# Patient Record
Sex: Male | Born: 1955 | Race: White | Hispanic: No | Marital: Married | Smoking: Former smoker
Health system: Southern US, Community
[De-identification: ages and names within clinical notes are randomized; demographics above are authoritative.]

## PROBLEM LIST (undated history)

## (undated) DIAGNOSIS — G4730 Sleep apnea, unspecified: Secondary | ICD-10-CM

## (undated) DIAGNOSIS — I10 Essential (primary) hypertension: Secondary | ICD-10-CM

## (undated) DIAGNOSIS — E785 Hyperlipidemia, unspecified: Secondary | ICD-10-CM

## (undated) HISTORY — DX: Essential (primary) hypertension: I10

## (undated) HISTORY — PX: COLONOSCOPY: SHX174

## (undated) HISTORY — DX: Hyperlipidemia, unspecified: E78.5

---

## 2006-04-24 ENCOUNTER — Ambulatory Visit: Payer: Self-pay | Admitting: Otolaryngology

## 2011-06-13 ENCOUNTER — Encounter: Payer: Self-pay | Admitting: Internal Medicine

## 2011-06-13 ENCOUNTER — Ambulatory Visit (INDEPENDENT_AMBULATORY_CARE_PROVIDER_SITE_OTHER): Payer: BC Managed Care – PPO | Admitting: Internal Medicine

## 2011-06-13 VITALS — BP 130/72 | HR 72 | Temp 98.5°F | Resp 16 | Ht 73.0 in | Wt 239.5 lb

## 2011-06-13 DIAGNOSIS — E785 Hyperlipidemia, unspecified: Secondary | ICD-10-CM

## 2011-06-13 DIAGNOSIS — E669 Obesity, unspecified: Secondary | ICD-10-CM

## 2011-06-13 DIAGNOSIS — R5383 Other fatigue: Secondary | ICD-10-CM

## 2011-06-13 DIAGNOSIS — M25561 Pain in right knee: Secondary | ICD-10-CM

## 2011-06-13 DIAGNOSIS — Z1239 Encounter for other screening for malignant neoplasm of breast: Secondary | ICD-10-CM

## 2011-06-13 DIAGNOSIS — R5381 Other malaise: Secondary | ICD-10-CM

## 2011-06-13 DIAGNOSIS — M25569 Pain in unspecified knee: Secondary | ICD-10-CM

## 2011-06-13 DIAGNOSIS — I10 Essential (primary) hypertension: Secondary | ICD-10-CM

## 2011-06-13 DIAGNOSIS — Z79899 Other long term (current) drug therapy: Secondary | ICD-10-CM

## 2011-06-13 MED ORDER — FLUTICASONE PROPIONATE 50 MCG/ACT NA SUSP: 2.0000 | Freq: Every day | NASAL | Status: DC

## 2011-06-13 MED ORDER — SIMVASTATIN 20 MG PO TABS: 20.0000 mg | ORAL_TABLET | Freq: Every day | ORAL | Status: DC

## 2011-06-13 MED ORDER — BENAZEPRIL HCL 20 MG PO TABS: 20.0000 mg | ORAL_TABLET | Freq: Every day | ORAL | Status: DC

## 2011-06-13 MED ORDER — MELOXICAM 15 MG PO TABS: 15.0000 mg | ORAL_TABLET | Freq: Every day | ORAL | Status: DC

## 2011-06-13 MED ORDER — MELOXICAM 15 MG PO TABS: 15.0000 mg | ORAL_TABLET | Freq: Every day | ORAL | Status: AC

## 2011-06-13 MED ORDER — ZOLPIDEM TARTRATE 10 MG PO TABS: 10.0000 mg | ORAL_TABLET | Freq: Every evening | ORAL | Status: DC | PRN

## 2011-06-13 NOTE — Assessment & Plan Note (Addendum)
Right knee persistent for a few weeks.,  Some popliteal swelling noted on exam, anteriorly there is no  no large effusion.  wil obtain u/s  To r/o Bakers Cyst and DVT and try NSaIDs   aif no improvement ortho referral to [facility] .

## 2011-06-13 NOTE — Assessment & Plan Note (Signed)
I have addressed  Lipids, BMI and recommended a low glycemic index diet utilizing smaller more frequent meals to increase metabolism.  I have also recommended that patient start exercising with a goal of 30 minutes of aerobic exercise a minimum of 5 days per week. Screening for lipid disorders, thyroid and diabetes to be done today.

## 2011-06-13 NOTE — Progress Notes (Signed)
Patient ID: Walter Osborne, male   DOB: 1956-02-11, 56 y.o.   MRN: [id number]  Patient Active Problem List  Diagnoses  . Knee joint pain, right  . Other and unspecified hyperlipidemia  . Hypertension  . Obesity (BMI 30-39.9)    Subjective:  CC:   Chief Complaint  Patient presents with  . New Patient    HPI:   Walter Osborne a 56 y.o. male who presents for transfer of care from  [facility] for mgmt of hypertension, hyperlipidemia, and sleep apnea.  His OSA was  diagnosed with sleep study  4 or 5 years ago.  Did not tolerate CPAP, but tolerates  BIPAP on room air.  He has had no follow up study but contineus to wear it nightly and rests better with no daytime narcolepsy .  Regarding his blood pressure, he has a home monitor but does not check his bp at home .  He has some persistent right knee pain fore the past 4 weeks,  Made worse by a recent 2 hr ride to [place].  Kneee feels swollen and is tender .      History reviewed. No pertinent past medical history.  History reviewed. No pertinent past surgical history.       The following portions of the patient's history were reviewed and updated as appropriate: Allergies, current medications, and problem list.    Review of Systems:   12 Pt  review of systems was negative except those addressed in the HPI,     History   Social History  . Marital Status: Married    Spouse Name: N/A    Number of Children: N/A  . Years of Education: N/A   Occupational History  .      travels,     Social History Main Topics  . Smoking status: Former Smoker    Types: Cigarettes    Quit date: 02/13/1993  . Smokeless tobacco: Never Used  . Alcohol Use: 4.2 oz/week    7 Glasses of wine per week  . Drug Use: No  . Sexually Active: Not on file   Other Topics Concern  . Not on file   Social History Narrative  . No narrative on file    Objective:  BP 130/72  Pulse 72  Temp(Src) 98.5 F (36.9 C) (Oral)  Resp 16  Ht 6\' 1"   (1.854 m)  Wt 239 lb 8 oz (108.636 kg)  BMI 31.60 kg/m2  SpO2 96%  General appearance: alert, cooperative and appears stated age Ears: normal TM's and external ear canals both ears Throat: lips, mucosa, and tongue normal; teeth and gums normal Neck: no adenopathy, no carotid bruit, supple, symmetrical, trachea midline and thyroid not enlarged, symmetric, no tenderness/mass/nodules Back: symmetric, no curvature. ROM normal. No CVA tenderness. Lungs: clear to auscultation bilaterally Heart: regular rate and rhythm, S1, S2 normal, no murmur, click, rub or gallop Abdomen: soft, non-tender; bowel sounds normal; no masses,  no organomegaly Pulses: 2+ and symmetric Skin: Skin color, texture, turgor normal. No rashes or lesions Lymph nodes: Cervical, supraclavicular, and axillary nodes normal.  Assessment and Plan:  Knee joint pain, right Right knee persistent for a few weeks.,  Some popliteal swelling noted on exam, anteriorly there is no  no large effusion.  wil obtain u/s  To r/o Bakers Cyst and DVT and try NSaIDs   aif no improvement ortho referral to [facility] .  Hypertension Will have him check his bp a few days in a row to confirm  need for additonal meds.   Other and unspecified hyperlipidemia I have addressed  Lipids, BMI and recommended a low glycemic index diet utilizing smaller more frequent meals to increase metabolism.  I have also recommended that patient start exercising with a goal of 30 minutes of aerobic exercise a minimum of 5 days per week. Screening for lipid disorders, thyroid and diabetes to be done today.      Updated Medication List Outpatient Encounter Prescriptions as of 06/13/2011  Medication Sig Dispense Refill  . benazepril (LOTENSIN) 20 MG tablet Take 1 tablet (20 mg total) by mouth daily.  90 tablet  3  . fluticasone (FLONASE) 50 MCG/ACT nasal spray Place 2 sprays into the nose daily.  16 g  5  . meloxicam (MOBIC) 15 MG tablet Take 1 tablet (15 mg total) by  mouth daily.  90 tablet  3  . simvastatin (ZOCOR) 20 MG tablet Take 1 tablet (20 mg total) by mouth at bedtime.  90 tablet  3  . zolpidem (AMBIEN) 10 MG tablet Take 1 tablet (10 mg total) by mouth at bedtime as needed.  30 tablet  3  . DISCONTD: fluticasone (FLONASE) 50 MCG/ACT nasal spray Place 2 sprays into the nose daily.      Marland Kitchen DISCONTD: meloxicam (MOBIC) 15 MG tablet Take 1 tablet (15 mg total) by mouth daily.  30 tablet  1  . DISCONTD: simvastatin (ZOCOR) 10 MG tablet Take 10 mg by mouth at bedtime.      Marland Kitchen DISCONTD: zolpidem (AMBIEN) 10 MG tablet Take 10 mg by mouth at bedtime as needed.         Orders Placed This Encounter  Procedures  . US Venous Img Lower Unilateral Right  . TSH  . Lipid panel  . COMPLETE METABOLIC PANEL WITH GFR  . Ambulatory referral to Gastroenterology    No Follow-up on file.

## 2011-06-13 NOTE — Patient Instructions (Addendum)
Consider the Low Glycemic Index Diet and 6 smaller meals daily .  This boosts your metabolism and regulates your sugars:   7 AM Low carbohydrate Protein  Shakes (EAS Carb Control  Or Atkins ,  Available everywhere,   In  cases at BJs )  2.5 carbs  (Add or substitute a toasted sandwhich thin w/ peanut butter)  10 AM: Protein bar by Atkins (snack size,  Chocolate lover's variety at  BJ's)    Lunch: sandwich on pita bread or flatbread (Joseph's makes a pita bread and a flat bread , available at [employer] and BJ's; Toufayah makes a low carb flatbread available at [employer] and [employer]) Mission makes a low carb whole wheat tortilla available at [employer] most grocery stores   3 PM:  Mid day :  Another protein bar,  Or a  cheese stick, 1/4 cup of almonds, walnuts, pistachios, pecans, peanuts,  Macadamia nuts  6 PM  Dinner:  "mean and green:"  Meat/chicken/fish, salad, and green veggie : use ranch, vinagrette,  Blue cheese, etc  9 PM snack : Breyer's low carb fudgsicle or  ice cream bar (Carb Smart), or  Weight Watcher's ice cream bar , or another protein shake  --------------------------------------------------------------------------------------------------------------------------------------------------------------------------------- Schedule fasting labs at front desk   Return in 6 months for your physical   We will schedule an ultrasound of eight knee to rule out Bakers Cyst and DVT Trial of meloxicam 15 mg daily   and tylenol for pain control (tylenol max 2000 mg daily )   If no improvement in 2-3 weeks,.  Call for ortho referral  GI eval for colonoscopy to be set up by our office

## 2011-06-13 NOTE — Assessment & Plan Note (Signed)
Will have him check his bp a few days in a row to confirm need for additonal meds.

## 2011-06-19 ENCOUNTER — Ambulatory Visit: Payer: Self-pay | Admitting: Internal Medicine

## 2011-06-20 ENCOUNTER — Telehealth: Payer: Self-pay | Admitting: Internal Medicine

## 2011-06-20 NOTE — Telephone Encounter (Signed)
ultrasound was normal of the RLE>

## 2011-06-21 NOTE — Telephone Encounter (Signed)
Patient notified

## 2011-06-23 ENCOUNTER — Other Ambulatory Visit (INDEPENDENT_AMBULATORY_CARE_PROVIDER_SITE_OTHER): Payer: BC Managed Care – PPO | Admitting: *Deleted

## 2011-06-23 DIAGNOSIS — E785 Hyperlipidemia, unspecified: Secondary | ICD-10-CM

## 2011-06-23 DIAGNOSIS — I10 Essential (primary) hypertension: Secondary | ICD-10-CM

## 2011-06-23 LAB — LIPID PANEL
Cholesterol: 175 mg/dL (ref 0–200)
HDL: 48.9 mg/dL (ref >39.00)
Total CHOL/HDL Ratio: 4
Triglycerides: 130 mg/dL (ref 0.0–149.0)

## 2011-06-23 LAB — COMPREHENSIVE METABOLIC PANEL
ALT: 32 U/L (ref 0–53)
BUN: 18 mg/dL (ref 6–23)
CO2: 23 mEq/L (ref 19–32)
Calcium: 8.9 mg/dL (ref 8.4–10.5)
Chloride: 108 mEq/L (ref 96–112)
Creatinine, Ser: 1.1 mg/dL (ref 0.4–1.5)
GFR: 73.19 mL/min (ref >60.00)
Glucose, Bld: 93 mg/dL (ref 70–99)
Total Bilirubin: 0.9 mg/dL (ref 0.3–1.2)

## 2011-06-23 LAB — TSH: TSH: 1.32 u[IU]/mL (ref 0.35–5.50)

## 2011-06-26 ENCOUNTER — Encounter: Payer: Self-pay | Admitting: Internal Medicine

## 2011-06-27 ENCOUNTER — Telehealth: Payer: Self-pay | Admitting: Internal Medicine

## 2011-06-27 NOTE — Telephone Encounter (Signed)
Calling regarding tick bite to waist line, removed 06/26/11, no s/s reported, afebrile but states has quarter size area to tick bite, no rash. Saved tick. Home care given and will call back if s/s occur.

## 2011-06-29 ENCOUNTER — Ambulatory Visit (INDEPENDENT_AMBULATORY_CARE_PROVIDER_SITE_OTHER): Payer: BC Managed Care – PPO | Admitting: Internal Medicine

## 2011-06-29 ENCOUNTER — Encounter: Payer: Self-pay | Admitting: Internal Medicine

## 2011-06-29 VITALS — BP 128/71 | HR 72 | Temp 98.3°F | Resp 16 | Wt 242.0 lb

## 2011-06-29 DIAGNOSIS — T148 Other injury of unspecified body region: Secondary | ICD-10-CM

## 2011-06-29 DIAGNOSIS — G4700 Insomnia, unspecified: Secondary | ICD-10-CM

## 2011-06-29 DIAGNOSIS — W57XXXA Bitten or stung by nonvenomous insect and other nonvenomous arthropods, initial encounter: Secondary | ICD-10-CM

## 2011-06-29 DIAGNOSIS — T148XXA Other injury of unspecified body region, initial encounter: Secondary | ICD-10-CM

## 2011-06-29 MED ORDER — ZOLPIDEM TARTRATE 10 MG PO TABS: 10.0000 mg | ORAL_TABLET | Freq: Every evening | ORAL | Status: DC | PRN

## 2011-06-29 MED ORDER — DOXYCYCLINE HYCLATE 100 MG PO TABS: 100.0000 mg | ORAL_TABLET | Freq: Two times a day (BID) | ORAL | Status: AC

## 2011-06-29 NOTE — Assessment & Plan Note (Signed)
Erythema at site of tick bite is consistent with local reaction and possible early cellulitis. No signs or symptoms to suggest systemic infection such as RMSF. Will treat empirically with doxycycline twice daily for 14 days. Given the extent of bruising, and the patient is on meloxicam, will check platelet count with labs. Patient will call if any fever, chills, malaise, headache, or rash develops.

## 2011-06-29 NOTE — Progress Notes (Signed)
  Subjective:    Patient ID: Walter Osborne, male    DOB: 05-17-1955, 56 y.o.   MRN: [id number]  HPI 56 year old male presents for acute visit complaining of tick bite. He reports that on Monday of this week he noticed a tick over his left lower abdomen. His wife remove the tick. He has subsequently developed some redness at the site. He denies any fever, chills, headache, rash, malaise.  Outpatient Encounter Prescriptions as of 06/29/2011  Medication Sig Dispense Refill  . benazepril (LOTENSIN) 20 MG tablet Take 1 tablet (20 mg total) by mouth daily.  90 tablet  3  . doxycycline (VIBRA-TABS) 100 MG tablet Take 1 tablet (100 mg total) by mouth 2 (two) times daily.  28 tablet  0  . fluticasone (FLONASE) 50 MCG/ACT nasal spray Place 2 sprays into the nose daily.  16 g  5  . meloxicam (MOBIC) 15 MG tablet Take 1 tablet (15 mg total) by mouth daily.  90 tablet  3  . simvastatin (ZOCOR) 20 MG tablet Take 1 tablet (20 mg total) by mouth at bedtime.  90 tablet  3  . zolpidem (AMBIEN) 10 MG tablet Take 1 tablet (10 mg total) by mouth at bedtime as needed.  30 tablet  3  . DISCONTD: zolpidem (AMBIEN) 10 MG tablet Take 1 tablet (10 mg total) by mouth at bedtime as needed.  30 tablet  3    Review of Systems  Constitutional: Negative for fever, chills and fatigue.  Respiratory: Negative for cough and shortness of breath.   Cardiovascular: Negative for chest pain.  Gastrointestinal: Negative for abdominal pain.  Skin: Positive for color change and wound.  Neurological: Negative for headaches.   BP 128/71  Pulse 72  Temp(Src) 98.3 F (36.8 C) (Oral)  Resp 16  Wt 242 lb (109.77 kg)  SpO2 96%     Objective:   Physical Exam  Constitutional: He appears well-developed and well-nourished. No distress.  HENT:  Head: Normocephalic and atraumatic.  Eyes: Conjunctivae and EOM are normal. Pupils are equal, round, and reactive to light.  Neck: Normal range of motion.  Pulmonary/Chest: Effort normal.    Abdominal:    Skin: He is not diaphoretic.          Assessment & Plan:

## 2011-06-30 LAB — CBC WITH DIFFERENTIAL/PLATELET
Basophils Absolute: 0.1 10*3/uL (ref 0.0–0.1)
Eosinophils Relative: 2.2 % (ref 0.0–5.0)
HCT: 42 % (ref 39.0–52.0)
Hemoglobin: 14.4 g/dL (ref 13.0–17.0)
Lymphocytes Relative: 28.6 % (ref 12.0–46.0)
Monocytes Relative: 8.1 % (ref 3.0–12.0)
Neutro Abs: 3.7 10*3/uL (ref 1.4–7.7)
RDW: 13.2 % (ref 11.5–14.6)
WBC: 6.1 10*3/uL (ref 4.5–10.5)

## 2011-10-23 ENCOUNTER — Other Ambulatory Visit: Payer: Self-pay | Admitting: Internal Medicine

## 2011-10-23 DIAGNOSIS — G4700 Insomnia, unspecified: Secondary | ICD-10-CM

## 2011-10-23 MED ORDER — ZOLPIDEM TARTRATE 10 MG PO TABS: 10.0000 mg | ORAL_TABLET | Freq: Every evening | ORAL | Status: DC | PRN

## 2011-11-07 ENCOUNTER — Other Ambulatory Visit: Payer: Self-pay | Admitting: Internal Medicine

## 2011-11-07 DIAGNOSIS — G4700 Insomnia, unspecified: Secondary | ICD-10-CM

## 2011-11-07 MED ORDER — ZOLPIDEM TARTRATE 10 MG PO TABS: 10.0000 mg | ORAL_TABLET | Freq: Every evening | ORAL | Status: DC | PRN

## 2011-11-07 NOTE — Telephone Encounter (Signed)
Refill Ambien 10 mg. Patient does not have any more refills.

## 2011-11-08 ENCOUNTER — Telehealth: Payer: Self-pay | Admitting: Internal Medicine

## 2011-11-08 NOTE — Telephone Encounter (Signed)
Rx was called in yesterday.

## 2011-11-08 NOTE — Telephone Encounter (Signed)
Zolpidem tartrate 10 mg tab #30 take one tablet at bedtime

## 2012-03-27 ENCOUNTER — Other Ambulatory Visit: Payer: Self-pay | Admitting: *Deleted

## 2012-03-27 DIAGNOSIS — Z79899 Other long term (current) drug therapy: Secondary | ICD-10-CM

## 2012-03-27 DIAGNOSIS — G4700 Insomnia, unspecified: Secondary | ICD-10-CM

## 2012-03-27 DIAGNOSIS — Z125 Encounter for screening for malignant neoplasm of prostate: Secondary | ICD-10-CM

## 2012-03-27 DIAGNOSIS — E785 Hyperlipidemia, unspecified: Secondary | ICD-10-CM

## 2012-03-27 DIAGNOSIS — E039 Hypothyroidism, unspecified: Secondary | ICD-10-CM

## 2012-03-28 MED ORDER — ZOLPIDEM TARTRATE 10 MG PO TABS: 10.0000 mg | ORAL_TABLET | Freq: Every evening | ORAL | Status: DC | PRN

## 2012-03-28 NOTE — Telephone Encounter (Signed)
See unrouted note. 30 days on zopdem no refill.s Needs fasting labs prior to any more refills of bp meds and statin, and needs physical scheduled

## 2012-03-28 NOTE — Telephone Encounter (Signed)
30 days only.  He is overdue for his 6 month follow up and fasting labs. It is ow almost time foe his physical, if the physical has to be pushed out he needs to have the fasting labs done prior to any more refills

## 2012-04-01 NOTE — Telephone Encounter (Signed)
Med phoned in °

## 2012-04-22 ENCOUNTER — Ambulatory Visit (INDEPENDENT_AMBULATORY_CARE_PROVIDER_SITE_OTHER): Payer: BC Managed Care – PPO | Admitting: Internal Medicine

## 2012-04-22 ENCOUNTER — Encounter: Payer: Self-pay | Admitting: Internal Medicine

## 2012-04-22 VITALS — BP 118/78 | HR 71 | Temp 98.2°F | Resp 16 | Ht 73.5 in | Wt 243.5 lb

## 2012-04-22 DIAGNOSIS — Z125 Encounter for screening for malignant neoplasm of prostate: Secondary | ICD-10-CM

## 2012-04-22 DIAGNOSIS — E039 Hypothyroidism, unspecified: Secondary | ICD-10-CM

## 2012-04-22 DIAGNOSIS — Z1211 Encounter for screening for malignant neoplasm of colon: Secondary | ICD-10-CM

## 2012-04-22 DIAGNOSIS — Z79899 Other long term (current) drug therapy: Secondary | ICD-10-CM

## 2012-04-22 DIAGNOSIS — E669 Obesity, unspecified: Secondary | ICD-10-CM

## 2012-04-22 DIAGNOSIS — Z0000 Encounter for general adult medical examination without abnormal findings: Secondary | ICD-10-CM

## 2012-04-22 DIAGNOSIS — E785 Hyperlipidemia, unspecified: Secondary | ICD-10-CM

## 2012-04-22 DIAGNOSIS — I10 Essential (primary) hypertension: Secondary | ICD-10-CM

## 2012-04-22 LAB — COMPREHENSIVE METABOLIC PANEL
AST: 22 U/L (ref 0–37)
Albumin: 4.1 g/dL (ref 3.5–5.2)
Alkaline Phosphatase: 62 U/L (ref 39–117)
BUN: 24 mg/dL — ABNORMAL HIGH (ref 6–23)
Glucose, Bld: 80 mg/dL (ref 70–99)
Potassium: 3.8 mEq/L (ref 3.5–5.1)
Sodium: 141 mEq/L (ref 135–145)
Total Bilirubin: 0.6 mg/dL (ref 0.3–1.2)

## 2012-04-22 LAB — LIPID PANEL
Cholesterol: 152 mg/dL (ref 0–200)
LDL Cholesterol: 82 mg/dL (ref 0–99)
Total CHOL/HDL Ratio: 4
Triglycerides: 141 mg/dL (ref 0.0–149.0)
VLDL: 28.2 mg/dL (ref 0.0–40.0)

## 2012-04-22 NOTE — Assessment & Plan Note (Signed)
Well controlled on current regimen. Renal function stable, no changes today. 

## 2012-04-22 NOTE — Assessment & Plan Note (Signed)
I have addressed  BMI and recommended a low glycemic index diet utilizing smaller more frequent meals to increase metabolism.  I have also recommended that patient start exercising with a goal of 30 minutes of aerobic exercise a minimum of 5 days per week.  

## 2012-04-22 NOTE — Assessment & Plan Note (Signed)
Well controlled on current regimen. Liver enzymes are normal, no changes today. 

## 2012-04-22 NOTE — Assessment & Plan Note (Signed)
Annual male exam was done including testicular and prostate exam. PSA is pending .  Colon ca screening was recommended and referral made.  Screening labs done today

## 2012-04-22 NOTE — Patient Instructions (Addendum)
Your Body mass Index is 31.6  (Your goal is < 30.)  Insurance companies are considering rasing insurance rates on all pateitns with BMIs > 30 bc of the impact on overall health that high BMIs have (sleep anea,  Diabetes, hypertension, joint pain)  You can lower your BMI  by losing 10%  Of your current body weight over the next 6 months   This is  One version of a  "Low GI"  Diet:  It is not ultra low carb, but will still lower your blood sugars and allow you to lose 5 to 15lbs per month if you follow it carefully and combine it with 30 minutes of aeroric exercise 5 days per week .   All of the foods can be found at grocery stores and in bulk at [employer].  The Atkins protein bars and shakes are available in more varieties at Target, WalMart and [employer]'s Foods.     7 AM Breakfast:  Low carbohydrate Protein  Shakes (I recommend the EAS AdvantEdge "Carb Control" shakes  Or the low carb shakes by Atkins.   Both are available everywhere:  In  cases at BJs  Or in 4 packs at grocery stores and pharmacies  2.5 carbs  (Alternative is  a toasted Arnold's Sandwhich Thin w/ peanut butter, a "Bagel Thin" with cream cheese and salmon) or  a scrambled egg burrito made with a low carb tortilla .  Avoid cereal and bananas, oatmeal too unless you are cooking the old fashioned kind that takes 30-40 minutes to prepare.  the rest is overly processed, has minimal fiber, and is loaded with carbohydrates!   10 AM: Protein bar by Atkins (the snack size, under 200 cal).  There are many varieties , available widely again or in bulk in limited varieties at BJs)  Other so called "protein bars" tend to be loaded with carbohydrates.  Remember, in food advertising, the word "energy" is synonymous for " carbohydrate."  Lunch: sandwich of [place], (or any lunchmeat, grilled meat or canned tuna), fresh avocado, mayonnaise  and cheese on a lower carbohydrate pita bread, flatbread, or tortilla . Ok to use regular mayonnaise. The bread is the  only source or carbohydrate that can be decreased (Joseph's makes a pita bread and a flat bread that are 50 cal and 4 net carbs ; Toufayan makes a low carb flatbread that's 100 cal and 9 net carbs  and  Mission makes a low carb whole wheat tortilla  That is 210 cal and 6 net carbs)  3 PM:  Mid day :  Another protein bar,  Or a  cheese stick (100 cal, 0 carbs),  Or 1 ounce of  almonds, walnuts, pistachios, pecans, peanuts,  Macadamia nuts. Or a Dannon light n Fit greek yogurt, 80 cal 8 net carbs . Avoid "granola"; the dried cranberries and raisins are loaded with carbohydrates. Mixed nuts ok if no raisins or cranberries or dried fruit.      6 PM  Dinner:  "mean and green:"  Meat/chicken/fish or a high protein legume; , with a green salad, and a low GI  Veggie (broccoli, cauliflower, green beans, spinach, brussel sprouts. Lima beans) : Avoid "Low fat dressings, as well as Reyne Dumas and [address]! They are loaded with sugar! Instead use ranch, vinagrette,  Blue cheese, etc.  There is a low carb pasta by Dreamfield's available at [employer] [facility] that is acceptable and tastes great. Try Michel Angelo's chicken piccata over low carb pasta.  The chicken dish is 0 carbs, and can be found in frozen section at BJs and Lowe's. Also try [employer]" (pulled pork, no sauce,  0 carbs) and his pot roast.   both are in the refrigerated section at BJs   Dreamfield's makes a low carb pasta only 5 g/serving.  Available at all grocery stores,  And tastes like normal pasta  9 PM snack : Breyer's "low carb" fudgsicle or  ice cream bar (Carb Smart line), or  Weight Watcher's ice cream bar , or another "no sugar added" ice cream;a serving of fresh berries/cherries with whipped cream (Avoid bananas, pineapple, grapes  and watermelon on a regular basis because they are high in sugar)   Remember that snack Substitutions should be less than 10 carbs per serving and meals < 20 carbs. Remember to subtract fiber grams  and sugar alcohols to get the "net carbs."

## 2012-04-22 NOTE — Progress Notes (Signed)
Patient ID: Walter Osborne, male   DOB: 1955-11-03, 57 y.o.   MRN: [id number]   The patient is here for his annual male physical examination and management of other chronic and acute problems.   The risk factors are reflected in the social history.  The roster of all physicians providing medical care to patient - is listed in the Snapshot section of the chart.  Activities of daily living:  The patient is 100% independent in all ADLs: dressing, toileting, feeding as well as independent mobility  Home safety : The patient has smoke detectors in the home. He wears seatbelts.  There are no firearms at home. There is no violence in the home.   There is no risks for hepatitis, STDs or HIV. There is no   history of blood transfusion. There is no travel history to infectious disease endemic areas of the world.  The patient has seen their dentist in the last six month and  their eye doctor in the last year.  They do not  have excessive sun exposure. They have seen a dermatoloigist in the last year. Discussed the need for sun protection: hats, long sleeves and use of sunscreen if there is significant sun exposure.   Diet: the importance of a healthy diet is discussed. They do have a healthy diet.  The benefits of regular aerobic exercise were discussed. He exercises twice weekly Depression screen: there are no signs or vegative symptoms of depression- irritability, change in appetite, anhedonia, sadness/tearfullness.  The following portions of the patient's history were reviewed and updated as appropriate: allergies, current medications, past family history, past medical history,  past surgical history, past social history  and problem list.  Visual acuity was not assessed per patient preference since he has regular follow up with his ophthalmologist. Hearing and body mass index were assessed and reviewed.   During the course of the visit the patient was educated and counseled about appropriate screening  and preventive services including :  nutrition counseling, colorectal cancer screening, and recommended immunizations.       Objective  BP 118/78  Pulse 71  Temp(Src) 98.2 F (36.8 C) (Oral)  Resp 16  Ht 6' 1.5" (1.867 m)  Wt 243 lb 8 oz (110.451 kg)  BMI 31.69 kg/m2  SpO2 96%  General Appearance:    Alert, cooperative, no distress, appears stated age  Head:    Normocephalic, without obvious abnormality, atraumatic  Eyes:    PERRL, conjunctiva/corneas clear, EOM's intact, fundi    benign, both eyes       Ears:    Normal TM's and external ear canals, both ears  Nose:   Nares normal, septum midline, mucosa normal, no drainage   or sinus tenderness  Throat:   Lips, mucosa, and tongue normal; teeth and gums normal  Neck:   Supple, symmetrical, trachea midline, no adenopathy;       thyroid:  No enlargement/tenderness/nodules; no carotid   bruit or JVD  Back:     Symmetric, no curvature, ROM normal, no CVA tenderness  Lungs:     Clear to auscultation bilaterally, respirations unlabored  Chest wall:    No tenderness or deformity  Heart:    Regular rate and rhythm, S1 and S2 normal, no murmur, rub   or gallop  Abdomen:     Soft, non-tender, bowel sounds active all four quadrants,    no masses, no organomegaly  Genitalia:    Normal male without lesion, discharge or tenderness  Extremities:  Extremities normal, atraumatic, no cyanosis or edema  Pulses:   2+ and symmetric all extremities  Skin:   Erythematous nonpruritic rash left inguinal area Skin color, texture, turgor normal, no rashes or lesions  Lymph nodes:   Cervical, supraclavicular, and axillary nodes normal  Neurologic:   CNII-XII intact. Normal strength, sensation and reflexes      throughout    Assessment and Plan  Routine general medical examination at a health care facility Annual male exam was done including testicular and prostate exam. PSA is pending .  Colon ca screening was recommended and referral made.   Screening labs done today   Hypertension Well controlled on current regimen. Renal function stable, no changes today.  Hyperlipidemia Well controlled on current regimen. Liver enzymes are normal, no changes today.  Obesity (BMI 30-39.9) I have addressed  BMI and recommended a low glycemic index diet utilizing smaller more frequent meals to increase metabolism.  I have also recommended that patient start exercising with a goal of 30 minutes of aerobic exercise a minimum of 5 days per week.     Updated Medication List Outpatient Encounter Prescriptions as of 04/22/2012  Medication Sig Dispense Refill  . benazepril (LOTENSIN) 20 MG tablet Take 1 tablet (20 mg total) by mouth daily.  90 tablet  3  . fluticasone (FLONASE) 50 MCG/ACT nasal spray Place 2 sprays into the nose daily.  16 g  5  . Loratadine (ALAVERT PO) Take by mouth. 1 dissolvable tablet as needed daily for allergies.      . meloxicam (MOBIC) 15 MG tablet Take 1 tablet (15 mg total) by mouth daily.  90 tablet  3  . simvastatin (ZOCOR) 20 MG tablet Take 1 tablet (20 mg total) by mouth at bedtime.  90 tablet  3  . zolpidem (AMBIEN) 10 MG tablet Take 1 tablet (10 mg total) by mouth at bedtime as needed.  30 tablet  0   No facility-administered encounter medications on file as of 04/22/2012.

## 2012-04-25 ENCOUNTER — Other Ambulatory Visit: Payer: Self-pay | Admitting: *Deleted

## 2012-04-25 DIAGNOSIS — G4700 Insomnia, unspecified: Secondary | ICD-10-CM

## 2012-04-26 MED ORDER — FLUTICASONE PROPIONATE 50 MCG/ACT NA SUSP: 2.0000 | Freq: Every day | NASAL | Status: DC

## 2012-04-26 MED ORDER — ZOLPIDEM TARTRATE 10 MG PO TABS: 10.0000 mg | ORAL_TABLET | Freq: Every evening | ORAL | Status: DC | PRN

## 2012-04-26 NOTE — Telephone Encounter (Signed)
Rx phoned in.   

## 2012-04-26 NOTE — Telephone Encounter (Signed)
Ok to refill, am bien please call in  Authorized in epic

## 2012-05-24 ENCOUNTER — Other Ambulatory Visit: Payer: Self-pay | Admitting: *Deleted

## 2012-05-24 DIAGNOSIS — G4700 Insomnia, unspecified: Secondary | ICD-10-CM

## 2012-05-26 MED ORDER — ZOLPIDEM TARTRATE 10 MG PO TABS: 10.0000 mg | ORAL_TABLET | Freq: Every evening | ORAL | Status: DC | PRN

## 2012-05-26 NOTE — Telephone Encounter (Signed)
Ok to refill,  Authorized in epic. Please call in  

## 2012-05-27 NOTE — Telephone Encounter (Signed)
Script called in as instructed

## 2012-08-28 ENCOUNTER — Telehealth: Payer: Self-pay | Admitting: Internal Medicine

## 2012-08-28 NOTE — Telephone Encounter (Signed)
Refill Request  Benazepril 20 mg   #90  Take one tablet by mouth every day

## 2012-08-28 NOTE — Telephone Encounter (Signed)
Refill Request  Simvastatin 20 mg   #90  Take one tablet by mouth at bedtime

## 2012-08-28 NOTE — Telephone Encounter (Signed)
Pt needing refill on B/P meds Pt uses [facility] on [address].

## 2012-08-29 ENCOUNTER — Telehealth: Payer: Self-pay | Admitting: Internal Medicine

## 2012-08-29 MED ORDER — SIMVASTATIN 20 MG PO TABS: 20.0000 mg | ORAL_TABLET | Freq: Every day | ORAL | Status: DC

## 2012-08-29 MED ORDER — BENAZEPRIL HCL 20 MG PO TABS: 20.0000 mg | ORAL_TABLET | Freq: Every day | ORAL | Status: DC

## 2012-08-29 NOTE — Telephone Encounter (Signed)
Reordered BP Med but was Simvastatin D/C, last lipid normal 3/14 please advise.

## 2012-08-29 NOTE — Telephone Encounter (Signed)
The patient is completely out of his blood pressure medication and needing a refill today.

## 2012-08-29 NOTE — Telephone Encounter (Signed)
error 

## 2012-08-29 NOTE — Telephone Encounter (Signed)
Refills sent

## 2012-08-29 NOTE — Telephone Encounter (Signed)
i see no evidence that I dc'd it ,  To the contrary per office note, but apparently EPIC dc's the medication from the list if the refill date passes without a refill? Refill authorized

## 2012-11-29 ENCOUNTER — Other Ambulatory Visit: Payer: Self-pay | Admitting: Internal Medicine

## 2012-11-29 DIAGNOSIS — Z79899 Other long term (current) drug therapy: Secondary | ICD-10-CM

## 2012-11-29 DIAGNOSIS — E785 Hyperlipidemia, unspecified: Secondary | ICD-10-CM

## 2012-11-29 NOTE — Telephone Encounter (Signed)
Okay to refill? 

## 2012-11-29 NOTE — Telephone Encounter (Signed)
Refill one 30 days only.  He is overdue for 6 month follow up on hypertension and lipid management and needs fasting labs prior to visit. Marland Kitchen

## 2012-12-04 ENCOUNTER — Encounter: Payer: Self-pay | Admitting: *Deleted

## 2012-12-04 NOTE — Telephone Encounter (Signed)
Mailed pt a letter & asked him to call office for OV & lab appt for any additional refills

## 2012-12-09 ENCOUNTER — Encounter (INDEPENDENT_AMBULATORY_CARE_PROVIDER_SITE_OTHER): Payer: Self-pay

## 2012-12-09 ENCOUNTER — Other Ambulatory Visit (INDEPENDENT_AMBULATORY_CARE_PROVIDER_SITE_OTHER): Payer: BC Managed Care – PPO

## 2012-12-09 DIAGNOSIS — E785 Hyperlipidemia, unspecified: Secondary | ICD-10-CM

## 2012-12-09 DIAGNOSIS — Z79899 Other long term (current) drug therapy: Secondary | ICD-10-CM

## 2012-12-09 LAB — LIPID PANEL
Cholesterol: 218 mg/dL — ABNORMAL HIGH (ref 0–200)
Triglycerides: 140 mg/dL (ref 0.0–149.0)

## 2012-12-09 LAB — COMPREHENSIVE METABOLIC PANEL
Albumin: 4.1 g/dL (ref 3.5–5.2)
BUN: 17 mg/dL (ref 6–23)
Calcium: 9 mg/dL (ref 8.4–10.5)
Chloride: 106 mEq/L (ref 96–112)
Creatinine, Ser: 1.2 mg/dL (ref 0.4–1.5)
GFR: 68.5 mL/min (ref >60.00)
Glucose, Bld: 105 mg/dL — ABNORMAL HIGH (ref 70–99)
Potassium: 4.4 mEq/L (ref 3.5–5.1)

## 2012-12-11 ENCOUNTER — Encounter: Payer: Self-pay | Admitting: *Deleted

## 2012-12-13 ENCOUNTER — Encounter: Payer: Self-pay | Admitting: *Deleted

## 2012-12-16 ENCOUNTER — Encounter: Payer: Self-pay | Admitting: Internal Medicine

## 2012-12-16 ENCOUNTER — Ambulatory Visit (INDEPENDENT_AMBULATORY_CARE_PROVIDER_SITE_OTHER): Payer: BC Managed Care – PPO | Admitting: Internal Medicine

## 2012-12-16 VITALS — BP 138/88 | HR 69 | Temp 98.5°F | Resp 12 | Wt 241.0 lb

## 2012-12-16 DIAGNOSIS — I10 Essential (primary) hypertension: Secondary | ICD-10-CM

## 2012-12-16 DIAGNOSIS — Z23 Encounter for immunization: Secondary | ICD-10-CM

## 2012-12-16 DIAGNOSIS — E785 Hyperlipidemia, unspecified: Secondary | ICD-10-CM

## 2012-12-16 DIAGNOSIS — Z79899 Other long term (current) drug therapy: Secondary | ICD-10-CM

## 2012-12-16 DIAGNOSIS — G4733 Obstructive sleep apnea (adult) (pediatric): Secondary | ICD-10-CM

## 2012-12-16 MED ORDER — ZOLPIDEM TARTRATE 10 MG PO TABS: ORAL_TABLET | ORAL | Status: DC

## 2012-12-16 MED ORDER — TETANUS-DIPHTH-ACELL PERTUSSIS 5-2.5-18.5 LF-MCG/0.5 IM SUSP: 0.5000 mL | Freq: Once | INTRAMUSCULAR | Status: DC

## 2012-12-16 MED ORDER — ATORVASTATIN CALCIUM 40 MG PO TABS: 40.0000 mg | ORAL_TABLET | Freq: Every day | ORAL | Status: DC

## 2012-12-16 NOTE — Assessment & Plan Note (Addendum)
.  Diagnosed by sleep study. he is wearing her BIPAP every night a minimum of 6 hours per night.

## 2012-12-16 NOTE — Patient Instructions (Addendum)
Changing statIn from simvastatin to atorvastatin to lower your risk for Coronary artery disease.   RETURN FOR FASTING LABS PRIOR TO NEXT REFILL (within 90 days)   Your need your tetanus-diptheria-pertussis vaccine (TDaP) but you can get it for less $$$ at a local pharmacy with the script I have provided you.    Your blood pressure is elevated today.   Please recheck bp at home a minimum of 5 times over the next 4 weeks and call readings to office or e mail them to me  for adjustment of medications.

## 2012-12-16 NOTE — Assessment & Plan Note (Addendum)
Elevated despite use of simvastatin  His risk of CAD using Framingham risk calculator is 20% using current fasting lipids (on simvastatin).  We discussed  Changing to a more potent statin.  Generic atorvastatin,  Repeat lipids in 3 months

## 2012-12-16 NOTE — Assessment & Plan Note (Signed)
Elevated today.  Reviewed list of meds, patient is not taking OTC meds that could be causing,. It.  Have asked patient to recheck bp at home a minimum of 5 times over the next 4 weeks and call readings to office for adjustment of medications.   

## 2012-12-16 NOTE — Progress Notes (Signed)
Patient ID: Walter Osborne, male   DOB: September 23, 1955, 57 y.o.   MRN: [id number]  Patient Active Problem List   Diagnosis Date Noted  . OSA on CPAP 12/16/2012  . Routine general medical examination at a health care facility 04/22/2012  . Knee joint pain, right 06/13/2011  . Hypertension 06/13/2011  . Obesity (BMI 30-39.9) 06/13/2011  . Hyperlipidemia     Subjective:  CC:   Chief Complaint  Patient presents with  . Follow-up    HPI:   Walter Osborne a 56 y.o. male who presents for follow up on hypertension and hyperlipidemia. He has been taking his medications wihout side effects. Exercising regularly . No new complaints. Wearing his CPAP every night. ,  Sleeps at least 6 hours per night, cleaning  it regularly , fatigue has improved.  Diet reviewed, is healthy,  Uses treadmill twice per week.   Past Medical History  Diagnosis Date  . Hyperlipidemia   . Hypertension     History reviewed. No pertinent past surgical history.     The following portions of the patient's history were reviewed and updated as appropriate: Allergies, current medications, and problem list.    Review of Systems:   12 Pt  review of systems was negative except those addressed in the HPI,     History   Social History  . Marital Status: Married    Spouse Name: N/A    Number of Children: N/A  . Years of Education: N/A   Occupational History  .      travels,     Social History Main Topics  . Smoking status: Former Smoker    Types: Cigarettes    Quit date: 02/13/1993  . Smokeless tobacco: Never Used  . Alcohol Use: 4.2 oz/week    7 Glasses of wine per week  . Drug Use: No  . Sexual Activity: Not on file   Other Topics Concern  . Not on file   Social History Narrative  . No narrative on file    Objective:  Filed Vitals:   12/16/12 1607  BP: 138/88  Pulse: 69  Temp: 98.5 F (36.9 C)  Resp: 12     General appearance: alert, cooperative and appears stated age Ears: normal  TM's and external ear canals both ears Throat: lips, mucosa, and tongue normal; teeth and gums normal Neck: no adenopathy, no carotid bruit, supple, symmetrical, trachea midline and thyroid not enlarged, symmetric, no tenderness/mass/nodules Back: symmetric, no curvature. ROM normal. No CVA tenderness. Lungs: clear to auscultation bilaterally Heart: regular rate and rhythm, S1, S2 normal, no murmur, click, rub or gallop Abdomen: soft, non-tender; bowel sounds normal; no masses,  no organomegaly Pulses: 2+ and symmetric Skin: Skin color, texture, turgor normal. No rashes or lesions Lymph nodes: Cervical, supraclavicular, and axillary nodes normal.  Assessment and Plan:  Hyperlipidemia Elevated despite use of simvastatin  His risk of CAD using Framingham risk calculator is 20% using current fasting lipids (on simvastatin).  We discussed  Changing to a more potent statin.  Generic atorvastatin,  Repeat lipids in 3 months   Hypertension Elevated today.  Reviewed list of meds, patient is not taking OTC meds that could be causing,. It.  Have asked patient to recheck bp at home a minimum of 5 times over the next 4 weeks and call readings to office for adjustment of medications.    OSA on CPAP .Diagnosed by sleep study. he is wearing her BIPAP every night a minimum of 6 hours per  night.    Updated Medication List Outpatient Encounter Prescriptions as of 12/16/2012  Medication Sig  . benazepril (LOTENSIN) 20 MG tablet Take 1 tablet (20 mg total) by mouth daily.  . fluticasone (FLONASE) 50 MCG/ACT nasal spray USE 2 SPRAYS EACH NOSTRIL DAILY  . Loratadine (ALAVERT PO) Take by mouth. 1 dissolvable tablet as needed daily for allergies.  Marland Kitchen zolpidem (AMBIEN) 10 MG tablet TAKE ONE (1) TABLET AT BEDTIME  . [DISCONTINUED] simvastatin (ZOCOR) 20 MG tablet Take 1 tablet (20 mg total) by mouth at bedtime.  . [DISCONTINUED] zolpidem (AMBIEN) 10 MG tablet TAKE ONE (1) TABLET AT BEDTIME  . atorvastatin  (LIPITOR) 40 MG tablet Take 1 tablet (40 mg total) by mouth daily.  . TDaP (BOOSTRIX) 5-2.5-18.5 LF-MCG/0.5 injection Inject 0.5 mLs into the muscle once.     Orders Placed This Encounter  Procedures  . Lipid panel  . Comprehensive metabolic panel    No Follow-up on file.

## 2012-12-30 ENCOUNTER — Other Ambulatory Visit: Payer: Self-pay | Admitting: Internal Medicine

## 2013-01-06 ENCOUNTER — Other Ambulatory Visit: Payer: BC Managed Care – PPO

## 2013-02-21 ENCOUNTER — Encounter: Payer: Self-pay | Admitting: Internal Medicine

## 2013-02-21 ENCOUNTER — Ambulatory Visit (INDEPENDENT_AMBULATORY_CARE_PROVIDER_SITE_OTHER): Payer: BC Managed Care – PPO | Admitting: Internal Medicine

## 2013-02-21 VITALS — BP 130/84 | HR 65 | Temp 98.0°F | Wt 238.2 lb

## 2013-02-21 DIAGNOSIS — J209 Acute bronchitis, unspecified: Secondary | ICD-10-CM

## 2013-02-21 MED ORDER — HYDROCODONE-HOMATROPINE 5-1.5 MG/5ML PO SYRP: 5.0000 mL | ORAL_SOLUTION | Freq: Three times a day (TID) | ORAL | Status: DC | PRN

## 2013-02-21 MED ORDER — AZITHROMYCIN 250 MG PO TABS
ORAL_TABLET | ORAL | Status: DC
Start: 2013-02-21 — End: 2013-07-21

## 2013-02-21 NOTE — Progress Notes (Signed)
Pre-visit discussion using our clinic review tool. No additional management support is needed unless otherwise documented below in the visit note.  

## 2013-02-21 NOTE — Patient Instructions (Signed)
Acute Bronchitis Bronchitis is inflammation of the airways that extend from the windpipe into the lungs (bronchi). The inflammation often causes mucus to develop. This leads to a cough, which is the most common symptom of bronchitis.  In acute bronchitis, the condition usually develops suddenly and goes away over time, usually in a couple weeks. Smoking, allergies, and asthma can make bronchitis worse. Repeated episodes of bronchitis may cause further lung problems.  CAUSES Acute bronchitis is most often caused by the same virus that causes a cold. The virus can spread from person to person (contagious).  SIGNS AND SYMPTOMS   Cough.   Fever.   Coughing up mucus.   Body aches.   Chest congestion.   Chills.   Shortness of breath.   Sore throat.  DIAGNOSIS  Acute bronchitis is usually diagnosed through a physical exam. Tests, such as chest X-rays, are sometimes done to rule out other conditions.  TREATMENT  Acute bronchitis usually goes away in a couple weeks. Often times, no medical treatment is necessary. Medicines are sometimes given for relief of fever or cough. Antibiotics are usually not needed but may be prescribed in certain situations. In some cases, an inhaler may be recommended to help reduce shortness of breath and control the cough. A cool mist vaporizer may also be used to help thin bronchial secretions and make it easier to clear the chest.  HOME CARE INSTRUCTIONS  Get plenty of rest.   Drink enough fluids to keep your urine clear or pale yellow (unless you have a medical condition that requires fluid restriction). Increasing fluids may help thin your secretions and will prevent dehydration.   Only take over-the-counter or prescription medicines as directed by your health care provider.   Avoid smoking and secondhand smoke. Exposure to cigarette smoke or irritating chemicals will make bronchitis worse. If you are a smoker, consider using nicotine gum or skin  patches to help control withdrawal symptoms. Quitting smoking will help your lungs heal faster.   Reduce the chances of another bout of acute bronchitis by washing your hands frequently, avoiding people with cold symptoms, and trying not to touch your hands to your mouth, nose, or eyes.   Follow up with your health care provider as directed.  SEEK MEDICAL CARE IF: Your symptoms do not improve after 1 week of treatment.  SEEK IMMEDIATE MEDICAL CARE IF:  You develop an increased fever or chills.   You have chest pain.   You have severe shortness of breath.  You have bloody sputum.   You develop dehydration.  You develop fainting.  You develop repeated vomiting.  You develop a severe headache. MAKE SURE YOU:   Understand these instructions.  Will watch your condition.  Will get help right away if you are not doing well or get worse. Document Released: 03/09/2004 Document Revised: 10/02/2012 Document Reviewed: 07/23/2012 Exit[employer] Patient Information 2014 [employer].  

## 2013-02-21 NOTE — Progress Notes (Signed)
HPI  Pt presents to the clinic today with c/o cough and chills. This started 1 week ago. The cough is productive of thick green mucous. The cough is worse at night. He has had coughing fits. He also c/o nasal congestion, sore throat and headache. He has tried Theraflu OTC. He has had sick contacts. He has no history of allergies or breathing problems. He does not smoke. He did get his flu shot.  Review of Systems      Past Medical History  Diagnosis Date  . Hyperlipidemia   . Hypertension     Family History  Problem Relation Age of Onset  . Hyperlipidemia Mother   . Diabetes Father   . Cancer Son     Lung CA    History   Social History  . Marital Status: Married    Spouse Name: N/A    Number of Children: N/A  . Years of Education: N/A   Occupational History  .      travels,     Social History Main Topics  . Smoking status: Former Smoker    Types: Cigarettes    Quit date: 02/13/1993  . Smokeless tobacco: Never Used  . Alcohol Use: 4.2 oz/week    7 Glasses of wine per week  . Drug Use: No  . Sexual Activity: Not on file   Other Topics Concern  . Not on file   Social History Narrative  . No narrative on file    No Known Allergies   Constitutional: Positive headache, fatigue and fever. Denies abrupt weight changes.  HEENT:  Positive sore throat. Denies eye redness, eye pain, pressure behind the eyes, facial pain, nasal congestion, ear pain, ringing in the ears, wax buildup, runny nose or bloody nose. Respiratory: Positive cough. Denies difficulty breathing or shortness of breath.  Cardiovascular: Denies chest pain, chest tightness, palpitations or swelling in the hands or feet.   No other specific complaints in a complete review of systems (except as listed in HPI above).  Objective:   BP 130/84  Pulse 65  Temp(Src) 98 F (36.7 C) (Oral)  Wt 238 lb 4 oz (108.069 kg)  SpO2 98% Wt Readings from Last 3 Encounters:  02/21/13 238 lb 4 oz (108.069 kg)   12/16/12 241 lb (109.317 kg)  04/22/12 243 lb 8 oz (110.451 kg)     General: Appears his stated age, well developed, well nourished in NAD. HEENT: Head: normal shape and size; Eyes: sclera white, no icterus, conjunctiva pink, PERRLA and EOMs intact; Ears: Tm's gray and intact, normal light reflex; Nose: mucosa pink and moist, septum midline; Throat/Mouth: + PND. Teeth present, mucosa erythematous and moist, no exudate noted, no lesions or ulcerations noted.  Neck: Mild cervical lymphadenopathy. Neck supple, trachea midline. No massses, lumps or thyromegaly present.  Cardiovascular: Normal rate and rhythm. S1,S2 noted.  No murmur, rubs or gallops noted. No JVD or BLE edema. No carotid bruits noted. Pulmonary/Chest: Normal effort and positive vesicular breath sounds. No respiratory distress. No wheezes, rales or ronchi noted.      Assessment & Plan:   Acute Bronchitis  Get some rest and drink plenty of water Do salt water gargles for the sore throat eRx for Azithromax x 5 days eRx for Hycodan cough syrup  RTC as needed or if symptoms persist.

## 2013-02-24 ENCOUNTER — Other Ambulatory Visit (INDEPENDENT_AMBULATORY_CARE_PROVIDER_SITE_OTHER): Payer: BC Managed Care – PPO

## 2013-02-24 DIAGNOSIS — Z79899 Other long term (current) drug therapy: Secondary | ICD-10-CM

## 2013-02-24 DIAGNOSIS — E785 Hyperlipidemia, unspecified: Secondary | ICD-10-CM

## 2013-02-24 LAB — LIPID PANEL
CHOL/HDL RATIO: 4
Cholesterol: 137 mg/dL (ref 0–200)
HDL: 36.4 mg/dL — ABNORMAL LOW (ref >39.00)
LDL CALC: 80 mg/dL (ref 0–99)
Triglycerides: 105 mg/dL (ref 0.0–149.0)
VLDL: 21 mg/dL (ref 0.0–40.0)

## 2013-02-24 LAB — COMPREHENSIVE METABOLIC PANEL
ALBUMIN: 3.9 g/dL (ref 3.5–5.2)
ALT: 27 U/L (ref 0–53)
AST: 17 U/L (ref 0–37)
Alkaline Phosphatase: 55 U/L (ref 39–117)
BUN: 17 mg/dL (ref 6–23)
CO2: 27 mEq/L (ref 19–32)
Calcium: 9.4 mg/dL (ref 8.4–10.5)
Chloride: 109 mEq/L (ref 96–112)
Creatinine, Ser: 1.1 mg/dL (ref 0.4–1.5)
GFR: 71.26 mL/min (ref >60.00)
Glucose, Bld: 101 mg/dL — ABNORMAL HIGH (ref 70–99)
POTASSIUM: 4.4 meq/L (ref 3.5–5.1)
Sodium: 142 mEq/L (ref 135–145)
Total Bilirubin: 0.8 mg/dL (ref 0.3–1.2)
Total Protein: 6.7 g/dL (ref 6.0–8.3)

## 2013-02-27 ENCOUNTER — Encounter: Payer: Self-pay | Admitting: *Deleted

## 2013-05-29 ENCOUNTER — Other Ambulatory Visit: Payer: Self-pay | Admitting: Internal Medicine

## 2013-05-29 NOTE — Telephone Encounter (Signed)
Electronic Rx request, please advise. 

## 2013-05-29 NOTE — Telephone Encounter (Signed)
Refill one 30 days only.  Needs 6 month follow up ni May prior to any more refills

## 2013-07-02 ENCOUNTER — Emergency Department: Payer: Self-pay | Admitting: Emergency Medicine

## 2013-07-03 NOTE — Telephone Encounter (Signed)
Patient notified and appointment made

## 2013-07-21 ENCOUNTER — Ambulatory Visit (INDEPENDENT_AMBULATORY_CARE_PROVIDER_SITE_OTHER): Payer: BC Managed Care – PPO | Admitting: Internal Medicine

## 2013-07-21 ENCOUNTER — Encounter: Payer: Self-pay | Admitting: Internal Medicine

## 2013-07-21 ENCOUNTER — Encounter (INDEPENDENT_AMBULATORY_CARE_PROVIDER_SITE_OTHER): Payer: Self-pay

## 2013-07-21 VITALS — BP 132/88 | HR 64 | Resp 14 | Ht 73.5 in | Wt 238.5 lb

## 2013-07-21 DIAGNOSIS — E785 Hyperlipidemia, unspecified: Secondary | ICD-10-CM

## 2013-07-21 DIAGNOSIS — Z9989 Dependence on other enabling machines and devices: Principal | ICD-10-CM

## 2013-07-21 DIAGNOSIS — Z1211 Encounter for screening for malignant neoplasm of colon: Secondary | ICD-10-CM

## 2013-07-21 DIAGNOSIS — Z79899 Other long term (current) drug therapy: Secondary | ICD-10-CM

## 2013-07-21 DIAGNOSIS — I10 Essential (primary) hypertension: Secondary | ICD-10-CM

## 2013-07-21 DIAGNOSIS — E669 Obesity, unspecified: Secondary | ICD-10-CM

## 2013-07-21 DIAGNOSIS — M25429 Effusion, unspecified elbow: Secondary | ICD-10-CM

## 2013-07-21 DIAGNOSIS — M25422 Effusion, left elbow: Secondary | ICD-10-CM

## 2013-07-21 DIAGNOSIS — L989 Disorder of the skin and subcutaneous tissue, unspecified: Secondary | ICD-10-CM

## 2013-07-21 DIAGNOSIS — G4733 Obstructive sleep apnea (adult) (pediatric): Secondary | ICD-10-CM

## 2013-07-21 LAB — COMPREHENSIVE METABOLIC PANEL
ALT: 40 U/L (ref 0–53)
AST: 23 U/L (ref 0–37)
Albumin: 4.3 g/dL (ref 3.5–5.2)
Alkaline Phosphatase: 61 U/L (ref 39–117)
BUN: 19 mg/dL (ref 6–23)
CO2: 29 meq/L (ref 19–32)
CREATININE: 1.1 mg/dL (ref 0.4–1.5)
Calcium: 9 mg/dL (ref 8.4–10.5)
Chloride: 106 mEq/L (ref 96–112)
GFR: 75.78 mL/min (ref >60.00)
GLUCOSE: 95 mg/dL (ref 70–99)
POTASSIUM: 4.1 meq/L (ref 3.5–5.1)
Sodium: 139 mEq/L (ref 135–145)
Total Bilirubin: 0.8 mg/dL (ref 0.2–1.2)
Total Protein: 7.1 g/dL (ref 6.0–8.3)

## 2013-07-21 LAB — MICROALBUMIN / CREATININE URINE RATIO
CREATININE, U: 61.5 mg/dL
MICROALB UR: 0.1 mg/dL (ref 0.0–1.9)
MICROALB/CREAT RATIO: 0.2 mg/g (ref 0.0–30.0)

## 2013-07-21 MED ORDER — BENAZEPRIL-HYDROCHLOROTHIAZIDE 20-12.5 MG PO TABS: 1.0000 | ORAL_TABLET | Freq: Every day | ORAL | Status: DC

## 2013-07-21 MED ORDER — ZOLPIDEM TARTRATE 10 MG PO TABS: ORAL_TABLET | ORAL | Status: DC

## 2013-07-21 NOTE — Patient Instructions (Signed)
Referrals to Dermatology, colonoscopy and repeat sleep study are in process  I have added HCTZ to your benazepril for better BP control.  Goal is 130/80 or less  Please return in a month for fasting labs. (appt for lab needed)  I would like you to lose 23 lbs (10%) of body weight  Over the next year.  This is  One version of a  "Low GI"  Diet:  It will still lower your blood sugars and allow you to lose 4 to 8  lbs  per month if you follow it carefully.  Your goal with exercise is a minimum of 30 minutes of aerobic exercise 5 days per week (Walking does not count once it becomes easy!)    All of the foods can be found at grocery stores and in bulk at [employer].  The Atkins protein bars and shakes are available in more varieties at Target, [employer] and [place].     7 AM Breakfast:  Choose from the following:  Low carbohydrate Protein  Shakes (I recommend the EAS AdvantEdge "Carb Control" shakes  Or the low carb shakes by Atkins.    2.5 carbs   Arnold's "Sandwhich Thin"toasted  w/ peanut butter (no jelly: about 20 net carbs  "Bagel Thin" with cream cheese and salmon: about 20 carbs   a scrambled egg/bacon/cheese burrito made with [place]'s "carb balance" whole wheat tortilla  (about 10 net carbs )   Avoid cereal and bananas, oatmeal and cream of wheat and grits. They are loaded with carbohydrates!   10 AM: high protein snack  Protein bar by Atkins (the snack size, under 200 cal, usually < 6 net carbs).    A stick of cheese:  Around 1 carb,  100 cal     Dannon Light n Fit [place] Yogurt  (80 cal, 8 carbs)  Other so called "protein bars" and Greek yogurts tend to be loaded with carbohydrates.  Remember, in food advertising, the word "energy" is synonymous for " carbohydrate."  Lunch:   A Sandwich using the bread choices listed, Can use any  Eggs,  lunchmeat, grilled meat or canned tuna), avocado, regular mayo/mustard  and cheese.  A Salad using blue cheese, ranch,  Goddess or vinagrette,   No croutons or "confetti" and no "candied nuts" but regular nuts OK.   No pretzels or chips.  Pickles and miniature sweet peppers are a good low carb alternative that provide a "crunch"  The bread is the only source of carbohydrate in a sandwich and  can be decreased by trying some of these alternatives to traditional loaf bread  Joseph's makes a pita bread and a flat bread that are 50 cal and 4 net carbs available at [place] and [place].  This can be toasted to use with hummous as well  Toufayan makes a low carb flatbread that's 100 cal and 9 net carbs available at [employer] and [employer]'s makes 2 sizes of  Low carb whole wheat tortilla  (The large one is 210 cal and 6 net carbs) Avoid "Low fat dressings, as well as Barry Brunner and [place] dressings They are loaded with sugar!   3 PM/ Mid day  Snack:  Consider  1 ounce of  almonds, walnuts, pistachios, pecans, peanuts,  Macadamia nuts or a nut medley.  Avoid "granola"; the dried cranberries and raisins are loaded with carbohydrates. Mixed nuts as long as there are no raisins,  cranberries or dried fruit.    Try  the prosciutto/mozzarella cheese sticks by [place]  In deli /backery section   High protein      6 PM  Dinner:     Meat/fowl/fish with a green salad, and either broccoli, cauliflower, green beans, spinach, brussel sprouts or  Lima beans. DO NOT BREAD THE PROTEIN!!      There is a low carb pasta by Dreamfield's that is acceptable and tastes great: only 5 digestible carbs/serving.( All grocery stores but BJs carry it )  Try Hurley Cisco Angelo's chicken piccata or chicken or eggplant parm over low carb pasta.(Lowes and BJs)   Marjory Lies Sanchez's "Carnitas" (pulled pork, no sauce,  0 carbs) or his beef pot roast to make a dinner burrito (at BJ's)  Pesto over low carb pasta (bj's sells a good quality pesto in the center refrigerated section of the deli   Try satueeing  Cheral Marker with mushroooms  Whole wheat pasta is still full of digestible  carbs and  Not as low in glycemic index as Dreamfield's.   Brown rice is still rice,  So skip the rice and noodles if you eat [place] or [place] (or at least limit to 1/2 cup)  9 PM snack :   Breyer's "low carb" fudgsicle or  ice cream bar (Carb Smart line), or  Weight Watcher's ice cream bar , or another "no sugar added" ice cream;  a serving of fresh berries/cherries with whipped cream   Cheese or DANNON'S LlGHT N FIT [place] YOGURT  8 ounces of [place] unsweetened almond/cococunut milk    Avoid bananas, pineapple, grapes  and watermelon on a regular basis because they are high in sugar.  THINK OF THEM AS DESSERT  Remember that snack Substitutions should be less than 10 NET carbs per serving and meals < 20 carbs. Remember to subtract fiber grams to get the "net carbs."

## 2013-07-21 NOTE — Assessment & Plan Note (Signed)
Not at goal on current regimen. Renal function stable, adding low dose hctz  Lab Results  Component Value Date   CREATININE 1.1 07/21/2013   Lab Results  Component Value Date   NA 139 07/21/2013   K 4.1 07/21/2013   CL 106 07/21/2013   CO2 29 07/21/2013

## 2013-07-21 NOTE — Assessment & Plan Note (Addendum)
Managed with  statin therapy.   Discussed ways to raise HDL with mediterranean diet  Liver enzymes are normal , no changes today.  Lab Results  Component Value Date   CHOL 137 02/24/2013   HDL 36.40* 02/24/2013   LDLCALC 80 02/24/2013   LDLDIRECT 152.3 12/09/2012   TRIG 105.0 02/24/2013   CHOLHDL 4 02/24/2013   Lab Results  Component Value Date   ALT 40 07/21/2013   AST 23 07/21/2013   ALKPHOS 61 07/21/2013   BILITOT 0.8 07/21/2013

## 2013-07-21 NOTE — Assessment & Plan Note (Addendum)
Has been 8 yrs since last study which was done at [place] [facility]!  He is wearing CPAP a minimum of 6 hours per night.  Uses ambien to relax so he can tolerate the mask,  Repeat sleep study ordered

## 2013-07-21 NOTE — Progress Notes (Signed)
Pre visit review using our clinic review tool, if applicable. No additional management support is needed unless otherwise documented below in the visit note. 

## 2013-07-21 NOTE — Assessment & Plan Note (Signed)
Improving, etiology presumed tendon strain given his history of bruising.

## 2013-07-21 NOTE — Progress Notes (Signed)
Patient ID: Walter Osborne, male   DOB: 27-Oct-1955, 58 y.o.   MRN: [id number]  Patient Active Problem List   Diagnosis Date Noted  . Effusion of left olecranon bursa 07/21/2013  . OSA on CPAP 12/16/2012  . Routine general medical examination at a health care facility 04/22/2012  . Knee joint pain, right 06/13/2011  . Hypertension 06/13/2011  . Obesity (BMI 30-39.9) 06/13/2011  . Hyperlipidemia     Subjective:  CC:   Chief Complaint  Patient presents with  . Follow-up    6 month follow up for medication refills    HPI:   Walter Osborne is a 58 y.o. male who presents for 6 month follow up hypertension,  Hyperlipidemia, and obesity with treated OSA.  He has had a recent ortho evaluation for left sided olecranon bursitis.  Symptoms started with painless ecchymosis of extensor surface of forearm and distal portion of upper arm about 2 weeks ago.  had played his usual round of golf a few days  prior to episode.  No history of overuse, trauma,  recent vaccines or fluoroquinolone use.  3 days later noticed a golfball sized swelling of  elbow.Martin Majestic to ER , told he had bursitis.  Saw [facility]inski for follow up last week , who thought he had a tendon rupture given the bruising.  Strength was normal.  Swelling improved with compression brace to elbow so no aspiration was done.  Patient does not take aspirin or other anti platelet agents,  No history of bruising or gums bleeding.  Has not played golf since. been walking on treadmill twice weekly,   Obesity.  Has been walking on the treadmill for exercise. Not dieting to lose weight.     Wearing his CPAP every night. ,  Sleeps at least 6 hours per night, cleaning  it regularly , fatigue has improved.     Past Medical History  Diagnosis Date  . Hyperlipidemia   . Hypertension     No past surgical history on file.     The following portions of the patient's history were reviewed and updated as appropriate: Allergies, current medications, and  problem list.    Review of Systems:   Patient denies headache, fevers, malaise, unintentional weight loss, skin rash, eye pain, sinus congestion and sinus pain, sore throat, dysphagia,  hemoptysis , cough, dyspnea, wheezing, chest pain, palpitations, orthopnea, edema, abdominal pain, nausea, melena, diarrhea, constipation, flank pain, dysuria, hematuria, urinary  Frequency, nocturia, numbness, tingling, seizures,  Focal weakness, Loss of consciousness,  Tremor, insomnia, depression, anxiety, and suicidal ideation.     History   Social History  . Marital Status: Married    Spouse Name: N/A    Number of Children: N/A  . Years of Education: N/A   Occupational History  .      travels,     Social History Main Topics  . Smoking status: Former Smoker    Types: Cigarettes    Quit date: 02/13/1993  . Smokeless tobacco: Never Used  . Alcohol Use: 4.2 oz/week    7 Glasses of wine per week  . Drug Use: No  . Sexual Activity: Not on file   Other Topics Concern  . Not on file   Social History Narrative  . No narrative on file    Objective:  Filed Vitals:   07/21/13 0840  BP: 132/88  Pulse: 64  Resp: 14     General appearance: alert, cooperative and appears stated age Ears: normal TM's and  external ear canals both ears Throat: lips, mucosa, and tongue normal; teeth and gums normal Neck: no adenopathy, no carotid bruit, supple, symmetrical, trachea midline and thyroid not enlarged, symmetric, no tenderness/mass/nodules Back: symmetric, no curvature. ROM normal. No CVA tenderness. Lungs: clear to auscultation bilaterally Heart: regular rate and rhythm, S1, S2 normal, no murmur, click, rub or gallop Abdomen: soft, non-tender; bowel sounds normal; no masses,  no organomegaly Pulses: 2+ and symmetric Skin: Skin color, texture, turgor normal. No rashes or lesions Lymph nodes: Cervical, supraclavicular, and axillary nodes normal.  Assessment and Plan:  OSA on CPAP Has been 8  yrs since last study which was done at [facility]!  He is wearing CPAP a minimum of 6 hours per night.  Uses ambien to relax so he can tolerate the mask,  Repeat sleep study ordered   Hyperlipidemia Managed with  statin therapy.   Discussed ways to raise HDL with mediterranean diet  Liver enzymes are normal , no changes today.  Lab Results  Component Value Date   CHOL 137 02/24/2013   HDL 36.40* 02/24/2013   LDLCALC 80 02/24/2013   LDLDIRECT 152.3 12/09/2012   TRIG 105.0 02/24/2013   CHOLHDL 4 02/24/2013   Lab Results  Component Value Date   ALT 40 07/21/2013   AST 23 07/21/2013   ALKPHOS 61 07/21/2013   BILITOT 0.8 07/21/2013     Hypertension Not at goal on current regimen. Renal function stable, adding low dose hctz  Lab Results  Component Value Date   CREATININE 1.1 07/21/2013   Lab Results  Component Value Date   NA 139 07/21/2013   K 4.1 07/21/2013   CL 106 07/21/2013   CO2 29 07/21/2013     Obesity (BMI 30-39.9) I have addressed  BMI and recommended a low glycemic index diet utilizing smaller more frequent meals to increase metabolism.  I have also recommended that patient increased his  exercising with a goal of 30 minutes of aerobic exercise a minimum of 5 days per week.    Effusion of left olecranon bursa Improving, etiology presumed tendon strain given his history of bruising.    A total of 25 minutes was spent with patient more than half of which was spent in counseling patinet on diet and exercise, reviewing records from other providers and coordination of care.   Updated Medication List Outpatient Encounter Prescriptions as of 07/21/2013  Medication Sig  . atorvastatin (LIPITOR) 40 MG tablet Take 1 tablet (40 mg total) by mouth daily.  . fluticasone (FLONASE) 50 MCG/ACT nasal spray USE 2 SPRAYS EACH NOSTRIL DAILY  . Loratadine (ALAVERT PO) Take by mouth. 1 dissolvable tablet as needed daily for allergies.  Marland Kitchen zolpidem (AMBIEN) 10 MG tablet TAKE ONE (1) TABLET AT BEDTIME   . [DISCONTINUED] benazepril (LOTENSIN) 20 MG tablet Take 1 tablet (20 mg total) by mouth daily.  . [DISCONTINUED] zolpidem (AMBIEN) 10 MG tablet TAKE ONE (1) TABLET AT BEDTIME  . benazepril-hydrochlorthiazide (LOTENSIN HCT) 20-12.5 MG per tablet Take 1 tablet by mouth daily.  . [DISCONTINUED] azithromycin (ZITHROMAX) 250 MG tablet Take 2 pills today, then 1 tab daily x 4 days  . [DISCONTINUED] HYDROcodone-homatropine (HYCODAN) 5-1.5 MG/5ML syrup Take 5 mLs by mouth every 8 (eight) hours as needed for cough.     Orders Placed This Encounter  Procedures  . Comprehensive metabolic panel  . Microalbumin / creatinine urine ratio  . Ambulatory referral to Sleep Studies  . Ambulatory referral to General Surgery  . Ambulatory referral  to Dermatology    Return in about 6 months (around 01/20/2014).

## 2013-07-21 NOTE — Assessment & Plan Note (Signed)
I have addressed  BMI and recommended a low glycemic index diet utilizing smaller more frequent meals to increase metabolism.  I have also recommended that patient increased his  exercising with a goal of 30 minutes of aerobic exercise a minimum of 5 days per week.

## 2013-07-22 ENCOUNTER — Encounter: Payer: Self-pay | Admitting: *Deleted

## 2013-07-22 ENCOUNTER — Telehealth: Payer: Self-pay | Admitting: Internal Medicine

## 2013-07-22 NOTE — Telephone Encounter (Signed)
Relevant patient education assigned to patient using Emmi. ° °

## 2013-07-30 ENCOUNTER — Other Ambulatory Visit: Payer: Self-pay | Admitting: Internal Medicine

## 2013-08-14 ENCOUNTER — Ambulatory Visit: Payer: Self-pay | Admitting: General Surgery

## 2013-08-14 ENCOUNTER — Encounter: Payer: Self-pay | Admitting: *Deleted

## 2013-08-19 ENCOUNTER — Ambulatory Visit: Payer: Self-pay | Admitting: Orthopedic Surgery

## 2013-08-25 ENCOUNTER — Other Ambulatory Visit: Payer: BC Managed Care – PPO

## 2013-08-25 ENCOUNTER — Telehealth: Payer: Self-pay | Admitting: Internal Medicine

## 2013-08-25 DIAGNOSIS — Z125 Encounter for screening for malignant neoplasm of prostate: Secondary | ICD-10-CM

## 2013-08-25 DIAGNOSIS — E785 Hyperlipidemia, unspecified: Secondary | ICD-10-CM

## 2013-08-25 NOTE — Telephone Encounter (Signed)
The  Patient is coming in tomorrow for lab work before his physical . He is needing lab orders.

## 2013-08-26 ENCOUNTER — Other Ambulatory Visit: Payer: BC Managed Care – PPO

## 2013-08-27 ENCOUNTER — Other Ambulatory Visit: Payer: Self-pay | Admitting: Internal Medicine

## 2013-09-03 ENCOUNTER — Other Ambulatory Visit (INDEPENDENT_AMBULATORY_CARE_PROVIDER_SITE_OTHER): Payer: BC Managed Care – PPO

## 2013-09-03 DIAGNOSIS — E785 Hyperlipidemia, unspecified: Secondary | ICD-10-CM

## 2013-09-03 DIAGNOSIS — Z125 Encounter for screening for malignant neoplasm of prostate: Secondary | ICD-10-CM

## 2013-09-03 LAB — PSA: PSA: 1.99 ng/mL (ref 0.10–4.00)

## 2013-09-03 LAB — LIPID PANEL
CHOL/HDL RATIO: 4
Cholesterol: 164 mg/dL (ref 0–200)
HDL: 45.1 mg/dL (ref >39.00)
LDL CALC: 85 mg/dL (ref 0–99)
NONHDL: 118.9
TRIGLYCERIDES: 172 mg/dL — AB (ref 0.0–149.0)
VLDL: 34.4 mg/dL (ref 0.0–40.0)

## 2013-09-04 ENCOUNTER — Encounter: Payer: Self-pay | Admitting: Internal Medicine

## 2013-09-08 NOTE — Telephone Encounter (Signed)
Mailed unread message to pt  

## 2013-10-15 ENCOUNTER — Encounter: Payer: Self-pay | Admitting: Adult Health

## 2013-10-15 ENCOUNTER — Ambulatory Visit (INDEPENDENT_AMBULATORY_CARE_PROVIDER_SITE_OTHER): Payer: BC Managed Care – PPO | Admitting: Adult Health

## 2013-10-15 VITALS — BP 144/85 | HR 78 | Temp 98.2°F | Resp 14 | Wt 240.2 lb

## 2013-10-15 DIAGNOSIS — J209 Acute bronchitis, unspecified: Secondary | ICD-10-CM

## 2013-10-15 MED ORDER — AZITHROMYCIN 250 MG PO TABS: ORAL_TABLET | ORAL | Status: DC

## 2013-10-15 MED ORDER — GUAIFENESIN-CODEINE 100-10 MG/5ML PO SOLN: 5.0000 mL | Freq: Three times a day (TID) | ORAL | Status: DC | PRN

## 2013-10-15 NOTE — Progress Notes (Signed)
Pre visit review using our clinic review tool, if applicable. No additional management support is needed unless otherwise documented below in the visit note. 

## 2013-10-15 NOTE — Progress Notes (Signed)
Patient ID: Walter Osborne, male   DOB: Apr 08, 1955, 58 y.o.   MRN: [id number]   Subjective:    Patient ID: Walter Osborne, male    DOB: 01-22-1956, 58 y.o.   MRN: [id number]  HPI  Patient is a pleasant 58 year old male who presents to clinic with ongoing cough greater than a week. He has been taking over-the-counter medications such as Mucinex. These have not helped. Cough is keeping him up at night. He is also experiencing significant nasal stuffiness at night. He is on a CPAP machine and the stuffiness wakes him up. He is using his Flonase regularly.  Past Medical History  Diagnosis Date  . Hyperlipidemia   . Hypertension     Current Outpatient Prescriptions on File Prior to Visit  Medication Sig Dispense Refill  . atorvastatin (LIPITOR) 40 MG tablet Take 1 tablet (40 mg total) by mouth daily.  90 tablet  3  . benazepril-hydrochlorthiazide (LOTENSIN HCT) 20-12.5 MG per tablet Take 1 tablet by mouth daily.  90 tablet  2  . fluticasone (FLONASE) 50 MCG/ACT nasal spray USE 2 SPRAYS EACH NOSTRIL DAILY  16 g  3  . Loratadine (ALAVERT PO) Take by mouth. 1 dissolvable tablet as needed daily for allergies.      Marland Kitchen zolpidem (AMBIEN) 10 MG tablet TAKE ONE (1) TABLET AT BEDTIME  30 tablet  0   No current facility-administered medications on file prior to visit.     Review of Systems  Constitutional: Negative for fever and chills.       Malaise  HENT: Congestion: chest congestion. Sore throat: irritation of throat.   Respiratory: Positive for cough.   All other systems reviewed and are negative.      Objective:  BP 144/85  Pulse 78  Temp(Src) 98.2 F (36.8 C) (Oral)  Resp 14  Wt 240 lb 4 oz (108.977 kg)  SpO2 96%   Physical Exam  Constitutional: He is oriented to person, place, and time. He appears well-developed and well-nourished. No distress.  HENT:  Head: Normocephalic and atraumatic.  Eyes: Conjunctivae and EOM are normal.  Neck:  Small, palpable anterior adenopathy    Cardiovascular: Normal rate, regular rhythm, normal heart sounds and intact distal pulses.  Exam reveals no gallop and no friction rub.   No murmur heard. Pulmonary/Chest: Effort normal and breath sounds normal. No respiratory distress. He has no wheezes. He has no rales.  Musculoskeletal: Normal range of motion.  Lymphadenopathy:    He has cervical adenopathy.  Neurological: He is alert and oriented to person, place, and time.  Skin: Skin is warm and dry.  Psychiatric: He has a normal mood and affect. His behavior is normal. Judgment and thought content normal.      Assessment & Plan:   1. Acute bronchitis, unspecified organism Fluids to maintain hydration Robitussin AC for cough as directed Continue flonase. Start Azithromycin May use afrin x 3 days. - azithromycin (ZITHROMAX) 250 MG tablet; Take 2 pills today, then 1 tab daily x 4 days  Dispense: 6 tablet; Refill: 0

## 2013-10-15 NOTE — Patient Instructions (Signed)
  Start azithromycin 2 tablets today then one tablet daily for the next 4 days.  Robitussin-AC for severe cough. This contains codeine and will cause sedation.  Drink plenty of fluids.  Continue Flonase. You may use Afrin at bedtime for 3 days.

## 2013-12-25 ENCOUNTER — Other Ambulatory Visit: Payer: Self-pay | Admitting: Internal Medicine

## 2014-01-13 ENCOUNTER — Other Ambulatory Visit: Payer: Self-pay | Admitting: Internal Medicine

## 2014-01-13 ENCOUNTER — Encounter: Payer: Self-pay | Admitting: Internal Medicine

## 2014-01-13 DIAGNOSIS — G4700 Insomnia, unspecified: Secondary | ICD-10-CM | POA: Insufficient documentation

## 2014-01-13 DIAGNOSIS — G4730 Sleep apnea, unspecified: Principal | ICD-10-CM

## 2014-01-13 MED ORDER — ZOLPIDEM TARTRATE 10 MG PO TABS
10.0000 mg | ORAL_TABLET | Freq: Every evening | ORAL | Status: DC | PRN
Start: 2014-01-13 — End: 2014-01-22

## 2014-01-13 MED ORDER — ATORVASTATIN CALCIUM 40 MG PO TABS: 40.0000 mg | ORAL_TABLET | Freq: Every day | ORAL | Status: DC

## 2014-01-13 NOTE — Telephone Encounter (Signed)
Refill one 30 days only.  Has not been seen in over 6 months for follow up on hypertension and OSA  so needs office visit prior to any more refills

## 2014-01-13 NOTE — Assessment & Plan Note (Signed)
Managed with ambien.  He is due for 6 month follow up.

## 2014-01-13 NOTE — Telephone Encounter (Signed)
Last non acute 07/21/13, last refill 07/30/13 #30

## 2014-01-14 NOTE — Telephone Encounter (Signed)
Appt scheduled 01/22/14. Called to pharmacy.

## 2014-01-22 ENCOUNTER — Ambulatory Visit (INDEPENDENT_AMBULATORY_CARE_PROVIDER_SITE_OTHER): Payer: BC Managed Care – PPO | Admitting: Internal Medicine

## 2014-01-22 ENCOUNTER — Encounter: Payer: Self-pay | Admitting: Internal Medicine

## 2014-01-22 ENCOUNTER — Ambulatory Visit (INDEPENDENT_AMBULATORY_CARE_PROVIDER_SITE_OTHER): Payer: BC Managed Care – PPO | Admitting: *Deleted

## 2014-01-22 VITALS — BP 120/70 | HR 59 | Temp 98.5°F | Resp 16 | Ht 73.0 in | Wt 239.5 lb

## 2014-01-22 DIAGNOSIS — Z9989 Dependence on other enabling machines and devices: Principal | ICD-10-CM

## 2014-01-22 DIAGNOSIS — Z0000 Encounter for general adult medical examination without abnormal findings: Secondary | ICD-10-CM

## 2014-01-22 DIAGNOSIS — Z23 Encounter for immunization: Secondary | ICD-10-CM

## 2014-01-22 DIAGNOSIS — I10 Essential (primary) hypertension: Secondary | ICD-10-CM

## 2014-01-22 DIAGNOSIS — E669 Obesity, unspecified: Secondary | ICD-10-CM

## 2014-01-22 DIAGNOSIS — G4733 Obstructive sleep apnea (adult) (pediatric): Secondary | ICD-10-CM

## 2014-01-22 DIAGNOSIS — Z79899 Other long term (current) drug therapy: Secondary | ICD-10-CM

## 2014-01-22 DIAGNOSIS — Z125 Encounter for screening for malignant neoplasm of prostate: Secondary | ICD-10-CM

## 2014-01-22 DIAGNOSIS — Z1211 Encounter for screening for malignant neoplasm of colon: Secondary | ICD-10-CM

## 2014-01-22 LAB — COMPREHENSIVE METABOLIC PANEL
ALBUMIN: 4.3 g/dL (ref 3.5–5.2)
ALT: 33 U/L (ref 0–53)
AST: 25 U/L (ref 0–37)
Alkaline Phosphatase: 67 U/L (ref 39–117)
BUN: 19 mg/dL (ref 6–23)
CO2: 26 mEq/L (ref 19–32)
Calcium: 9.2 mg/dL (ref 8.4–10.5)
Chloride: 105 mEq/L (ref 96–112)
Creatinine, Ser: 1.1 mg/dL (ref 0.4–1.5)
GFR: 71.76 mL/min (ref >60.00)
GLUCOSE: 93 mg/dL (ref 70–99)
POTASSIUM: 4 meq/L (ref 3.5–5.1)
Sodium: 138 mEq/L (ref 135–145)
Total Bilirubin: 0.6 mg/dL (ref 0.2–1.2)
Total Protein: 6.8 g/dL (ref 6.0–8.3)

## 2014-01-22 MED ORDER — ZOLPIDEM TARTRATE 10 MG PO TABS: 10.0000 mg | ORAL_TABLET | Freq: Every evening | ORAL | Status: DC | PRN

## 2014-01-22 MED ORDER — ATORVASTATIN CALCIUM 40 MG PO TABS: 40.0000 mg | ORAL_TABLET | Freq: Every day | ORAL | Status: DC

## 2014-01-22 NOTE — Progress Notes (Signed)
Pre-visit discussion using our clinic review tool. No additional management support is needed unless otherwise documented below in the visit note.  

## 2014-01-22 NOTE — Progress Notes (Signed)
Patient ID: Walter Osborne, male   DOB: 19-Feb-1955, 58 y.o.   MRN: [id number]  The patient is here for his annual male preventive examination and management of other chronic and acute problems.   The risk factors are reflected in the social history.  The roster of all physicians providing medical care to patient - is listed in the Snapshot section of the chart.   Home safety : The patient has smoke detectors in the home. He wears seatbelts.  There are registered  firearms at home. There is no violence in the home.   There is no risks for hepatitis, STDs or HIV. There is no   history of blood transfusion. There is no travel history to infectious disease endemic areas of the world.  The patient has seen their dentist in the last six month and  their eye doctor in the last year.  They do not  have excessive sun exposure. They have seen a dermatoloigist in the last year. Discussed the need for sun protection: hats, long sleeves and use of sunscreen if there is significant sun exposure.   Diet: the importance of a healthy diet is discussed. They do have a healthy diet.  The benefits of regular aerobic exercise were discussed. He exercises a minimum of 30 minutes  5 days per week.  Depression screen: there are no signs or vegative symptoms of depression- irritability, change in appetite, anhedonia, sadness/tearfullness.  The following portions of the patient's history were reviewed and updated as appropriate: allergies, current medications, past family history, past medical history,  past surgical history, past social history  and problem list.  Visual acuity was not assessed per patient preference since he has regular follow up with his ophthalmologist. Hearing and body mass index were assessed and reviewed.   During the course of the visit the patient was educated and counseled about appropriate screening and preventive services including :  nutrition counseling, colorectal cancer screening, and  recommended immunizations.    Objective:  BP 120/70 mmHg  Pulse 59  Temp(Src) 98.5 F (36.9 C) (Oral)  Resp 16  Ht 6\' 1"  (1.854 m)  Wt 239 lb 8 oz (108.636 kg)  BMI 31.60 kg/m2  SpO2 94%  General Appearance:    Alert, cooperative, no distress, appears stated age  Head:    Normocephalic, without obvious abnormality, atraumatic  Eyes:    PERRL, conjunctiva/corneas clear, EOM's intact, fundi    benign, both eyes       Ears:    Normal TM's and external ear canals, both ears  Nose:   Nares normal, septum midline, mucosa normal, no drainage   or sinus tenderness  Throat:   Lips, mucosa, and tongue normal; teeth and gums normal  Neck:   Supple, symmetrical, trachea midline, no adenopathy;       thyroid:  No enlargement/tenderness/nodules; no carotid   bruit or JVD  Back:     Symmetric, no curvature, ROM normal, no CVA tenderness  Lungs:     Clear to auscultation bilaterally, respirations unlabored  Chest wall:    No tenderness or deformity  Heart:    Regular rate and rhythm, S1 and S2 normal, no murmur, rub   or gallop  Abdomen:     Soft, non-tender, bowel sounds active all four quadrants,    no masses, no organomegaly  Genitalia:    Normal male without lesion, discharge or tenderness  Rectal:    Normal tone, normal prostate, no masses or tenderness;   guaiac negative  stool  Extremities:   Extremities normal, atraumatic, no cyanosis or edema  Pulses:   2+ and symmetric all extremities  Skin:   Skin color, texture, turgor normal, no rashes or lesions  Lymph nodes:   Cervical, supraclavicular, and axillary nodes normal  Neurologic:   CNII-XII intact. Normal strength, sensation and reflexes      throughout   Assessment and Plan:  Problem List Items Addressed This Visit      Cardiovascular and Mediastinum   Hypertension    Well controlled on current regimen. Renal function stable, no changes today.  Lab Results  Component Value Date   CREATININE 1.1 01/22/2014   Lab Results   Component Value Date   NA 138 01/22/2014   K 4.0 01/22/2014   CL 105 01/22/2014   CO2 26 01/22/2014       Relevant Medications      atorvastatin (LIPITOR) tablet     Respiratory   OSA on CPAP - Primary    New machine received last month,  Upgraded by [facility],  Uses ambien to fall asleep averaging 6  Hours per night guarded more comfortable.  d      Other   Obesity (BMI 30-39.9)    Body mass index is 31.6 kg/(m^2). I have addressed  BMI and recommended wt loss of 10% of body weigh over the next 6 months using a low glycemic index diet and regular exercise a minimum of 5 days per week.      Routine general medical examination at a health care facility    Annual male comprehensive exam was done including prostate and testicular exam . All screenings and immunizations have been addressed and a printed health maintenance schedule was given to patient.       Screening for prostate cancer    DRE was done and PSA was normal in November .  Lab Results  Component Value Date   PSA 0.63 12/22/2013   PSA 0.54 11/25/2012   PSA 0.64 11/24/2011        Other Visit Diagnoses    Screening for colon cancer        Relevant Orders       Ambulatory referral to General Surgery    Long-term use of high-risk medication        Relevant Orders       Comprehensive metabolic panel (Completed)    Need for prophylactic vaccination with combined diphtheria-tetanus-pertussis (DTP) vaccine        Relevant Orders       Tdap vaccine greater than or equal to 7yo IM (Completed)

## 2014-01-22 NOTE — Patient Instructions (Addendum)
You had your annual  wellness exam today.      Your referral for colonoscopy is under way with Dr Bary Castilla or Dr. Gwendolyn Grant received the TDaP vaccine today.  If you develop a sore arm , use tylenol and ibuprofen for a few days   We will contact you with the bloodwork results  Your last fasting glucose indicates you are at risk for developing diabetes, so I am checking an A1c tody   I want you to lose 25 lbs over the next six months with a low glycemic index diet and regular exercise (30 minutes of cardio 3 days per week is your goal)  This is  my version of a  "Low GI"  Diet:  It will allow you to lose 4 to 8  lbs  per month if you follow it carefully.  Your goal with exercise is a minimum of 30 minutes of aerobic exercise 5 days per week (Walking does not count once it becomes easy!)     All of the foods can be found at grocery stores and in bulk at [employer].  The Atkins protein bars and shakes are available in more varieties at [employer], [employer] and [employer].     7 AM Breakfast:  Choose from the following:  Low carbohydrate Protein  Shakes (I recommend the EAS AdvantEdge "Carb Control" shakes  Or the low carb shakes by Atkins.    2.5 carbs   Arnold's "Sandwhich Thin"toasted  w/ peanut butter (no jelly: about 20 net carbs  "Bagel Thin" with cream cheese and salmon: about 20 carbs   a scrambled egg/bacon/cheese burrito made with Mission's "carb balance" whole wheat tortilla  (about 10 net carbs )  A slice of home made fritatta (egg based dish without a crust:  google it)    Avoid cereal and bananas, oatmeal and cream of wheat and grits. They are loaded with carbohydrates!   10 AM: high protein snack  Protein bar by Atkins (the snack size, under 200 cal, usually < 6 net carbs).    A stick of cheese:  Around 1 carb,  100 cal     Dannon Light n Fit [place] Yogurt  (80 cal, 8 carbs)  Other so called "protein bars" and Greek yogurts tend to be loaded with carbohydrates.  Remember, in  food advertising, the word "energy" is synonymous for " carbohydrate."  Lunch:   A Sandwich using the bread choices listed, Can use any  Eggs,  lunchmeat, grilled meat or canned tuna), avocado, regular mayo/mustard  and cheese.  A Salad using blue cheese, ranch,  Goddess or vinagrette,  No croutons or "confetti" and no "candied nuts" but regular nuts OK.   No pretzels or chips.  Pickles and miniature sweet peppers are a good low carb alternative that provide a "crunch"  The bread is the only source of carbohydrate in a sandwich and  can be decreased by trying some of these alternatives to traditional loaf bread  Joseph's makes a pita bread and a flat bread that are 50 cal and 4 net carbs available at [employer].  This can be toasted to use with hummous as well  Toufayan makes a low carb flatbread that's 100 cal and 9 net carbs available at [employer] and [employer]J's makes 2 sizes of  Low carb whole wheat tortilla  (The large one is 210 cal and 6 net carbs)  Flat Out makes flatbreads that are low carb  as well  Avoid "Low fat dressings, as well as [place] dressings They are loaded with sugar!   3 PM/ Mid day  Snack:  Consider  1 ounce of  almonds, walnuts, pistachios, pecans, peanuts,  Macadamia nuts or a nut medley.  Avoid "granola"; the dried cranberries and raisins are loaded with carbohydrates. Mixed nuts as long as there are no raisins,  cranberries or dried fruit.    Try the prosciutto/mozzarella cheese sticks by Fiorruci  In deli /backery section   High protein   To avoid overindulging in snacks: Try drinking a glass of unsweeted almond/coconut milk  Or a cup of coffee with your Atkins chocolate bar to keep you from having 3!!!   Pork rinds!  Yes Pork Rinds        6 PM  Dinner:     Meat/fowl/fish with a green salad, and either broccoli, cauliflower, green beans, spinach, brussel sprouts or  Lima beans. DO NOT BREAD THE PROTEIN!!      There is a low carb pasta  by Dreamfield's that is acceptable and tastes great: only 5 digestible carbs/serving.( All grocery stores but BJs carry it )  Try Hurley Cisco Angelo's chicken piccata or chicken or eggplant parm over low carb pasta.(Lowes and BJs)   Marjory Lies Sanchez's "Carnitas" (pulled pork, no sauce,  0 carbs) or his beef pot roast to make a dinner burrito (at BJ's)  Pesto over low carb pasta (bj's sells a good quality pesto in the center refrigerated section of the deli   Try satueeing  Cheral Marker with mushroooms  Whole wheat pasta is still full of digestible carbs and  Not as low in glycemic index as Dreamfield's.   Brown rice is still rice,  So skip the rice and noodles if you eat [place] or [place] (or at least limit to 1/2 cup)  9 PM snack :   Breyer's "low carb" fudgsicle or  ice cream bar (Carb Smart line), or  Weight Watcher's ice cream bar , or another "no sugar added" ice cream;  a serving of fresh berries/cherries with whipped cream   Cheese or DANNON'S LlGHT N FIT GREEK YOGURT or the [place] greek yogurt   8 ounces of [place] unsweetened almond/cococunut milk  Cheese and crackers (using WASA crackers,  They are low carb) or peanut butter on low carb crackers or pita bread     Avoid bananas, pineapple, grapes  and watermelon on a regular basis because they are high in sugar.  THINK OF THEM AS DESSERT  Remember that snack Substitutions should be less than 10 NET carbs per serving and meals should be < 25 net carbs. Remember that carbohydrates from fiber do not affect blood sugar, so you can  subtract fiber grams to get the "net carbs " of any particular food item.   Health Maintenance A healthy lifestyle and preventative care can promote health and wellness.  Maintain regular health, dental, and eye exams.  Eat a healthy diet. Foods like vegetables, fruits, whole grains, low-fat dairy products, and lean protein foods contain the nutrients you need and are low in calories. Decrease your intake of foods  high in solid fats, added sugars, and salt. Get information about a proper diet from your health care provider, if necessary.  Regular physical exercise is one of the most important things you can do for your health. Most adults should get at least 150 minutes of moderate-intensity exercise (any activity that increases your heart rate and  causes you to sweat) each week. In addition, most adults need muscle-strengthening exercises on 2 or more days a week.   Maintain a healthy weight. The body mass index (BMI) is a screening tool to identify possible weight problems. It provides an estimate of body fat based on height and weight. Your health care provider can find your BMI and can help you achieve or maintain a healthy weight. For males 20 years and older:  A BMI below 18.5 is considered underweight.  A BMI of 18.5 to 24.9 is normal.  A BMI of 25 to 29.9 is considered overweight.  A BMI of 30 and above is considered obese.  Maintain normal blood lipids and cholesterol by exercising and minimizing your intake of saturated fat. Eat a balanced diet with plenty of fruits and vegetables. Blood tests for lipids and cholesterol should begin at age 59 and be repeated every 5 years. If your lipid or cholesterol levels are high, you are over age 77, or you are at high risk for heart disease, you may need your cholesterol levels checked more frequently.Ongoing high lipid and cholesterol levels should be treated with medicines if diet and exercise are not working.  If you smoke, find out from your health care provider how to quit. If you do not use tobacco, do not start.  Lung cancer screening is recommended for adults aged 48-80 years who are at high risk for developing lung cancer because of a history of smoking. A yearly low-dose CT scan of the lungs is recommended for people who have at least a 30-pack-year history of smoking and are current smokers or have quit within the past 15 years. A pack year of  smoking is smoking an average of 1 pack of cigarettes a day for 1 year (for example, a 30-pack-year history of smoking could mean smoking 1 pack a day for 30 years or 2 packs a day for 15 years). Yearly screening should continue until the smoker has stopped smoking for at least 15 years. Yearly screening should be stopped for people who develop a health problem that would prevent them from having lung cancer treatment.  If you choose to drink alcohol, do not have more than 2 drinks per day. One drink is considered to be 12 oz (360 mL) of beer, 5 oz (150 mL) of wine, or 1.5 oz (45 mL) of liquor.  Avoid the use of street drugs. Do not share needles with anyone. Ask for help if you need support or instructions about stopping the use of drugs.  High blood pressure causes heart disease and increases the risk of stroke. Blood pressure should be checked at least every 1-2 years. Ongoing high blood pressure should be treated with medicines if weight loss and exercise are not effective.  If you are 56-73 years old, ask your health care provider if you should take aspirin to prevent heart disease.  Diabetes screening involves taking a blood sample to check your fasting blood sugar level. This should be done once every 3 years after age 37 if you are at a normal weight and without risk factors for diabetes. Testing should be considered at a younger age or be carried out more frequently if you are overweight and have at least 1 risk factor for diabetes.  Colorectal cancer can be detected and often prevented. Most routine colorectal cancer screening begins at the age of 75 and continues through age 43. However, your health care provider may recommend screening at an earlier age  if you have risk factors for colon cancer. On a yearly basis, your health care provider may provide home test kits to check for hidden blood in the stool. A small camera at the end of a tube may be used to directly examine the colon  (sigmoidoscopy or colonoscopy) to detect the earliest forms of colorectal cancer. Talk to your health care provider about this at age 38 when routine screening begins. A direct exam of the colon should be repeated every 5-10 years through age 90, unless early forms of precancerous polyps or small growths are found.  People who are at an increased risk for hepatitis B should be screened for this virus. You are considered at high risk for hepatitis B if:  You were born in a country where hepatitis B occurs often. Talk with your health care provider about which countries are considered high risk.  Your parents were born in a high-risk country and you have not received a shot to protect against hepatitis B (hepatitis B vaccine).  You have HIV or AIDS.  You use needles to inject street drugs.  You live with, or have sex with, someone who has hepatitis B.  You are a man who has sex with other men (MSM).  You get hemodialysis treatment.  You take certain medicines for conditions like cancer, organ transplantation, and autoimmune conditions.  Hepatitis C blood testing is recommended for all people born from [birth year] through 1964 and any individual with known risk factors for hepatitis C.  Healthy men should no longer receive prostate-specific antigen (PSA) blood tests as part of routine cancer screening. Talk to your health care provider about prostate cancer screening.  Testicular cancer screening is not recommended for adolescents or adult males who have no symptoms. Screening includes self-exam, a health care provider exam, and other screening tests. Consult with your health care provider about any symptoms you have or any concerns you have about testicular cancer.  Practice safe sex. Use condoms and avoid high-risk sexual practices to reduce the spread of sexually transmitted infections (STIs).  You should be screened for STIs, including gonorrhea and chlamydia if:  You are sexually active and  are younger than 24 years.  You are older than 24 years, and your health care provider tells you that you are at risk for this type of infection.  Your sexual activity has changed since you were last screened, and you are at an increased risk for chlamydia or gonorrhea. Ask your health care provider if you are at risk.  If you are at risk of being infected with HIV, it is recommended that you take a prescription medicine daily to prevent HIV infection. This is called pre-exposure prophylaxis (PrEP). You are considered at risk if:  You are a man who has sex with other men (MSM).  You are a heterosexual man who is sexually active with multiple partners.  You take drugs by injection.  You are sexually active with a partner who has HIV.  Talk with your health care provider about whether you are at high risk of being infected with HIV. If you choose to begin PrEP, you should first be tested for HIV. You should then be tested every 3 months for as long as you are taking PrEP.  Use sunscreen. Apply sunscreen liberally and repeatedly throughout the day. You should seek shade when your shadow is shorter than you. Protect yourself by wearing long sleeves, pants, a wide-brimmed hat, and sunglasses year round whenever you are outdoors.  Tell your health care provider of new moles or changes in moles, especially if there is a change in shape or color. Also, tell your health care provider if a mole is larger than the size of a pencil eraser.  A one-time screening for abdominal aortic aneurysm (AAA) and surgical repair of large AAAs by ultrasound is recommended for men aged 26-75 years who are current or former smokers.  Stay current with your vaccines (immunizations). Document Released: 07/29/2007 Document Revised: 02/04/2013 Document Reviewed: 06/27/2010 [facility] Patient Information 2015 [place], [place]. This information is not intended to replace advice given to you by your health care provider. Make  sure you discuss any questions you have with your health care provider.

## 2014-01-22 NOTE — Assessment & Plan Note (Signed)
New machine received last month,  Upgraded by [facility],  Uses ambien to fall asleep averaging 6  Hours per night guarded more comfortable.  d

## 2014-01-24 DIAGNOSIS — Z0000 Encounter for general adult medical examination without abnormal findings: Secondary | ICD-10-CM | POA: Insufficient documentation

## 2014-01-24 DIAGNOSIS — Z125 Encounter for screening for malignant neoplasm of prostate: Secondary | ICD-10-CM | POA: Insufficient documentation

## 2014-01-24 NOTE — Assessment & Plan Note (Addendum)
Annual male comprehensive exam was done including prostate and testicular exam . All screenings and immunizations have been addressed and a printed health maintenance schedule was given to patient.

## 2014-01-24 NOTE — Assessment & Plan Note (Signed)
DRE was done and PSA was normal in November .  Lab Results  Component Value Date   PSA 0.63 12/22/2013   PSA 0.54 11/25/2012   PSA 0.64 11/24/2011

## 2014-01-24 NOTE — Assessment & Plan Note (Signed)
Well controlled on current regimen. Renal function stable, no changes today.  Lab Results  Component Value Date   CREATININE 1.1 01/22/2014   Lab Results  Component Value Date   NA 138 01/22/2014   K 4.0 01/22/2014   CL 105 01/22/2014   CO2 26 01/22/2014

## 2014-01-24 NOTE — Assessment & Plan Note (Signed)
Body mass index is 31.6 kg/(m^2). I have addressed  BMI and recommended wt loss of 10% of body weigh over the next 6 months using a low glycemic index diet and regular exercise a minimum of 5 days per week.

## 2014-01-25 ENCOUNTER — Encounter: Payer: Self-pay | Admitting: Internal Medicine

## 2014-01-26 ENCOUNTER — Encounter: Payer: Self-pay | Admitting: *Deleted

## 2014-01-26 ENCOUNTER — Ambulatory Visit (INDEPENDENT_AMBULATORY_CARE_PROVIDER_SITE_OTHER): Payer: BC Managed Care – PPO | Admitting: General Surgery

## 2014-01-26 ENCOUNTER — Encounter: Payer: Self-pay | Admitting: General Surgery

## 2014-01-26 VITALS — BP 140/72 | HR 76 | Resp 12 | Ht 73.0 in | Wt 238.0 lb

## 2014-01-26 DIAGNOSIS — Z1211 Encounter for screening for malignant neoplasm of colon: Secondary | ICD-10-CM

## 2014-01-26 NOTE — Progress Notes (Unsigned)
Patient ID: Walter Osborne, male   DOB: 1955-05-12, 58 y.o.   MRN: [id number]  Patient will be contacted when February 2016 schedule is available to arrange colonoscopy.   This patient prefers a Wednesday morning for procedure.  Miralax prescription will be sent in once date is arranged.   Please note: [place] 03[place]28[place]2014 through 04-04-2014. Insurance will be the same per patient starting 02-13-2014.

## 2014-01-26 NOTE — Progress Notes (Signed)
Patient ID: Walter Osborne, male   DOB: 01-15-1956, 58 y.o.   MRN: [id number]  Chief Complaint  Patient presents with  . Other    screening colonoscopy    HPI Walter Osborne is a 58 y.o. male here today for a evaluation of a screening colonoscopy. Patient states he has no GI problems at this time. HPI  Past Medical History  Diagnosis Date  . Hyperlipidemia   . Hypertension     History reviewed. No pertinent past surgical history.  Family History  Problem Relation Age of Onset  . Hyperlipidemia Mother   . Diabetes Father   . Cancer Maternal Aunt     Lung CA    Social History History  Substance Use Topics  . Smoking status: Former Smoker    Types: Cigarettes    Quit date: 02/13/1993  . Smokeless tobacco: Never Used  . Alcohol Use: 4.2 oz/week    7 Glasses of wine per week    No Known Allergies  Current Outpatient Prescriptions  Medication Sig Dispense Refill  . atorvastatin (LIPITOR) 40 MG tablet Take 1 tablet (40 mg total) by mouth daily. 90 tablet 1  . benazepril-hydrochlorthiazide (LOTENSIN HCT) 20-12.5 MG per tablet Take 1 tablet by mouth daily. 90 tablet 2  . fluticasone (FLONASE) 50 MCG/ACT nasal spray USE 2 SPRAYS EACH NOSTRIL DAILY 16 g 1  . guaiFENesin-codeine 100-10 MG/5ML syrup Take 5 mLs by mouth 3 (three) times daily as needed. 120 mL 0  . Loratadine (ALAVERT PO) Take by mouth. 1 dissolvable tablet as needed daily for allergies.    Marland Kitchen zolpidem (AMBIEN) 10 MG tablet Take 1 tablet (10 mg total) by mouth at bedtime as needed for sleep. 90 tablet 0   No current facility-administered medications for this visit.    Review of Systems Review of Systems  Constitutional: Negative.   Respiratory: Negative.   Cardiovascular: Negative.     Blood pressure 140/72, pulse 76, resp. rate 12, height 6\' 1"  (1.854 m), weight 238 lb (107.956 kg).  Physical Exam Physical Exam  Constitutional: He is oriented to person, place, and time. He appears well-developed.   Eyes: Conjunctivae are normal. No scleral icterus.  Neck: Neck supple.  Cardiovascular: Normal rate, regular rhythm and normal heart sounds.   Pulmonary/Chest: Effort normal and breath sounds normal.  Abdominal: Soft. Normal appearance and bowel sounds are normal. There is no hepatomegaly. There is no tenderness. No hernia.  Lymphadenopathy:    He has no cervical adenopathy.  Neurological: He is alert and oriented to person, place, and time.  Skin: Skin is warm and dry.    Data Reviewed   Assessment    Pt in need of screening colonoscopy. Reason for the procedure,. Risks and benefits explained to him. He is agreeable.     Plan   Patient to be scheduled for a screening colonoscopy.      Mantaj Chamberlin G 01/26/2014, 4:19 PM

## 2014-01-26 NOTE — Patient Instructions (Signed)
Colonoscopy  A colonoscopy is an exam to look at the entire large intestine (colon). This exam can help find problems such as tumors, polyps, inflammation, and areas of bleeding. The exam takes about 1 hour.   LET YOUR HEALTH CARE PROVIDER KNOW ABOUT:   · Any allergies you have.  · All medicines you are taking, including vitamins, herbs, eye drops, creams, and over-the-counter medicines.  · Previous problems you or members of your family have had with the use of anesthetics.  · Any blood disorders you have.  · Previous surgeries you have had.  · Medical conditions you have.  RISKS AND COMPLICATIONS   Generally, this is a safe procedure. However, as with any procedure, complications can occur. Possible complications include:  · Bleeding.  · Tearing or rupture of the colon wall.  · Reaction to medicines given during the exam.  · Infection (rare).  BEFORE THE PROCEDURE   · Ask your health care provider about changing or stopping your regular medicines.  · You may be prescribed an oral bowel prep. This involves drinking a large amount of medicated liquid, starting the day before your procedure. The liquid will cause you to have multiple loose stools until your stool is almost clear or light green. This cleans out your colon in preparation for the procedure.  · Do not eat or drink anything else once you have started the bowel prep, unless your health care provider tells you it is safe to do so.  · Arrange for someone to drive you home after the procedure.  PROCEDURE   · You will be given medicine to help you relax (sedative).  · You will lie on your side with your knees bent.  · A long, flexible tube with a light and camera on the end (colonoscope) will be inserted through the rectum and into the colon. The camera sends video back to a computer screen as it moves through the colon. The colonoscope also releases carbon dioxide gas to inflate the colon. This helps your health care provider see the area better.  · During  the exam, your health care provider may take a small tissue sample (biopsy) to be examined under a microscope if any abnormalities are found.  · The exam is finished when the entire colon has been viewed.  AFTER THE PROCEDURE   · Do not drive for 24 hours after the exam.  · You may have a small amount of blood in your stool.  · You may pass moderate amounts of gas and have mild abdominal cramping or bloating. This is caused by the gas used to inflate your colon during the exam.  · Ask when your test results will be ready and how you will get your results. Make sure you get your test results.  Document Released: 01/28/2000 Document Revised: 11/20/2012 Document Reviewed: 10/07/2012  [employer]® Patient Information ©2015 [employer]. This information is not intended to replace advice given to you by your health care provider. Make sure you discuss any questions you have with your health care provider.

## 2014-01-29 ENCOUNTER — Telehealth: Payer: Self-pay | Admitting: *Deleted

## 2014-01-29 MED ORDER — POLYETHYLENE GLYCOL 3350 17 GM/SCOOP PO POWD: ORAL | Status: DC

## 2014-01-29 NOTE — Telephone Encounter (Signed)
Patient has been scheduled for a colonoscopy on 03-18-2014 at [facility].  Miralax prescription will be sent to patient's pharmacy today.

## 2014-03-13 ENCOUNTER — Other Ambulatory Visit: Payer: Self-pay | Admitting: General Surgery

## 2014-03-13 DIAGNOSIS — Z1211 Encounter for screening for malignant neoplasm of colon: Secondary | ICD-10-CM

## 2014-03-18 ENCOUNTER — Ambulatory Visit: Payer: Self-pay | Admitting: General Surgery

## 2014-03-18 DIAGNOSIS — Z1211 Encounter for screening for malignant neoplasm of colon: Secondary | ICD-10-CM

## 2014-03-18 DIAGNOSIS — K5730 Diverticulosis of large intestine without perforation or abscess without bleeding: Secondary | ICD-10-CM

## 2014-03-18 DIAGNOSIS — K621 Rectal polyp: Secondary | ICD-10-CM

## 2014-03-18 DIAGNOSIS — D122 Benign neoplasm of ascending colon: Secondary | ICD-10-CM

## 2014-03-19 ENCOUNTER — Encounter: Payer: Self-pay | Admitting: General Surgery

## 2014-03-20 ENCOUNTER — Encounter: Payer: Self-pay | Admitting: General Surgery

## 2014-04-15 ENCOUNTER — Other Ambulatory Visit: Payer: Self-pay | Admitting: Internal Medicine

## 2014-04-16 ENCOUNTER — Other Ambulatory Visit: Payer: Self-pay | Admitting: Internal Medicine

## 2014-04-16 NOTE — Telephone Encounter (Signed)
Called to pharmacy. Pt has send mychart message refill request, notified by mychart on need for appt in June.

## 2014-04-16 NOTE — Telephone Encounter (Signed)
Last OV and refill 12.10.15.  Please advise refill

## 2014-04-16 NOTE — Telephone Encounter (Signed)
90 day supply authorized . Needs 6 month follow up OV prior to next refill

## 2014-06-08 LAB — SURGICAL PATHOLOGY

## 2014-06-26 ENCOUNTER — Other Ambulatory Visit: Payer: Self-pay | Admitting: Internal Medicine

## 2014-06-26 MED ORDER — ZOLPIDEM TARTRATE 10 MG PO TABS: 10.0000 mg | ORAL_TABLET | Freq: Every evening | ORAL | Status: DC | PRN

## 2014-06-26 MED ORDER — ATORVASTATIN CALCIUM 40 MG PO TABS: 40.0000 mg | ORAL_TABLET | Freq: Every day | ORAL | Status: DC

## 2014-06-26 MED ORDER — BENAZEPRIL-HYDROCHLOROTHIAZIDE 20-12.5 MG PO TABS: 1.0000 | ORAL_TABLET | Freq: Every day | ORAL | Status: DC

## 2014-06-26 NOTE — Telephone Encounter (Signed)
Labs scheduled for 07/15/2014 at 9am.  Patient verbalized understanding.

## 2014-06-26 NOTE — Telephone Encounter (Signed)
Faxed to pharmacy

## 2014-06-26 NOTE — Addendum Note (Signed)
Addended by: Heloise Beecham on: 06/26/2014 11:54 AM   Modules accepted: Orders

## 2014-06-26 NOTE — Telephone Encounter (Signed)
Patient had prior okay for refills until now.  He needs an appointment and hasn't scheduled one as of today.  Please advise.  I have denied the refills per the protocol.

## 2014-06-26 NOTE — Telephone Encounter (Signed)
You Can refill for 60 DAYS only, . Needs 6 month follow up In jUNE . Fasting labs prior

## 2014-06-26 NOTE — Telephone Encounter (Signed)
Spoke with patient on the phone, requested to refill his Lotensin/HCTZ also.  I scheduled patient for a follow up appointment on 07/27/2014 at 2pm.  Needs to come get fasting labs prior to visit.

## 2014-07-08 ENCOUNTER — Other Ambulatory Visit: Payer: Self-pay | Admitting: Internal Medicine

## 2014-07-08 NOTE — Telephone Encounter (Deleted)
LAst visit 01/22/14. Has appt and labs scheduled for June. Ok refill?

## 2014-07-15 ENCOUNTER — Other Ambulatory Visit (INDEPENDENT_AMBULATORY_CARE_PROVIDER_SITE_OTHER): Payer: BLUE CROSS/BLUE SHIELD

## 2014-07-15 ENCOUNTER — Telehealth: Payer: Self-pay | Admitting: *Deleted

## 2014-07-15 DIAGNOSIS — Z79899 Other long term (current) drug therapy: Secondary | ICD-10-CM | POA: Diagnosis not present

## 2014-07-15 DIAGNOSIS — E785 Hyperlipidemia, unspecified: Secondary | ICD-10-CM

## 2014-07-15 NOTE — Telephone Encounter (Signed)
Labs and dx?  

## 2014-07-16 LAB — COMPREHENSIVE METABOLIC PANEL
ALT: 34 U/L (ref 0–53)
AST: 22 U/L (ref 0–37)
Albumin: 4.3 g/dL (ref 3.5–5.2)
Alkaline Phosphatase: 84 U/L (ref 39–117)
BUN: 17 mg/dL (ref 6–23)
CALCIUM: 9 mg/dL (ref 8.4–10.5)
CO2: 27 mEq/L (ref 19–32)
CREATININE: 1.07 mg/dL (ref 0.40–1.50)
Chloride: 105 mEq/L (ref 96–112)
GFR: 75.51 mL/min (ref >60.00)
Glucose, Bld: 95 mg/dL (ref 70–99)
Potassium: 4 mEq/L (ref 3.5–5.1)
SODIUM: 138 meq/L (ref 135–145)
TOTAL PROTEIN: 6.7 g/dL (ref 6.0–8.3)
Total Bilirubin: 0.6 mg/dL (ref 0.2–1.2)

## 2014-07-16 LAB — LIPID PANEL
CHOL/HDL RATIO: 3
Cholesterol: 163 mg/dL (ref 0–200)
HDL: 51.3 mg/dL (ref >39.00)
LDL Cholesterol: 81 mg/dL (ref 0–99)
NonHDL: 111.7
TRIGLYCERIDES: 154 mg/dL — AB (ref 0.0–149.0)
VLDL: 30.8 mg/dL (ref 0.0–40.0)

## 2014-07-17 ENCOUNTER — Telehealth: Payer: Self-pay | Admitting: Internal Medicine

## 2014-07-17 ENCOUNTER — Other Ambulatory Visit: Payer: Self-pay | Admitting: Internal Medicine

## 2014-07-17 ENCOUNTER — Encounter: Payer: Self-pay | Admitting: Internal Medicine

## 2014-07-17 NOTE — Telephone Encounter (Signed)
Spoke with the patient, he understood that he needed approval from Dr. Derrel Nip as his appointment isn't until 6/13.

## 2014-07-17 NOTE — Telephone Encounter (Signed)
Pt request refill on Ambien, Lipitor, Lotensin HCT. Please advise pt/msn

## 2014-07-17 NOTE — Telephone Encounter (Signed)
Last OV 12.10.15, next OV 6.13.16. Ok to fill?

## 2014-07-17 NOTE — Telephone Encounter (Signed)
Ok to refill,  printed rx  

## 2014-07-18 ENCOUNTER — Other Ambulatory Visit: Payer: Self-pay | Admitting: Internal Medicine

## 2014-07-27 ENCOUNTER — Encounter: Payer: Self-pay | Admitting: Internal Medicine

## 2014-07-27 ENCOUNTER — Ambulatory Visit (INDEPENDENT_AMBULATORY_CARE_PROVIDER_SITE_OTHER): Payer: BLUE CROSS/BLUE SHIELD | Admitting: Internal Medicine

## 2014-07-27 VITALS — BP 136/74 | HR 68 | Temp 98.2°F | Resp 14 | Ht 73.0 in | Wt 238.5 lb

## 2014-07-27 DIAGNOSIS — E785 Hyperlipidemia, unspecified: Secondary | ICD-10-CM

## 2014-07-27 DIAGNOSIS — I10 Essential (primary) hypertension: Secondary | ICD-10-CM | POA: Diagnosis not present

## 2014-07-27 DIAGNOSIS — L989 Disorder of the skin and subcutaneous tissue, unspecified: Secondary | ICD-10-CM

## 2014-07-27 MED ORDER — FLUTICASONE PROPIONATE 50 MCG/ACT NA SUSP: NASAL | Status: AC

## 2014-07-27 MED ORDER — ATORVASTATIN CALCIUM 40 MG PO TABS: 40.0000 mg | ORAL_TABLET | Freq: Every day | ORAL | Status: DC

## 2014-07-27 MED ORDER — ZOLPIDEM TARTRATE 10 MG PO TABS: 10.0000 mg | ORAL_TABLET | Freq: Every evening | ORAL | Status: DC | PRN

## 2014-07-27 MED ORDER — HYDROCHLOROTHIAZIDE 12.5 MG PO CAPS: 12.5000 mg | ORAL_CAPSULE | Freq: Every day | ORAL | Status: DC

## 2014-07-27 MED ORDER — BENAZEPRIL HCL 20 MG PO TABS: 20.0000 mg | ORAL_TABLET | Freq: Every day | ORAL | Status: DC

## 2014-07-27 NOTE — Progress Notes (Signed)
Subjective:  Patient ID: Walter Osborne, male    DOB: [birth year]  Age: 59 y.o. MRN: [id number]  CC: The primary encounter diagnosis was Skin lesion of cheek. Diagnoses of Essential hypertension and Hyperlipidemia were also pertinent to this visit.  HPI Walter Osborne presents for follow up o n hyperlipidemia and hypertension.  He was last seen in December,. He is tolerating his medications but is requesting a medication change in future refills as the price of lotension hct has increased to $119/month  Has been working out with a trainer 2 days .week, golds and tennis and treadmill the other days.   Has lost 7 lbs since his last visit .     [name]  Medication Sig Dispense Refill  . benazepril-hydrochlorthiazide (LOTENSIN HCT) 20-12.5 MG per tablet Take 1 tablet by mouth daily. 90 tablet 0  . Loratadine (ALAVERT PO) Take by mouth. 1 dissolvable tablet as needed daily for allergies.    . polyethylene glycol powder (GLYCOLAX/MIRALAX) powder 255 grams one bottle for colonoscopy prep 255 g 0  . atorvastatin (LIPITOR) 40 MG tablet Take 1 tablet (40 mg total) by mouth daily. 90 tablet 0  . atorvastatin (LIPITOR) 40 MG tablet Take 1 tablet (40 mg total) by mouth daily. 90 tablet 0  . fluticasone (FLONASE) 50 MCG/ACT nasal spray USE 2 SPRAYS EACH NOSTRIL DAILY 16 g 1  . zolpidem (AMBIEN) 10 MG tablet TAKE ONE TABLET BY MOUTH AT BEDTIME AS NEEDED FOR SLEEP 90 tablet 0  . guaiFENesin-codeine 100-10 MG/5ML syrup Take 5 mLs by mouth 3 (three) times daily as needed. (Patient not taking: Reported on 07/27/2014) 120 mL 0   No facility-administered medications prior to visit.    Review of Systems;  Patient denies headache, fevers, malaise, unintentional weight loss, skin rash, eye pain, sinus congestion and sinus pain, sore throat, dysphagia,  hemoptysis , cough, dyspnea, wheezing, chest pain, palpitations, orthopnea, edema, abdominal pain, nausea, melena,  diarrhea, constipation, flank pain, dysuria, hematuria, urinary  Frequency, nocturia, numbness, tingling, seizures,  Focal weakness, Loss of consciousness,  Tremor, insomnia, depression, anxiety, and suicidal ideation.      Objective:  BP 136/74 mmHg  Pulse 68  Temp(Src) 98.2 F (36.8 C) (Oral)  Resp 14  Ht 6\' 1"  (1.854 m)  Wt 238 lb 8 oz (108.183 kg)  BMI 31.47 kg/m2  SpO2 93%  BP Readings from Last 3 Encounters:  07/27/14 136/74  01/26/14 140/72  01/22/14 120/70    Wt Readings from Last 3 Encounters:  07/27/14 238 lb 8 oz (108.183 kg)  01/26/14 238 lb (107.956 kg)  01/22/14 239 lb 8 oz (108.636 kg)    General appearance: alert, cooperative and appears stated age Ears: normal TM's and external ear canals both ears Throat: lips, mucosa, and tongue normal; teeth and gums normal Neck: no adenopathy, no carotid bruit, supple, symmetrical, trachea midline and thyroid not enlarged, symmetric, no tenderness/mass/nodules Back: symmetric, no curvature. ROM normal. No CVA tenderness. Lungs: clear to auscultation bilaterally Heart: regular rate and rhythm, S1, S2 normal, no murmur, click, rub or gallop Abdomen: soft, non-tender; bowel sounds normal; no masses,  no organomegaly Pulses: 2+ and symmetric Skin: Skin color, texture, turgor normal. No rashes or lesions Lymph nodes: Cervical, supraclavicular, and axillary nodes normal.  No results found for: HGBA1C  Lab Results  Component Value Date   CREATININE 1.07 07/15/2014   CREATININE 1.1 01/22/2014   CREATININE 1.1 07/21/2013    Lab Results  Component Value  Date   WBC 6.1 06/30/2011   HGB 14.4 06/30/2011   HCT 42.0 06/30/2011   PLT 190.0 06/30/2011   GLUCOSE 95 07/15/2014   CHOL 163 07/15/2014   TRIG 154.0* 07/15/2014   HDL 51.30 07/15/2014   LDLDIRECT 152.3 12/09/2012   LDLCALC 81 07/15/2014   ALT 34 07/15/2014   AST 22 07/15/2014   NA 138 07/15/2014   K 4.0 07/15/2014   CL 105 07/15/2014   CREATININE 1.07  07/15/2014   BUN 17 07/15/2014   CO2 27 07/15/2014   TSH 1.06 04/22/2012   PSA 1.99 09/03/2013   MICROALBUR 0.1 07/21/2013    No results found.  Assessment & Plan:   Problem List Items Addressed This Visit    Hypertension    Well controlled on current regimen. Renal function stable, no changes today except to fill the benazepril and hct separately.  Lab Results  Component Value Date   CREATININE 1.07 07/15/2014   Lab Results  Component Value Date   NA 138 07/15/2014   K 4.0 07/15/2014   CL 105 07/15/2014   CO2 27 07/15/2014         Relevant Medications   atorvastatin (LIPITOR) 40 MG tablet   benazepril (LOTENSIN) 20 MG tablet   hydrochlorothiazide (MICROZIDE) 12.5 MG capsule   Hyperlipidemia    Managed with  statin therapy.   He has raised his HDL considerably with diet and exercise.  Liver enzymes are normal , no changes today.  Lab Results  Component Value Date   CHOL 163 07/15/2014   HDL 51.30 07/15/2014   LDLCALC 81 07/15/2014   LDLDIRECT 152.3 12/09/2012   TRIG 154.0* 07/15/2014   CHOLHDL 3 07/15/2014   Lab Results  Component Value Date   ALT 34 07/15/2014   AST 22 07/15/2014   ALKPHOS 84 07/15/2014   BILITOT 0.6 07/15/2014           Relevant Medications   atorvastatin (LIPITOR) 40 MG tablet   benazepril (LOTENSIN) 20 MG tablet   hydrochlorothiazide (MICROZIDE) 12.5 MG capsule    Other Visit Diagnoses    Skin lesion of cheek    -  Primary    Relevant Orders    Ambulatory referral to Dermatology       I have changed Mr. Harnack's zolpidem. I am also having him start on benazepril and hydrochlorothiazide. Additionally, I am having him maintain his Loratadine (ALAVERT PO), guaiFENesin-codeine, polyethylene glycol powder, benazepril-hydrochlorthiazide, atorvastatin, and fluticasone.  Meds ordered this encounter  Medications  . atorvastatin (LIPITOR) 40 MG tablet    Sig: Take 1 tablet (40 mg total) by mouth daily.    Dispense:  90 tablet     Refill:  1  . zolpidem (AMBIEN) 10 MG tablet    Sig: Take 1 tablet (10 mg total) by mouth at bedtime as needed. for sleep    Dispense:  90 tablet    Refill:  1  . fluticasone (FLONASE) 50 MCG/ACT nasal spray    Sig: USE 2 SPRAYS EACH NOSTRIL DAILY    Dispense:  16 g    Refill:  1  . benazepril (LOTENSIN) 20 MG tablet    Sig: Take 1 tablet (20 mg total) by mouth daily.    Dispense:  90 tablet    Refill:  3  . hydrochlorothiazide (MICROZIDE) 12.5 MG capsule    Sig: Take 1 capsule (12.5 mg total) by mouth daily.    Dispense:  90 capsule    Refill:  1  Medications Discontinued During This Encounter  Medication Reason  . atorvastatin (LIPITOR) 40 MG tablet   . atorvastatin (LIPITOR) 40 MG tablet Reorder  . zolpidem (AMBIEN) 10 MG tablet Reorder  . fluticasone (FLONASE) 50 MCG/ACT nasal spray Reorder    Follow-up: No Follow-up on file.   Crecencio Mc, MD

## 2014-07-28 NOTE — Assessment & Plan Note (Addendum)
Well controlled on current regimen. Renal function stable, no changes today except to fill the benazepril and hct separately.  Lab Results  Component Value Date   CREATININE 1.07 07/15/2014   Lab Results  Component Value Date   NA 138 07/15/2014   K 4.0 07/15/2014   CL 105 07/15/2014   CO2 27 07/15/2014

## 2014-07-28 NOTE — Assessment & Plan Note (Signed)
Managed with  statin therapy.   He has raised his HDL considerably with diet and exercise.  Liver enzymes are normal , no changes today.  Lab Results  Component Value Date   CHOL 163 07/15/2014   HDL 51.30 07/15/2014   LDLCALC 81 07/15/2014   LDLDIRECT 152.3 12/09/2012   TRIG 154.0* 07/15/2014   CHOLHDL 3 07/15/2014   Lab Results  Component Value Date   ALT 34 07/15/2014   AST 22 07/15/2014   ALKPHOS 84 07/15/2014   BILITOT 0.6 07/15/2014

## 2015-04-17 ENCOUNTER — Other Ambulatory Visit: Payer: Self-pay | Admitting: Internal Medicine

## 2015-04-19 ENCOUNTER — Other Ambulatory Visit: Payer: Self-pay | Admitting: Internal Medicine

## 2015-04-19 NOTE — Telephone Encounter (Signed)
Ok to refill,  Authorized in epic 

## 2015-04-19 NOTE — Telephone Encounter (Signed)
Refilled on 07/2014. please advise?

## 2015-04-20 NOTE — Telephone Encounter (Signed)
Last OV 6/16 ok to refill Ambien?

## 2015-04-21 ENCOUNTER — Other Ambulatory Visit: Payer: Self-pay | Admitting: Internal Medicine

## 2015-04-21 NOTE — Telephone Encounter (Signed)
error 

## 2015-04-22 ENCOUNTER — Other Ambulatory Visit: Payer: Self-pay | Admitting: Internal Medicine

## 2015-06-23 DIAGNOSIS — J309 Allergic rhinitis, unspecified: Secondary | ICD-10-CM | POA: Diagnosis not present

## 2015-06-23 DIAGNOSIS — J019 Acute sinusitis, unspecified: Secondary | ICD-10-CM | POA: Diagnosis not present

## 2015-07-13 ENCOUNTER — Other Ambulatory Visit: Payer: Self-pay | Admitting: Internal Medicine

## 2015-07-14 NOTE — Telephone Encounter (Signed)
NO OV since 6/16 please advise to refill Ambien ?

## 2015-07-14 NOTE — Telephone Encounter (Signed)
Refill for 30 days only.  OFFICE VISIT NEEDED prior to any more refills 

## 2015-07-15 NOTE — Telephone Encounter (Signed)
Refill faxed

## 2015-07-28 ENCOUNTER — Other Ambulatory Visit: Payer: Self-pay | Admitting: Internal Medicine

## 2015-07-29 ENCOUNTER — Other Ambulatory Visit: Payer: Self-pay | Admitting: Internal Medicine

## 2015-07-29 MED ORDER — ZOLPIDEM TARTRATE 10 MG PO TABS: 10.0000 mg | ORAL_TABLET | Freq: Every evening | ORAL | Status: DC | PRN

## 2015-07-29 NOTE — Telephone Encounter (Signed)
Pt sent a Mychart to set up a appt to come in. Pt is scheduled for 07/18 he does need a refill until the appt for zolpidem (AMBIEN) 10 MG tablet.   Pharmacy is [facility] [place], [place].   Call pt @ [phone]. Thank you!

## 2015-07-29 NOTE — Telephone Encounter (Signed)
Patient scheduled OV for 7/18, please advise refill till then.  Per Mychart you agreed to send a 30 if her scheduled an appt. thanks

## 2015-07-29 NOTE — Telephone Encounter (Signed)
Refilled for 45 days

## 2015-07-29 NOTE — Telephone Encounter (Signed)
Faxed

## 2015-08-23 ENCOUNTER — Other Ambulatory Visit: Payer: Self-pay | Admitting: Internal Medicine

## 2015-08-23 NOTE — Telephone Encounter (Signed)
30 DAYS

## 2015-08-23 NOTE — Telephone Encounter (Signed)
Patient scheduled and appointment for 08/31/15 can we fill til appointment?

## 2015-08-24 NOTE — Telephone Encounter (Signed)
faxed

## 2015-08-31 ENCOUNTER — Ambulatory Visit (INDEPENDENT_AMBULATORY_CARE_PROVIDER_SITE_OTHER): Payer: BLUE CROSS/BLUE SHIELD | Admitting: Internal Medicine

## 2015-08-31 ENCOUNTER — Encounter: Payer: Self-pay | Admitting: Internal Medicine

## 2015-08-31 VITALS — BP 138/84 | HR 60 | Temp 98.4°F | Resp 12 | Ht 73.0 in | Wt 239.5 lb

## 2015-08-31 DIAGNOSIS — Z125 Encounter for screening for malignant neoplasm of prostate: Secondary | ICD-10-CM

## 2015-08-31 DIAGNOSIS — R5383 Other fatigue: Secondary | ICD-10-CM | POA: Diagnosis not present

## 2015-08-31 DIAGNOSIS — E559 Vitamin D deficiency, unspecified: Secondary | ICD-10-CM | POA: Diagnosis not present

## 2015-08-31 DIAGNOSIS — G4730 Sleep apnea, unspecified: Secondary | ICD-10-CM

## 2015-08-31 DIAGNOSIS — E785 Hyperlipidemia, unspecified: Secondary | ICD-10-CM | POA: Diagnosis not present

## 2015-08-31 DIAGNOSIS — Z0000 Encounter for general adult medical examination without abnormal findings: Secondary | ICD-10-CM

## 2015-08-31 DIAGNOSIS — I10 Essential (primary) hypertension: Secondary | ICD-10-CM

## 2015-08-31 DIAGNOSIS — G4700 Insomnia, unspecified: Secondary | ICD-10-CM

## 2015-08-31 MED ORDER — HYDROCHLOROTHIAZIDE 12.5 MG PO CAPS: 12.5000 mg | ORAL_CAPSULE | Freq: Every day | ORAL | Status: DC

## 2015-08-31 MED ORDER — ATORVASTATIN CALCIUM 40 MG PO TABS: 40.0000 mg | ORAL_TABLET | Freq: Every day | ORAL | Status: DC

## 2015-08-31 MED ORDER — BENAZEPRIL HCL 20 MG PO TABS: 20.0000 mg | ORAL_TABLET | Freq: Every day | ORAL | Status: DC

## 2015-08-31 MED ORDER — ZOLPIDEM TARTRATE 10 MG PO TABS: ORAL_TABLET | ORAL | Status: DC

## 2015-08-31 NOTE — Progress Notes (Signed)
Pre-visit discussion using our clinic review tool. No additional management support is needed unless otherwise documented below in the visit note.  

## 2015-08-31 NOTE — Progress Notes (Signed)
Patient ID: Walter Osborne, male    DOB: [birth year]  Age: 60 y.o. MRN: [id number]  The patient is here for annual wellness examination and management of other chronic and acute problems.  The risk factors are reflected in the social history.  The roster of all physicians providing medical care to patient - is listed in the Snapshot section of the chart.   Home safety : The patient has smoke detectors in the home. They wear seatbelts.  There are no firearms at home. There is no violence in the home.   There is no risks for hepatitis, STDs or HIV. There is no   history of blood transfusion. They have no travel history to infectious disease endemic areas of the world.  The patient has seen their dentist in the last six month. They have seen their eye doctor in the last year. T  They do not  have excessive sun exposure. Discussed the need for sun protection: hats, long sleeves and use of sunscreen if there is significant sun exposure.   Diet: the importance of a healthy diet is discussed. They do have a healthy diet.  The benefits of regular aerobic exercise were discussed.   Depression screen: there are no signs or vegative symptoms of depression- irritability, change in appetite, anhedonia, sadness/tearfullness.  The following portions of the patient's history were reviewed and updated as appropriate: allergies, current medications, past family history, past medical history,  past surgical history, past social history  and problem list.  Visual acuity was not assessed per patient preference since she has regular follow up with her ophthalmologist. Hearing and body mass index were assessed and reviewed.   During the course of the visit the patient was educated and counseled about appropriate screening and preventive services including : fall prevention , diabetes screening, nutrition counseling, colorectal cancer screening, and recommended immunizations.    CC: The primary encounter  diagnosis was Essential hypertension. Diagnoses of Hyperlipidemia, Vitamin D deficiency, Prostate cancer screening, Other fatigue, Insomnia w/ sleep apnea, and Routine general medical examination at a health care facility were also pertinent to this visit. Last seen June 2016.  Patient is taking his medications as prescribed and notes no adverse effects.  Home BP readings have been done about once per week and are  generally < 130/80 .  he is avoiding added salt in her diet and walking regularly about 3 times per week for exercise  .  Obesity:  Working out,  Having occasional right knee pain.  Has had Ortho eval with Ed Hines in the past  For aspiration of effusion.using occasional NSAID .  OSA: managed by Margaretha Sheffield on CPAP . Cleaning it well.   Insomnia:  Sleeping well with regular nightly use of ambien      [name]   He has no past surgical history on file.   [name]; [name]; [name].He reports that he quit smoking about 22 years ago. His smoking use included Cigarettes. He has never used smokeless tobacco. He reports that he drinks about 4.2 oz of alcohol per week. He reports that he does not use illicit drugs.  [name]  Medication Sig Dispense Refill  . fluticasone (FLONASE) 50 MCG/ACT nasal spray USE 2 SPRAYS EACH NOSTRIL DAILY 16 g 1  . Loratadine (ALAVERT PO) Take by mouth. 1 dissolvable tablet as needed  daily for allergies.    Marland Kitchen atorvastatin (LIPITOR) 40 MG tablet Take 1 tablet (40 mg total) by mouth daily. 90 tablet 1  . benazepril-hydrochlorthiazide (LOTENSIN HCT) 20-12.5 MG per tablet Take 1 tablet by mouth daily. 90 tablet 0  . hydrochlorothiazide (MICROZIDE) 12.5 MG capsule TAKE ONE CAPSULE BY MOUTH ONCE DAILY 90 capsule 0  . zolpidem (AMBIEN) 10 MG tablet TAKE ONE TABLET BY MOUTH AT  BEDTIME AS NEEDED FOR SLEEP *OFFICE  VISIT  NEEDED  BEFORE  ANYMORE  REFILLS* 30 tablet 0  . guaiFENesin-codeine 100-10 MG/5ML syrup Take 5 mLs by mouth 3 (three) times daily as needed. (Patient not taking: Reported on 07/27/2014) 120 mL 0  . polyethylene glycol powder (GLYCOLAX/MIRALAX) powder 255 grams one bottle for colonoscopy prep (Patient not taking: Reported on 08/31/2015) 255 g 0  . benazepril (LOTENSIN) 20 MG tablet Take 1 tablet (20 mg total) by mouth daily. (Patient not taking: Reported on 08/31/2015) 90 tablet 3  . hydrochlorothiazide (MICROZIDE) 12.5 MG capsule TAKE ONE CAPSULE BY MOUTH ONCE DAILY 90 capsule 0   No facility-administered medications prior to visit.    Review of Systems  Patient denies headache, fevers, malaise, unintentional weight loss, skin rash, eye pain, sinus congestion and sinus pain, sore throat, dysphagia,  hemoptysis , cough, dyspnea, wheezing, chest pain, palpitations, orthopnea, edema, abdominal pain, nausea, melena, diarrhea, constipation, flank pain, dysuria, hematuria, urinary  Frequency, nocturia, numbness, tingling, seizures,  Focal weakness, Loss of consciousness,  Tremor, insomnia, depression, anxiety, and suicidal ideation.     Objective:  BP 138/84 mmHg  Pulse 60  Temp(Src) 98.4 F (36.9 C) (Oral)  Resp 12  Ht 6\' 1"  (1.854 m)  Wt 239 lb 8 oz (108.636 kg)  BMI 31.60 kg/m2  SpO2 96%  Physical Exam   General appearance: alert, cooperative and appears stated age Ears: normal TM's and external ear canals both ears Throat: lips, mucosa, and tongue normal; teeth and gums normal Neck: no adenopathy, no carotid bruit, supple, symmetrical, trachea midline and thyroid not enlarged, symmetric, no tenderness/mass/nodules Back: symmetric, no curvature. ROM normal. No CVA tenderness. Lungs: clear to auscultation bilaterally Heart: regular rate and rhythm, S1, S2 normal, no murmur, click, rub or gallop Abdomen: soft, non-tender; bowel sounds normal; no  masses,  no organomegaly Pulses: 2+ and symmetric Skin: Skin color, texture, turgor normal. No rashes or lesions Lymph nodes: Cervical, supraclavicular, and axillary nodes normal.    Assessment & Plan:   Problem List Items Addressed This Visit    Hypertension - Primary    Mild elevation current regimen. Renal function is overdue no changes today except to fill the benazepril and hct separately.  Patient has been asked to check BP at home   Lab Results  Component Value Date   CREATININE 1.07 07/15/2014   Lab Results  Component Value Date   NA 138 07/15/2014   K 4.0 07/15/2014   CL 105 07/15/2014   CO2 27 07/15/2014           Relevant Medications   atorvastatin (LIPITOR) 40 MG tablet   hydrochlorothiazide (MICROZIDE) 12.5 MG capsule   benazepril (LOTENSIN) 20 MG tablet   Other Relevant Orders   Comprehensive metabolic panel   Hyperlipidemia    Managed with  statin therapy.   He has raised his HDL considerably with diet and exercise.  Liver enzymes are overdue and he will return in a week to have fating labs repeated , no changes today.  Lab Results  Component Value  Date   CHOL 163 07/15/2014   HDL 51.30 07/15/2014   LDLCALC 81 07/15/2014   LDLDIRECT 152.3 12/09/2012   TRIG 154.0* 07/15/2014   CHOLHDL 3 07/15/2014   Lab Results  Component Value Date   ALT 34 07/15/2014   AST 22 07/15/2014   ALKPHOS 84 07/15/2014   BILITOT 0.6 07/15/2014             Relevant Medications   atorvastatin (LIPITOR) 40 MG tablet   hydrochlorothiazide (MICROZIDE) 12.5 MG capsule   benazepril (LOTENSIN) 20 MG tablet   Other Relevant Orders   Lipid panel   LDL cholesterol, direct   Routine general medical examination at a health care facility    Annual comprehensive preventive exam was done as well as an evaluation and management of acute and chronic conditions .  During the course of the visit the patient was educated and counseled about appropriate screening and preventive  services including :  diabetes screening, lipid analysis with projected  10 year  risk for CAD , nutrition counseling, prostate and colorectal cancer screening, and recommended immunizations.  Printed recommendations for health maintenance screenings was given.       Insomnia w/ sleep apnea    Managed with ambien to fall asleep averaging 6  Hours per night on CPAP         Other Visit Diagnoses    Vitamin D deficiency        Prostate cancer screening        Other fatigue        Relevant Orders    TSH       I have discontinued Mr. Walter Osborne's benazepril-hydrochlorthiazide and hydrochlorothiazide. I have also changed his zolpidem and hydrochlorothiazide. Additionally, I am having him maintain his Loratadine (ALAVERT PO), guaiFENesin-codeine, polyethylene glycol powder, fluticasone, atorvastatin, and benazepril.  Meds ordered this encounter  Medications  . zolpidem (AMBIEN) 10 MG tablet    Sig: TAKE ONE TABLET BY MOUTH AT BEDTIME AS NEEDED FOR SLEEP    Dispense:  90 tablet    Refill:  1    KEEP ON FILE FOR FUTURE REFILLS  . atorvastatin (LIPITOR) 40 MG tablet    Sig: Take 1 tablet (40 mg total) by mouth daily.    Dispense:  90 tablet    Refill:  1  . hydrochlorothiazide (MICROZIDE) 12.5 MG capsule    Sig: Take 1 capsule (12.5 mg total) by mouth daily.    Dispense:  90 capsule    Refill:  1    Needs Office visit for further refills  . benazepril (LOTENSIN) 20 MG tablet    Sig: Take 1 tablet (20 mg total) by mouth daily.    Dispense:  90 tablet    Refill:  1    Medications Discontinued During This Encounter  Medication Reason  . hydrochlorothiazide (MICROZIDE) 12.5 MG capsule Error  . hydrochlorothiazide (MICROZIDE) 12.5 MG capsule Reorder  . benazepril (LOTENSIN) 20 MG tablet Reorder  . zolpidem (AMBIEN) 10 MG tablet Reorder  . atorvastatin (LIPITOR) 40 MG tablet Reorder  . benazepril-hydrochlorthiazide (LOTENSIN HCT) 20-12.5 MG per tablet   . hydrochlorothiazide  (MICROZIDE) 12.5 MG capsule Reorder  . benazepril (LOTENSIN) 20 MG tablet Reorder    Follow-up: Return in about 6 days (around 09/06/2015), or fasting labs ,  6 month follow up .   Crecencio Mc, MD

## 2015-08-31 NOTE — Patient Instructions (Addendum)
Your BP regimen  is 2 pills:  Benazepril  and hctz  Please check your  at home after a week  Of taking both meds   Goal is 130/80 or less.  If not below that,  Call for medication adjustment   Return after July  22 for fasting labs  I'll see you in 6 months   Health Maintenance, Male A healthy lifestyle and preventative care can promote health and wellness.  Maintain regular health, dental, and eye exams.  Eat a healthy diet. Foods like vegetables, fruits, whole grains, low-fat dairy products, and lean protein foods contain the nutrients you need and are low in calories. Decrease your intake of foods high in solid fats, added sugars, and salt. Get information about a proper diet from your health care provider, if necessary.  Regular physical exercise is one of the most important things you can do for your health. Most adults should get at least 150 minutes of moderate-intensity exercise (any activity that increases your heart rate and causes you to sweat) each week. In addition, most adults need muscle-strengthening exercises on 2 or more days a week.   Maintain a healthy weight. The body mass index (BMI) is a screening tool to identify possible weight problems. It provides an estimate of body fat based on height and weight. Your health care provider can find your BMI and can help you achieve or maintain a healthy weight. For males 20 years and older:  A BMI below 18.5 is considered underweight.  A BMI of 18.5 to 24.9 is normal.  A BMI of 25 to 29.9 is considered overweight.  A BMI of 30 and above is considered obese.  Maintain normal blood lipids and cholesterol by exercising and minimizing your intake of saturated fat. Eat a balanced diet with plenty of fruits and vegetables. Blood tests for lipids and cholesterol should begin at age 10 and be repeated every 5 years. If your lipid or cholesterol levels are high, you are over age 38, or you are at high risk for heart disease, you may  need your cholesterol levels checked more frequently.Ongoing high lipid and cholesterol levels should be treated with medicines if diet and exercise are not working.  If you smoke, find out from your health care provider how to quit. If you do not use tobacco, do not start.  Lung cancer screening is recommended for adults aged 69-80 years who are at high risk for developing lung cancer because of a history of smoking. A yearly low-dose CT scan of the lungs is recommended for people who have at least a 30-pack-year history of smoking and are current smokers or have quit within the past 15 years. A pack year of smoking is smoking an average of 1 pack of cigarettes a day for 1 year (for example, a 30-pack-year history of smoking could mean smoking 1 pack a day for 30 years or 2 packs a day for 15 years). Yearly screening should continue until the smoker has stopped smoking for at least 15 years. Yearly screening should be stopped for people who develop a health problem that would prevent them from having lung cancer treatment.  If you choose to drink alcohol, do not have more than 2 drinks per day. One drink is considered to be 12 oz (360 mL) of beer, 5 oz (150 mL) of wine, or 1.5 oz (45 mL) of liquor.  Avoid the use of street drugs. Do not share needles with anyone. Ask for help if  you need support or instructions about stopping the use of drugs.  High blood pressure causes heart disease and increases the risk of stroke. High blood pressure is more likely to develop in:  People who have blood pressure in the end of the normal range (100-139/85-89 mm Hg).  People who are overweight or obese.  People who are African American.  If you are 7-93 years of age, have your blood pressure checked every 3-5 years. If you are 22 years of age or older, have your blood pressure checked every year. You should have your blood pressure measured twice--once when you are at a hospital or clinic, and once when you are  not at a hospital or clinic. Record the average of the two measurements. To check your blood pressure when you are not at a hospital or clinic, you can use:  An automated blood pressure machine at a pharmacy.  A home blood pressure monitor.  If you are 69-30 years old, ask your health care provider if you should take aspirin to prevent heart disease.  Diabetes screening involves taking a blood sample to check your fasting blood sugar level. This should be done once every 3 years after age 27 if you are at a normal weight and without risk factors for diabetes. Testing should be considered at a younger age or be carried out more frequently if you are overweight and have at least 1 risk factor for diabetes.  Colorectal cancer can be detected and often prevented. Most routine colorectal cancer screening begins at the age of 23 and continues through age 62. However, your health care provider may recommend screening at an earlier age if you have risk factors for colon cancer. On a yearly basis, your health care provider may provide home test kits to check for hidden blood in the stool. A small camera at the end of a tube may be used to directly examine the colon (sigmoidoscopy or colonoscopy) to detect the earliest forms of colorectal cancer. Talk to your health care provider about this at age 36 when routine screening begins. A direct exam of the colon should be repeated every 5-10 years through age 65, unless early forms of precancerous polyps or small growths are found.  People who are at an increased risk for hepatitis B should be screened for this virus. You are considered at high risk for hepatitis B if:  You were born in a country where hepatitis B occurs often. Talk with your health care provider about which countries are considered high risk.  Your parents were born in a high-risk country and you have not received a shot to protect against hepatitis B (hepatitis B vaccine).  You have HIV or  AIDS.  You use needles to inject street drugs.  You live with, or have sex with, someone who has hepatitis B.  You are a man who has sex with other men (MSM).  You get hemodialysis treatment.  You take certain medicines for conditions like cancer, organ transplantation, and autoimmune conditions.  Hepatitis C blood testing is recommended for all people born from [birth year] through 1964 and any individual with known risk factors for hepatitis C.  Healthy men should no longer receive prostate-specific antigen (PSA) blood tests as part of routine cancer screening. Talk to your health care provider about prostate cancer screening.  Testicular cancer screening is not recommended for adolescents or adult males who have no symptoms. Screening includes self-exam, a health care provider exam, and other screening tests. Consult  with your health care provider about any symptoms you have or any concerns you have about testicular cancer.  Practice safe sex. Use condoms and avoid high-risk sexual practices to reduce the spread of sexually transmitted infections (STIs).  You should be screened for STIs, including gonorrhea and chlamydia if:  You are sexually active and are younger than 24 years.  You are older than 24 years, and your health care provider tells you that you are at risk for this type of infection.  Your sexual activity has changed since you were last screened, and you are at an increased risk for chlamydia or gonorrhea. Ask your health care provider if you are at risk.  If you are at risk of being infected with HIV, it is recommended that you take a prescription medicine daily to prevent HIV infection. This is called pre-exposure prophylaxis (PrEP). You are considered at risk if:  You are a man who has sex with other men (MSM).  You are a heterosexual man who is sexually active with multiple partners.  You take drugs by injection.  You are sexually active with a partner who has  HIV.  Talk with your health care provider about whether you are at high risk of being infected with HIV. If you choose to begin PrEP, you should first be tested for HIV. You should then be tested every 3 months for as long as you are taking PrEP.  Use sunscreen. Apply sunscreen liberally and repeatedly throughout the day. You should seek shade when your shadow is shorter than you. Protect yourself by wearing long sleeves, pants, a wide-brimmed hat, and sunglasses year round whenever you are outdoors.  Tell your health care provider of new moles or changes in moles, especially if there is a change in shape or color. Also, tell your health care provider if a mole is larger than the size of a pencil eraser.  A one-time screening for abdominal aortic aneurysm (AAA) and surgical repair of large AAAs by ultrasound is recommended for men aged 21-75 years who are current or former smokers.  Stay current with your vaccines (immunizations).   This information is not intended to replace advice given to you by your health care provider. Make sure you discuss any questions you have with your health care provider.   Document Released: 07/29/2007 Document Revised: 02/20/2014 Document Reviewed: 06/27/2010 Elsevier Interactive Patient Education [employer].

## 2015-09-02 NOTE — Assessment & Plan Note (Addendum)
Mild elevation current regimen. Renal function is overdue no changes today except to fill the benazepril and hct separately.  Patient has been asked to check BP at home   Lab Results  Component Value Date   CREATININE 1.07 07/15/2014   Lab Results  Component Value Date   NA 138 07/15/2014   K 4.0 07/15/2014   CL 105 07/15/2014   CO2 27 07/15/2014

## 2015-09-02 NOTE — Assessment & Plan Note (Addendum)
Managed with  statin therapy.   He has raised his HDL considerably with diet and exercise.  Liver enzymes are overdue and he will return in a week to have fating labs repeated , no changes today.  Lab Results  Component Value Date   CHOL 163 07/15/2014   HDL 51.30 07/15/2014   LDLCALC 81 07/15/2014   LDLDIRECT 152.3 12/09/2012   TRIG 154.0* 07/15/2014   CHOLHDL 3 07/15/2014   Lab Results  Component Value Date   ALT 34 07/15/2014   AST 22 07/15/2014   ALKPHOS 84 07/15/2014   BILITOT 0.6 07/15/2014

## 2015-09-02 NOTE — Assessment & Plan Note (Signed)
Managed with ambien to fall asleep averaging 6  Hours per night on CPAP

## 2015-09-02 NOTE — Assessment & Plan Note (Signed)

## 2015-09-06 ENCOUNTER — Other Ambulatory Visit (INDEPENDENT_AMBULATORY_CARE_PROVIDER_SITE_OTHER): Payer: BLUE CROSS/BLUE SHIELD

## 2015-09-06 DIAGNOSIS — E785 Hyperlipidemia, unspecified: Secondary | ICD-10-CM

## 2015-09-06 DIAGNOSIS — I10 Essential (primary) hypertension: Secondary | ICD-10-CM | POA: Diagnosis not present

## 2015-09-06 DIAGNOSIS — R5383 Other fatigue: Secondary | ICD-10-CM | POA: Diagnosis not present

## 2015-09-06 LAB — LIPID PANEL
CHOL/HDL RATIO: 4
Cholesterol: 186 mg/dL (ref 0–200)
HDL: 46.8 mg/dL (ref >39.00)
LDL Cholesterol: 111 mg/dL — ABNORMAL HIGH (ref 0–99)
NONHDL: 139.17
TRIGLYCERIDES: 142 mg/dL (ref 0.0–149.0)
VLDL: 28.4 mg/dL (ref 0.0–40.0)

## 2015-09-06 LAB — COMPREHENSIVE METABOLIC PANEL
ALBUMIN: 4.2 g/dL (ref 3.5–5.2)
ALK PHOS: 58 U/L (ref 39–117)
ALT: 26 U/L (ref 0–53)
AST: 20 U/L (ref 0–37)
BILIRUBIN TOTAL: 0.7 mg/dL (ref 0.2–1.2)
BUN: 16 mg/dL (ref 6–23)
CO2: 28 mEq/L (ref 19–32)
Calcium: 9.1 mg/dL (ref 8.4–10.5)
Chloride: 106 mEq/L (ref 96–112)
Creatinine, Ser: 1.1 mg/dL (ref 0.40–1.50)
GFR: 72.85 mL/min (ref >60.00)
GLUCOSE: 102 mg/dL — AB (ref 70–99)
Potassium: 4 mEq/L (ref 3.5–5.1)
Sodium: 140 mEq/L (ref 135–145)
TOTAL PROTEIN: 6.7 g/dL (ref 6.0–8.3)

## 2015-09-06 LAB — TSH: TSH: 1.39 u[IU]/mL (ref 0.35–4.50)

## 2015-09-06 LAB — LDL CHOLESTEROL, DIRECT: Direct LDL: 128 mg/dL

## 2015-09-08 ENCOUNTER — Encounter: Payer: Self-pay | Admitting: Internal Medicine

## 2015-11-16 ENCOUNTER — Encounter: Payer: Self-pay | Admitting: Internal Medicine

## 2015-11-19 ENCOUNTER — Ambulatory Visit: Payer: BLUE CROSS/BLUE SHIELD

## 2015-11-19 ENCOUNTER — Encounter: Payer: Self-pay | Admitting: Podiatry

## 2015-11-19 ENCOUNTER — Ambulatory Visit (INDEPENDENT_AMBULATORY_CARE_PROVIDER_SITE_OTHER): Payer: BLUE CROSS/BLUE SHIELD | Admitting: Podiatry

## 2015-11-19 DIAGNOSIS — B070 Plantar wart: Secondary | ICD-10-CM

## 2015-11-19 DIAGNOSIS — R52 Pain, unspecified: Secondary | ICD-10-CM

## 2015-11-19 NOTE — Progress Notes (Signed)
   Subjective:    Patient ID: Walter Osborne, male    DOB: 1955-12-27, 60 y.o.   MRN: [id number]  HPI  60 year old male presents to the office today for concerns of a skin lesion to the right foot under the ball of his foot which has been worsening over the last 3 weeks. Denies stepping on any foreign objects. He has tried OTC treatment without any improvement. No redness, swelling or drainage.    Review of Systems  All other systems reviewed and are negative.      Objective:   Physical Exam General: AAO x3, NAD  Dermatological: Right foot submet 2 annular hyperkerotic lesion. Upon debridement there was pinpoint bleeding and there is evidence of verruca. There is no foreign body identified. There is no drinage or pus. No surrounding erythema or ascending cellulitis. No other lesions identified.   Vascular: Dorsalis Pedis artery and Posterior Tibial artery pedal pulses are 2/4 bilateral with immedate capillary fill time. Pedal hair growth present. There is no pain with calf compression, swelling, warmth, erythema.   Neruologic: Grossly intact via light touch bilateral. Vibratory intact via tuning fork bilateral. Protective threshold with Semmes Wienstein monofilament intact to all pedal sites bilateral.   Musculoskeletal: Tenderness to the lesion right submet 2. No other areas of tenderness bilaterally. No gross boney pedal deformities bilateral. No pain, crepitus, or limitation noted with foot and ankle range of motion bilateral. Muscular strength 5/5 in all groups tested bilateral.  Gait: Unassisted, Nonantalgic.      Assessment & Plan:  60 year old male right foot verruca -Treatment options discussed including all alternatives, risks, and complications -Etiology of symptoms were discussed -Lesion debrided without complications and cleaned. Canthrome was applied followed by an occlusive bandage. Post-[rocedure instructions discussed. Monitor for infection.  -Offloading pads  applied.  -Follow-up in 3-4 weeks or sooner if any problems arise. In the meantime, encouraged to call the office with any questions, concerns, change in symptoms.   Celesta Gentile, DPM

## 2015-11-19 NOTE — Patient Instructions (Signed)
Take dressing off in 8 hours and wash the foot with soap and water. If it is hurting or becomes uncomfortable before the 8 hours, go ahead and remove the bandage and wash the area.  If it blisters, apply antibiotic ointment and a band-aid.  Monitor for any signs/symptoms of infection. Call the office immediately if any occur or go directly to the emergency room. Call with any questions/concerns.   

## 2015-12-13 ENCOUNTER — Ambulatory Visit (INDEPENDENT_AMBULATORY_CARE_PROVIDER_SITE_OTHER): Payer: BLUE CROSS/BLUE SHIELD | Admitting: Podiatry

## 2015-12-13 ENCOUNTER — Encounter: Payer: Self-pay | Admitting: Podiatry

## 2015-12-13 VITALS — BP 176/89 | HR 59 | Resp 16

## 2015-12-13 DIAGNOSIS — B070 Plantar wart: Secondary | ICD-10-CM | POA: Diagnosis not present

## 2015-12-14 NOTE — Progress Notes (Signed)
Subjective: 60 year old male presents to the office today for follow-up evaluation of right foot wart. He states that it feels good and having no pain at all. No complications after the last treatment. No redness, swelling, drainage.  Denies any systemic complaints such as fevers, chills, nausea, vomiting. No acute changes since last appointment, and no other complaints at this time.   Objective: AAO x3, NAD DP/PT pulses palpable bilaterally, CRT less than 3 seconds On the right plantar forefoot is a hyperkeratotic lesion. Upon debridement. Appears to be resolved underlying skin is intact. There is no drainage or pus or any pain to the area. No new lesions identified. No open lesions or pre-ulcerative lesions.  No pain with calf compression, swelling, warmth, erythema  Assessment: Resolved verruca right foot  Plan: -All treatment options discussed with the patient including all alternatives, risks, complications.  -Lesion was debrided today and the area appears to be resolved. Monitor for any reoccurrence or new lesions.  -Follow-up as needed -Patient encouraged to call the office with any questions, concerns, change in symptoms.   Celesta Gentile, DPM

## 2016-03-01 ENCOUNTER — Ambulatory Visit: Payer: BLUE CROSS/BLUE SHIELD | Admitting: Internal Medicine

## 2016-03-03 ENCOUNTER — Telehealth: Payer: Self-pay

## 2016-03-03 ENCOUNTER — Other Ambulatory Visit: Payer: Self-pay | Admitting: Internal Medicine

## 2016-03-03 MED ORDER — ZOLPIDEM TARTRATE 10 MG PO TABS: ORAL_TABLET | ORAL | 5 refills | Status: DC

## 2016-03-03 MED ORDER — ATORVASTATIN CALCIUM 40 MG PO TABS: 40.0000 mg | ORAL_TABLET | Freq: Every day | ORAL | 0 refills | Status: DC

## 2016-03-03 NOTE — Telephone Encounter (Signed)
Last office visit 08/31/15 Next office visit 03/29/15

## 2016-03-03 NOTE — Telephone Encounter (Signed)
Pt called requesting refills on atorvastatin (LIPITOR) 40 MG tablet and zolpidem (AMBIEN) 10 MG tablet. Pt was scheduled for 03/01/16 but was reschedule due to weather for 2/13. Please advise, thank you!  [facility] [address] [place]  Call pt @ [phone]

## 2016-03-03 NOTE — Telephone Encounter (Signed)
Ambien sent to pharmacy 

## 2016-03-20 ENCOUNTER — Ambulatory Visit (INDEPENDENT_AMBULATORY_CARE_PROVIDER_SITE_OTHER): Payer: BLUE CROSS/BLUE SHIELD | Admitting: Internal Medicine

## 2016-03-20 ENCOUNTER — Encounter: Payer: Self-pay | Admitting: Internal Medicine

## 2016-03-20 VITALS — BP 128/80 | HR 69 | Resp 16 | Ht 73.0 in | Wt 243.0 lb

## 2016-03-20 DIAGNOSIS — R6882 Decreased libido: Secondary | ICD-10-CM

## 2016-03-20 DIAGNOSIS — Z23 Encounter for immunization: Secondary | ICD-10-CM

## 2016-03-20 DIAGNOSIS — R5383 Other fatigue: Secondary | ICD-10-CM | POA: Diagnosis not present

## 2016-03-20 DIAGNOSIS — I10 Essential (primary) hypertension: Secondary | ICD-10-CM

## 2016-03-20 DIAGNOSIS — E785 Hyperlipidemia, unspecified: Secondary | ICD-10-CM | POA: Diagnosis not present

## 2016-03-20 DIAGNOSIS — E782 Mixed hyperlipidemia: Secondary | ICD-10-CM

## 2016-03-20 DIAGNOSIS — E669 Obesity, unspecified: Secondary | ICD-10-CM

## 2016-03-20 MED ORDER — PROMETHAZINE HCL 12.5 MG PO TABS: 12.5000 mg | ORAL_TABLET | Freq: Three times a day (TID) | ORAL | 0 refills | Status: DC | PRN

## 2016-03-20 MED ORDER — CIPROFLOXACIN HCL 500 MG PO TABS: 500.0000 mg | ORAL_TABLET | Freq: Two times a day (BID) | ORAL | 0 refills | Status: DC

## 2016-03-20 MED ORDER — OSELTAMIVIR PHOSPHATE 75 MG PO CAPS: 75.0000 mg | ORAL_CAPSULE | Freq: Every day | ORAL | 0 refills | Status: DC

## 2016-03-20 NOTE — Patient Instructions (Addendum)
I want you to get your weight to 225 this year (to get your BMI < 30)     Good choice on getting the flu vaccine .  It is EPIDEMIC   Take the tamiflu with you on your trip    Start  The full dose of tamiflu (twice daily for 5 days)  If you get flu symptoms   Take just one tablet daily for 10 days if you get exposed to the flu (but no symptoms)  Take the Promethazine for nausea and vomiting  Take the Ciprofloxaxin for traveler's diarhea,  Or for a urinary tract infection   Return for fasting labs including testosterone level , and a nurse visit to measure blood pressure Bring your home bp machine with you so we can calibrate during a nurse visit

## 2016-03-20 NOTE — Progress Notes (Signed)
Pre visit review using our clinic review tool, if applicable. No additional management support is needed unless otherwise documented below in the visit note. 

## 2016-03-20 NOTE — Progress Notes (Signed)
Subjective:  Patient ID: Walter Osborne, male    DOB: 11-28-1955  Age: 61 y.o. MRN: [id number]  CC: The primary encounter diagnosis was Loss of libido. Diagnoses of Hyperlipidemia LDL goal <100, Fatigue, unspecified type, Need for influenza vaccination, Essential hypertension, Mixed hyperlipidemia, and Obesity (BMI 30-39.9) were also pertinent to this visit.  HPI Walter Osborne presents for follow up on hypertension and hyperlipidemia. Patient is taking his medications as prescribed and notes no adverse effects.  Home BP readings have been done about once per month  and are  generally < 140/80 .  he is avoiding added salt in her diet and working out at a gym with and without a trainer  about 4-5 times per week for exercise  .  Last LDL was 111 in July    Obesity:  Discussed current BMI   Needs to lose 18 lbs . Started weight watchers one week ago   Wt loss of  4 lbs by home scales since starting to reduce portion size   Not more than 2 glasses of wine daily.      Outpatient Medications Prior to Visit  Medication Sig Dispense Refill  . atorvastatin (LIPITOR) 40 MG tablet Take 1 tablet (40 mg total) by mouth daily. 90 tablet 0  . benazepril (LOTENSIN) 20 MG tablet Take 1 tablet (20 mg total) by mouth daily. 90 tablet 1  . fluticasone (FLONASE) 50 MCG/ACT nasal spray USE 2 SPRAYS EACH NOSTRIL DAILY 16 g 1  . hydrochlorothiazide (MICROZIDE) 12.5 MG capsule Take 1 capsule (12.5 mg total) by mouth daily. 90 capsule 1  . Loratadine (ALAVERT PO) Take by mouth. 1 dissolvable tablet as needed daily for allergies.    . polyethylene glycol powder (GLYCOLAX/MIRALAX) powder 255 grams one bottle for colonoscopy prep 255 g 0  . zolpidem (AMBIEN) 10 MG tablet TAKE ONE TABLET BY MOUTH AT BEDTIME AS NEEDED FOR SLEEP 30 tablet 5  . guaiFENesin-codeine 100-10 MG/5ML syrup Take 5 mLs by mouth 3 (three) times daily as needed. (Patient not taking: Reported on 03/20/2016) 120 mL 0   No  facility-administered medications prior to visit.     Review of Systems;  Patient denies headache, fevers, malaise, unintentional weight loss, skin rash, eye pain, sinus congestion and sinus pain, sore throat, dysphagia,  hemoptysis , cough, dyspnea, wheezing, chest pain, palpitations, orthopnea, edema, abdominal pain, nausea, melena, diarrhea, constipation, flank pain, dysuria, hematuria, urinary  Frequency, nocturia, numbness, tingling, seizures,  Focal weakness, Loss of consciousness,  Tremor, insomnia, depression, anxiety, and suicidal ideation.      Objective:  BP 128/80   Pulse 69   Resp 16   Ht 6\' 1"  (1.854 m)   Wt 243 lb (110.2 kg)   SpO2 96%   BMI 32.06 kg/m   BP Readings from Last 3 Encounters:  03/20/16 128/80  12/13/15 (!) 176/89  08/31/15 138/84    Wt Readings from Last 3 Encounters:  03/20/16 243 lb (110.2 kg)  08/31/15 239 lb 8 oz (108.6 kg)  07/27/14 238 lb 8 oz (108.2 kg)    General appearance: alert, cooperative and appears stated age Ears: normal TM's and external ear canals both ears Throat: lips, mucosa, and tongue normal; teeth and gums normal Neck: no adenopathy, no carotid bruit, supple, symmetrical, trachea midline and thyroid not enlarged, symmetric, no tenderness/mass/nodules Back: symmetric, no curvature. ROM normal. No CVA tenderness. Lungs: clear to auscultation bilaterally Heart: regular rate and rhythm, S1, S2 normal, no murmur, click, rub or  gallop Abdomen: soft, non-tender; bowel sounds normal; no masses,  no organomegaly Pulses: 2+ and symmetric Skin: Skin color, texture, turgor normal. No rashes or lesions Lymph nodes: Cervical, supraclavicular, and axillary nodes normal.  No results found for: HGBA1C  Lab Results  Component Value Date   CREATININE 1.10 09/06/2015   CREATININE 1.07 07/15/2014   CREATININE 1.1 01/22/2014    Lab Results  Component Value Date   WBC 6.1 06/30/2011   HGB 14.4 06/30/2011   HCT 42.0 06/30/2011    PLT 190.0 06/30/2011   GLUCOSE 102 (H) 09/06/2015   CHOL 186 09/06/2015   TRIG 142.0 09/06/2015   HDL 46.80 09/06/2015   LDLDIRECT 128.0 09/06/2015   LDLCALC 111 (H) 09/06/2015   ALT 26 09/06/2015   AST 20 09/06/2015   NA 140 09/06/2015   K 4.0 09/06/2015   CL 106 09/06/2015   CREATININE 1.10 09/06/2015   BUN 16 09/06/2015   CO2 28 09/06/2015   TSH 1.39 09/06/2015   PSA 1.99 09/03/2013   MICROALBUR 0.1 07/21/2013    No results found.  Assessment & Plan:   Problem List Items Addressed This Visit    Hyperlipidemia    Managed with  statin therapy.   He had raised his HDL considerably with diet and exercise at last check in July .  Liver enzymes are overdue and he will return in a week to have fadting labs repeated , no changes today.  Lab Results  Component Value Date   CHOL 186 09/06/2015   HDL 46.80 09/06/2015   LDLCALC 111 (H) 09/06/2015   LDLDIRECT 128.0 09/06/2015   TRIG 142.0 09/06/2015   CHOLHDL 4 09/06/2015   Lab Results  Component Value Date   ALT 26 09/06/2015   AST 20 09/06/2015   ALKPHOS 58 09/06/2015   BILITOT 0.7 09/06/2015             Hypertension    Mild elevation by current standards on stable regimen. Renal function is overdue,  no changes today   Patient has been asked to check BP at home   Lab Results  Component Value Date   CREATININE 1.10 09/06/2015   Lab Results  Component Value Date   NA 140 09/06/2015   K 4.0 09/06/2015   CL 106 09/06/2015   CO2 28 09/06/2015           Obesity (BMI 30-39.9)    I have addressed  BMI and recommended wt loss of 18 lbs  over the next 6 months using a low fat, low starch, high protein  fruit/vegetable based Mediterranean diet and 30 minutes of aerobic exercise a minimum of 5 days per week.         Other Visit Diagnoses    Loss of libido    -  Primary   Relevant Orders   Testos,Total,Free and SHBG (Male)   Hyperlipidemia LDL goal <100       Relevant Orders   Lipid panel   Fatigue,  unspecified type       Relevant Orders   Comprehensive metabolic panel   TSH   CBC with Differential/Platelet   Need for influenza vaccination       Relevant Orders   Flu Vaccine QUAD 36+ mos IM (Completed)     A total of 25 minutes of face to face time was spent with patient more than half of which was spent in counselling about the above mentioned conditions  and coordination of care  I am having  Mr. Kuo start on oseltamivir, ciprofloxacin, and promethazine. I am also having him maintain his Loratadine (ALAVERT PO), guaiFENesin-codeine, polyethylene glycol powder, fluticasone, hydrochlorothiazide, benazepril, zolpidem, and atorvastatin.  Meds ordered this encounter  Medications  . oseltamivir (TAMIFLU) 75 MG capsule    Sig: Take 1 capsule (75 mg total) by mouth daily.    Dispense:  10 capsule    Refill:  0  . ciprofloxacin (CIPRO) 500 MG tablet    Sig: Take 1 tablet (500 mg total) by mouth 2 (two) times daily.    Dispense:  14 tablet    Refill:  0  . promethazine (PHENERGAN) 12.5 MG tablet    Sig: Take 1 tablet (12.5 mg total) by mouth every 8 (eight) hours as needed for nausea or vomiting.    Dispense:  20 tablet    Refill:  0    There are no discontinued medications.  Follow-up: Return fasting labs and rn visit for bp check asap, for 6 months with Raeli Wiens .   Crecencio Mc, MD

## 2016-03-21 NOTE — Assessment & Plan Note (Signed)
I have addressed  BMI and recommended wt loss of 18 lbs  over the next 6 months using a low fat, low starch, high protein  fruit/vegetable based Mediterranean diet and 30 minutes of aerobic exercise a minimum of 5 days per week.

## 2016-03-21 NOTE — Assessment & Plan Note (Signed)
Mild elevation by current standards on stable regimen. Renal function is overdue,  no changes today   Patient has been asked to check BP at home   Lab Results  Component Value Date   CREATININE 1.10 09/06/2015   Lab Results  Component Value Date   NA 140 09/06/2015   K 4.0 09/06/2015   CL 106 09/06/2015   CO2 28 09/06/2015

## 2016-03-21 NOTE — Assessment & Plan Note (Signed)
Managed with  statin therapy.   He had raised his HDL considerably with diet and exercise at last check in July .  Liver enzymes are overdue and he will return in a week to have fadting labs repeated , no changes today.  Lab Results  Component Value Date   CHOL 186 09/06/2015   HDL 46.80 09/06/2015   LDLCALC 111 (H) 09/06/2015   LDLDIRECT 128.0 09/06/2015   TRIG 142.0 09/06/2015   CHOLHDL 4 09/06/2015   Lab Results  Component Value Date   ALT 26 09/06/2015   AST 20 09/06/2015   ALKPHOS 58 09/06/2015   BILITOT 0.7 09/06/2015

## 2016-03-28 ENCOUNTER — Encounter: Payer: Self-pay | Admitting: Internal Medicine

## 2016-03-30 ENCOUNTER — Other Ambulatory Visit: Payer: BLUE CROSS/BLUE SHIELD

## 2016-04-13 DIAGNOSIS — L820 Inflamed seborrheic keratosis: Secondary | ICD-10-CM | POA: Diagnosis not present

## 2016-04-26 ENCOUNTER — Encounter: Payer: Self-pay | Admitting: Internal Medicine

## 2016-05-16 ENCOUNTER — Other Ambulatory Visit: Payer: Self-pay | Admitting: Internal Medicine

## 2016-05-19 ENCOUNTER — Other Ambulatory Visit (INDEPENDENT_AMBULATORY_CARE_PROVIDER_SITE_OTHER): Payer: BLUE CROSS/BLUE SHIELD

## 2016-05-19 DIAGNOSIS — R6882 Decreased libido: Secondary | ICD-10-CM

## 2016-05-19 DIAGNOSIS — R5383 Other fatigue: Secondary | ICD-10-CM | POA: Diagnosis not present

## 2016-05-19 DIAGNOSIS — E785 Hyperlipidemia, unspecified: Secondary | ICD-10-CM

## 2016-05-19 LAB — COMPREHENSIVE METABOLIC PANEL
ALK PHOS: 66 U/L (ref 39–117)
ALT: 29 U/L (ref 0–53)
AST: 18 U/L (ref 0–37)
Albumin: 4.4 g/dL (ref 3.5–5.2)
BILIRUBIN TOTAL: 0.6 mg/dL (ref 0.2–1.2)
BUN: 18 mg/dL (ref 6–23)
CO2: 31 mEq/L (ref 19–32)
Calcium: 9.3 mg/dL (ref 8.4–10.5)
Chloride: 106 mEq/L (ref 96–112)
Creatinine, Ser: 1.08 mg/dL (ref 0.40–1.50)
GFR: 74.23 mL/min (ref >60.00)
GLUCOSE: 103 mg/dL — AB (ref 70–99)
Potassium: 4 mEq/L (ref 3.5–5.1)
SODIUM: 141 meq/L (ref 135–145)
TOTAL PROTEIN: 6.7 g/dL (ref 6.0–8.3)

## 2016-05-19 LAB — CBC WITH DIFFERENTIAL/PLATELET
BASOS ABS: 0.1 10*3/uL (ref 0.0–0.1)
Basophils Relative: 1.1 % (ref 0.0–3.0)
Eosinophils Absolute: 0.2 10*3/uL (ref 0.0–0.7)
Eosinophils Relative: 2.8 % (ref 0.0–5.0)
HEMATOCRIT: 44.5 % (ref 39.0–52.0)
Hemoglobin: 15.2 g/dL (ref 13.0–17.0)
LYMPHS PCT: 31.2 % (ref 12.0–46.0)
Lymphs Abs: 1.8 10*3/uL (ref 0.7–4.0)
MCHC: 34.3 g/dL (ref 30.0–36.0)
MCV: 92.3 fl (ref 78.0–100.0)
MONOS PCT: 8 % (ref 3.0–12.0)
Monocytes Absolute: 0.5 10*3/uL (ref 0.1–1.0)
Neutro Abs: 3.2 10*3/uL (ref 1.4–7.7)
Neutrophils Relative %: 56.9 % (ref 43.0–77.0)
Platelets: 182 10*3/uL (ref 150.0–400.0)
RBC: 4.82 Mil/uL (ref 4.22–5.81)
RDW: 13.2 % (ref 11.5–15.5)
WBC: 5.7 10*3/uL (ref 4.0–10.5)

## 2016-05-19 LAB — LIPID PANEL
CHOL/HDL RATIO: 3
Cholesterol: 165 mg/dL (ref 0–200)
HDL: 49.9 mg/dL (ref >39.00)
LDL Cholesterol: 95 mg/dL (ref 0–99)
NONHDL: 115.17
Triglycerides: 99 mg/dL (ref 0.0–149.0)
VLDL: 19.8 mg/dL (ref 0.0–40.0)

## 2016-05-19 LAB — TSH: TSH: 1.73 u[IU]/mL (ref 0.35–4.50)

## 2016-05-21 ENCOUNTER — Encounter: Payer: Self-pay | Admitting: Internal Medicine

## 2016-05-24 LAB — TESTOS,TOTAL,FREE AND SHBG (FEMALE)
SEX HORMONE BINDING GLOB.: 32 nmol/L (ref 22–77)
TESTOSTERONE,FREE: 56.4 pg/mL (ref 35.0–155.0)
TESTOSTERONE,TOTAL,LC/MS/MS: 437 ng/dL (ref 250–1100)

## 2016-05-25 ENCOUNTER — Encounter: Payer: Self-pay | Admitting: Internal Medicine

## 2016-06-19 ENCOUNTER — Encounter: Payer: Self-pay | Admitting: Internal Medicine

## 2016-06-19 NOTE — Telephone Encounter (Signed)
Last OV 03/20/16  Concerned loss of libido will patient need  New appointment?

## 2016-06-21 ENCOUNTER — Other Ambulatory Visit: Payer: Self-pay | Admitting: Internal Medicine

## 2016-06-21 MED ORDER — SILDENAFIL CITRATE 25 MG PO TABS: 25.0000 mg | ORAL_TABLET | Freq: Every day | ORAL | 5 refills | Status: DC | PRN

## 2016-06-21 NOTE — Progress Notes (Signed)
sildena

## 2016-07-03 ENCOUNTER — Encounter: Payer: Self-pay | Admitting: Internal Medicine

## 2016-07-28 ENCOUNTER — Encounter: Payer: Self-pay | Admitting: Internal Medicine

## 2016-07-31 ENCOUNTER — Other Ambulatory Visit: Payer: Self-pay | Admitting: Internal Medicine

## 2016-07-31 MED ORDER — SILDENAFIL CITRATE 20 MG PO TABS: 20.0000 mg | ORAL_TABLET | Freq: Three times a day (TID) | ORAL | 1 refills | Status: DC

## 2016-08-20 ENCOUNTER — Other Ambulatory Visit: Payer: Self-pay | Admitting: Internal Medicine

## 2016-09-18 ENCOUNTER — Encounter: Payer: Self-pay | Admitting: Internal Medicine

## 2016-09-18 ENCOUNTER — Ambulatory Visit (INDEPENDENT_AMBULATORY_CARE_PROVIDER_SITE_OTHER): Payer: BLUE CROSS/BLUE SHIELD | Admitting: Internal Medicine

## 2016-09-18 DIAGNOSIS — I10 Essential (primary) hypertension: Secondary | ICD-10-CM

## 2016-09-18 DIAGNOSIS — G4700 Insomnia, unspecified: Secondary | ICD-10-CM | POA: Diagnosis not present

## 2016-09-18 DIAGNOSIS — E669 Obesity, unspecified: Secondary | ICD-10-CM | POA: Diagnosis not present

## 2016-09-18 DIAGNOSIS — N529 Male erectile dysfunction, unspecified: Secondary | ICD-10-CM

## 2016-09-18 DIAGNOSIS — G4730 Sleep apnea, unspecified: Secondary | ICD-10-CM

## 2016-09-18 DIAGNOSIS — Z125 Encounter for screening for malignant neoplasm of prostate: Secondary | ICD-10-CM | POA: Diagnosis not present

## 2016-09-18 DIAGNOSIS — E782 Mixed hyperlipidemia: Secondary | ICD-10-CM

## 2016-09-18 MED ORDER — ZOLPIDEM TARTRATE 10 MG PO TABS: ORAL_TABLET | ORAL | 5 refills | Status: DC

## 2016-09-18 MED ORDER — ZOLPIDEM TARTRATE 10 MG PO TABS: ORAL_TABLET | ORAL | 0 refills | Status: DC

## 2016-09-18 MED ORDER — HYDROCHLOROTHIAZIDE 12.5 MG PO CAPS: 12.5000 mg | ORAL_CAPSULE | Freq: Every day | ORAL | 0 refills | Status: DC

## 2016-09-18 MED ORDER — ATORVASTATIN CALCIUM 40 MG PO TABS: 40.0000 mg | ORAL_TABLET | Freq: Every day | ORAL | 0 refills | Status: DC

## 2016-09-18 MED ORDER — BENAZEPRIL HCL 20 MG PO TABS: 20.0000 mg | ORAL_TABLET | Freq: Every day | ORAL | 0 refills | Status: DC

## 2016-09-18 NOTE — Progress Notes (Signed)
Subjective:  Patient ID: Walter Osborne, male    DOB: 01/23/1956  Age: 61 y.o. MRN: [id number]  CC: Diagnoses of Mixed hyperlipidemia, Essential hypertension, Insomnia w/ sleep apnea, Obesity (BMI 30-39.9), Screening for prostate cancer, and Erectile dysfunction, unspecified erectile dysfunction type were pertinent to this visit.  HPI Walter Osborne presents for  Annual preventive exam and follow up on hypertension, hyperlipidemia , obesity and ED.   Patient is taking his medications as prescribed and notes no adverse effects.  Home BP readings have been done about once per week and are  generally < 130/80 .  he is avoiding added salt in her diet and exercising regularly about 3 to 4  times per week  He is intentionally  losig weight using [employer] from [employer] (which provides prepared meals 5 meals daily, plys one home prepared "lean and green" meal for dinner).  Discussed traget weights for BMI < 30 and Under 25.  ED:  Generic sildenafil 20 mg covered and affordable by insurance ,  Received #90 with last rx, effective dose is 40 mg  But sex  is infrequent due to wife's dyspareunia    Going to [place] for 3 weeks in September,  Requesting paper prescriptions for all medications to take with him per travel advisory.  Using [place] for chronic insomnia.    Health maintenance issues reviewed  Overdue to prostate cancer screening Last colonoscopy : serrated adenoma 3 mm  No dysplasia,  Feb 2016 ([facility]) ; follow up 5 years per Up to Date   Outpatient Medications Prior to Visit  Medication Sig Dispense Refill  . fluticasone (FLONASE) 50 MCG/ACT nasal spray USE 2 SPRAYS EACH NOSTRIL DAILY 16 g 1  . Loratadine (ALAVERT PO) Take by mouth. 1 dissolvable tablet as needed daily for allergies.    . promethazine (PHENERGAN) 12.5 MG tablet Take 1 tablet (12.5 mg total) by mouth every 8 (eight) hours as needed for nausea or vomiting. 20 tablet 0  . sildenafil (REVATIO) 20 MG tablet Take 1  tablet (20 mg total) by mouth 3 (three) times daily. 90 tablet 1  . atorvastatin (LIPITOR) 40 MG tablet TAKE 1 TABLET BY MOUTH ONCE DAILY 90 tablet 0  . benazepril (LOTENSIN) 20 MG tablet Take 1 tablet (20 mg total) by mouth daily. 90 tablet 1  . hydrochlorothiazide (MICROZIDE) 12.5 MG capsule TAKE ONE CAPSULE BY MOUTH ONCE DAILY 90 capsule 1  . zolpidem (AMBIEN) 10 MG tablet TAKE ONE TABLET BY MOUTH AT BEDTIME AS NEEDED FOR SLEEP 30 tablet 5  . benazepril (LOTENSIN) 20 MG tablet TAKE ONE TABLET BY MOUTH ONCE DAILY (Patient not taking: Reported on 09/18/2016) 90 tablet 3  . ciprofloxacin (CIPRO) 500 MG tablet Take 1 tablet (500 mg total) by mouth 2 (two) times daily. (Patient not taking: Reported on 09/18/2016) 14 tablet 0  . guaiFENesin-codeine 100-10 MG/5ML syrup Take 5 mLs by mouth 3 (three) times daily as needed. (Patient not taking: Reported on 03/20/2016) 120 mL 0  . oseltamivir (TAMIFLU) 75 MG capsule Take 1 capsule (75 mg total) by mouth daily. (Patient not taking: Reported on 09/18/2016) 10 capsule 0  . polyethylene glycol powder (GLYCOLAX/MIRALAX) powder 255 grams one bottle for colonoscopy prep (Patient not taking: Reported on 09/18/2016) 255 g 0   No facility-administered medications prior to visit.        Review of Systems;  Patient denies headache, fevers, malaise, unintentional weight loss, skin rash, eye pain, sinus congestion and sinus pain, sore throat, dysphagia,  hemoptysis , cough, dyspnea, wheezing, chest pain, palpitations, orthopnea, edema, abdominal pain, nausea, melena, diarrhea, constipation, flank pain, dysuria, hematuria, urinary  Frequency, nocturia, numbness, tingling, seizures,  Focal weakness, Loss of consciousness,  Tremor, insomnia, depression, anxiety, and suicidal ideation.      Objective:  BP 128/86 (BP Location: Left Arm, Patient Position: Sitting, Cuff Size: Normal)   Pulse (!) 56   Temp 98.2 F (36.8 C) (Oral)   Resp 16   Ht 6\' 1"  (1.854 m)   Wt 232 lb  (105.2 kg)   SpO2 97%   BMI 30.61 kg/m   BP Readings from Last 3 Encounters:  09/18/16 128/86  03/20/16 128/80  12/13/15 (!) 176/89    Wt Readings from Last 3 Encounters:  09/18/16 232 lb (105.2 kg)  03/20/16 243 lb (110.2 kg)  08/31/15 239 lb 8 oz (108.6 kg)    General appearance: alert, cooperative and appears stated age Ears: normal TM's and external ear canals both ears Throat: lips, mucosa, and tongue normal; teeth and gums normal Neck: no adenopathy, no carotid bruit, supple, symmetrical, trachea midline and thyroid not enlarged, symmetric, no tenderness/mass/nodules Back: symmetric, no curvature. ROM normal. No CVA tenderness. Lungs: clear to auscultation bilaterally Heart: regular rate and rhythm, S1, S2 normal, no murmur, click, rub or gallop Abdomen: soft, non-tender; bowel sounds normal; no masses,  no organomegaly Pulses: 2+ and symmetric Skin: Skin color, texture, turgor normal. No rashes or lesions Lymph nodes: Cervical, supraclavicular, and axillary nodes normal.  No results found for: HGBA1C  Lab Results  Component Value Date   CREATININE 1.08 05/19/2016   CREATININE 1.10 09/06/2015   CREATININE 1.07 07/15/2014    Lab Results  Component Value Date   WBC 5.7 05/19/2016   HGB 15.2 05/19/2016   HCT 44.5 05/19/2016   PLT 182.0 05/19/2016   GLUCOSE 103 (H) 05/19/2016   CHOL 165 05/19/2016   TRIG 99.0 05/19/2016   HDL 49.90 05/19/2016   LDLDIRECT 128.0 09/06/2015   LDLCALC 95 05/19/2016   ALT 29 05/19/2016   AST 18 05/19/2016   NA 141 05/19/2016   K 4.0 05/19/2016   CL 106 05/19/2016   CREATININE 1.08 05/19/2016   BUN 18 05/19/2016   CO2 31 05/19/2016   TSH 1.73 05/19/2016   PSA 1.99 09/03/2013   MICROALBUR 0.1 07/21/2013    No results found.  Assessment & Plan:   Problem List Items Addressed This Visit    Screening for prostate cancer    He has not had a PSA in several years.  This will be added to October labs   Lab Results    Component Value Date   PSA 1.99 09/03/2013   PSA 1.56 04/22/2012         Relevant Orders   PSA   Obesity (BMI 30-39.9)    I have noted his reduction of   BMI using medifast  and encouraged  Continued weight loss with goal of 10% of body weight over the next 6 months / goal weight is 225 for BMI < 30 and 200 for BMI < 25       Insomnia w/ sleep apnea    Managed with ambien chronically averaging 6  Hours per night on CPAP .  Refills given for 6 months        Hypertension    Mild elevation by current standards due to diastolic readings.  New goals discussed ; Asked to check BP at home /pharmacy,  continue benazepril and hctz  Lab Results  Component Value Date   CREATININE 1.08 05/19/2016   Lab Results  Component Value Date   NA 141 05/19/2016   K 4.0 05/19/2016   CL 106 05/19/2016   CO2 31 05/19/2016           Relevant Medications   benazepril (LOTENSIN) 20 MG tablet   atorvastatin (LIPITOR) 40 MG tablet   hydrochlorothiazide (MICROZIDE) 12.5 MG capsule   Other Relevant Orders   Comprehensive metabolic panel   Microalbumin / creatinine urine ratio   Hyperlipidemia    Managed with  statin therapy.   He had raised his HDL considerably with diet and exercise at last check in April .  Liver enzymes were normal in April and will be due again in October .   no changes today.  Lab Results  Component Value Date   CHOL 165 05/19/2016   HDL 49.90 05/19/2016   LDLCALC 95 05/19/2016   LDLDIRECT 128.0 09/06/2015   TRIG 99.0 05/19/2016   CHOLHDL 3 05/19/2016   Lab Results  Component Value Date   ALT 29 05/19/2016   AST 18 05/19/2016   ALKPHOS 66 05/19/2016   BILITOT 0.6 05/19/2016             Relevant Medications   benazepril (LOTENSIN) 20 MG tablet   atorvastatin (LIPITOR) 40 MG tablet   hydrochlorothiazide (MICROZIDE) 12.5 MG capsule   Other Relevant Orders   Lipid panel   ED (erectile dysfunction)    Managed with sildenafil          I have  discontinued Mr. Delashmit guaiFENesin-codeine, polyethylene glycol powder, oseltamivir, and ciprofloxacin. I have also changed his atorvastatin and hydrochlorothiazide. Additionally, I am having him maintain his Loratadine (ALAVERT PO), fluticasone, promethazine, sildenafil, benazepril, and zolpidem.  Meds ordered this encounter  Medications  . benazepril (LOTENSIN) 20 MG tablet    Sig: Take 1 tablet (20 mg total) by mouth daily.    Dispense:  30 tablet    Refill:  0  . DISCONTD: zolpidem (AMBIEN) 10 MG tablet    Sig: TAKE ONE TABLET BY MOUTH AT BEDTIME AS NEEDED FOR SLEEP    Dispense:  30 tablet    Refill:  5  . atorvastatin (LIPITOR) 40 MG tablet    Sig: Take 1 tablet (40 mg total) by mouth daily.    Dispense:  30 tablet    Refill:  0  . zolpidem (AMBIEN) 10 MG tablet    Sig: TAKE ONE TABLET BY MOUTH AT BEDTIME AS NEEDED FOR SLEEP    Dispense:  30 tablet    Refill:  0  . hydrochlorothiazide (MICROZIDE) 12.5 MG capsule    Sig: Take 1 capsule (12.5 mg total) by mouth daily.    Dispense:  30 capsule    Refill:  0   A total of 40 minutes was spent with patient more than half of which was spent in counseling patient on the above mentioned issues , reviewing and explaining recent labs and imaging studies done, and coordination of care.  Medications Discontinued During This Encounter  Medication Reason  . benazepril (LOTENSIN) 20 MG tablet Duplicate  . ciprofloxacin (CIPRO) 500 MG tablet Patient has not taken in last 30 days  . guaiFENesin-codeine 100-10 MG/5ML syrup Patient has not taken in last 30 days  . oseltamivir (TAMIFLU) 75 MG capsule Patient has not taken in last 30 days  . polyethylene glycol powder (GLYCOLAX/MIRALAX) powder Patient has not taken in last 30 days  . benazepril (  LOTENSIN) 20 MG tablet Reorder  . zolpidem (AMBIEN) 10 MG tablet Reorder  . atorvastatin (LIPITOR) 40 MG tablet Reorder  . zolpidem (AMBIEN) 10 MG tablet Reorder  . hydrochlorothiazide (MICROZIDE)  12.5 MG capsule Reorder    Follow-up: Return for nonfasting labs due in october .   Crecencio Mc, MD

## 2016-09-18 NOTE — Patient Instructions (Addendum)
  The new goals for optimal blood pressure management are 120/70.  Please check your blood pressure a few times at home and let me know if your readings are > 130/80 so  I can determine if you need a change in medication      Goal for BMI < 30 is 225    200 for BMI of 29   You will need non fasting labs repeated in October

## 2016-09-19 DIAGNOSIS — N529 Male erectile dysfunction, unspecified: Secondary | ICD-10-CM | POA: Insufficient documentation

## 2016-09-19 NOTE — Assessment & Plan Note (Signed)
Managed with ambien chronically averaging 6  Hours per night on CPAP .  Refills given for 6 months   

## 2016-09-19 NOTE — Assessment & Plan Note (Signed)
Managed with sildenafil

## 2016-09-19 NOTE — Assessment & Plan Note (Signed)
Mild elevation by current standards due to diastolic readings.  New goals discussed ; Asked to check BP at home /pharmacy,  continue benazepril and hctz   Lab Results  Component Value Date   CREATININE 1.08 05/19/2016   Lab Results  Component Value Date   NA 141 05/19/2016   K 4.0 05/19/2016   CL 106 05/19/2016   CO2 31 05/19/2016

## 2016-09-19 NOTE — Assessment & Plan Note (Signed)
I have noted his reduction of   BMI using medifast  and encouraged  Continued weight loss with goal of 10% of body weight over the next 6 months / goal weight is 225 for BMI < 30 and 200 for BMI < 25

## 2016-09-19 NOTE — Assessment & Plan Note (Signed)
He has not had a PSA in several years.  This will be added to October labs   Lab Results  Component Value Date   PSA 1.99 09/03/2013   PSA 1.56 04/22/2012

## 2016-09-19 NOTE — Assessment & Plan Note (Signed)
Managed with  statin therapy.   He had raised his HDL considerably with diet and exercise at last check in April .  Liver enzymes were normal in April and will be due again in October .   no changes today.  Lab Results  Component Value Date   CHOL 165 05/19/2016   HDL 49.90 05/19/2016   LDLCALC 95 05/19/2016   LDLDIRECT 128.0 09/06/2015   TRIG 99.0 05/19/2016   CHOLHDL 3 05/19/2016   Lab Results  Component Value Date   ALT 29 05/19/2016   AST 18 05/19/2016   ALKPHOS 66 05/19/2016   BILITOT 0.6 05/19/2016

## 2016-10-02 ENCOUNTER — Encounter: Payer: Self-pay | Admitting: Internal Medicine

## 2016-10-03 MED ORDER — ZOLPIDEM TARTRATE 10 MG PO TABS: ORAL_TABLET | ORAL | 0 refills | Status: DC

## 2016-11-23 ENCOUNTER — Other Ambulatory Visit: Payer: BLUE CROSS/BLUE SHIELD

## 2016-11-29 ENCOUNTER — Other Ambulatory Visit (INDEPENDENT_AMBULATORY_CARE_PROVIDER_SITE_OTHER): Payer: BLUE CROSS/BLUE SHIELD

## 2016-11-29 DIAGNOSIS — I10 Essential (primary) hypertension: Secondary | ICD-10-CM

## 2016-11-29 DIAGNOSIS — Z125 Encounter for screening for malignant neoplasm of prostate: Secondary | ICD-10-CM

## 2016-11-29 DIAGNOSIS — E782 Mixed hyperlipidemia: Secondary | ICD-10-CM | POA: Diagnosis not present

## 2016-11-29 LAB — MICROALBUMIN / CREATININE URINE RATIO
Creatinine,U: 25.7 mg/dL
Microalb Creat Ratio: 2.7 mg/g (ref 0.0–30.0)
Microalb, Ur: <0.7 mg/dL (ref 0.0–1.9)

## 2016-11-29 LAB — COMPREHENSIVE METABOLIC PANEL
ALK PHOS: 63 U/L (ref 39–117)
ALT: 29 U/L (ref 0–53)
AST: 20 U/L (ref 0–37)
Albumin: 4.3 g/dL (ref 3.5–5.2)
BILIRUBIN TOTAL: 0.7 mg/dL (ref 0.2–1.2)
BUN: 17 mg/dL (ref 6–23)
CO2: 27 meq/L (ref 19–32)
CREATININE: 1.09 mg/dL (ref 0.40–1.50)
Calcium: 9.4 mg/dL (ref 8.4–10.5)
Chloride: 106 mEq/L (ref 96–112)
GFR: 73.31 mL/min (ref >60.00)
GLUCOSE: 102 mg/dL — AB (ref 70–99)
Potassium: 4.2 mEq/L (ref 3.5–5.1)
Sodium: 141 mEq/L (ref 135–145)
TOTAL PROTEIN: 6.7 g/dL (ref 6.0–8.3)

## 2016-11-29 LAB — LIPID PANEL
CHOL/HDL RATIO: 3
Cholesterol: 161 mg/dL (ref 0–200)
HDL: 51.9 mg/dL (ref >39.00)
LDL Cholesterol: 89 mg/dL (ref 0–99)
NONHDL: 108.86
Triglycerides: 98 mg/dL (ref 0.0–149.0)
VLDL: 19.6 mg/dL (ref 0.0–40.0)

## 2016-11-29 LAB — PSA: PSA: 3.38 ng/mL (ref 0.10–4.00)

## 2016-12-26 ENCOUNTER — Other Ambulatory Visit: Payer: Self-pay | Admitting: Internal Medicine

## 2016-12-29 ENCOUNTER — Other Ambulatory Visit: Payer: Self-pay | Admitting: Internal Medicine

## 2017-03-20 ENCOUNTER — Other Ambulatory Visit: Payer: Self-pay | Admitting: Internal Medicine

## 2017-03-20 DIAGNOSIS — E782 Mixed hyperlipidemia: Secondary | ICD-10-CM

## 2017-03-20 DIAGNOSIS — I10 Essential (primary) hypertension: Secondary | ICD-10-CM

## 2017-03-20 NOTE — Telephone Encounter (Signed)
He iwill be due for labs in April so I will refill until then but he needs to have  6 month follow up labs (fasting ) in April   Lab Results  Component Value Date   CHOL 161 11/29/2016   HDL 51.90 11/29/2016   LDLCALC 89 11/29/2016   LDLDIRECT 128.0 09/06/2015   TRIG 98.0 11/29/2016   CHOLHDL 3 11/29/2016   Lab Results  Component Value Date   ALT 29 11/29/2016   AST 20 11/29/2016   ALKPHOS 63 11/29/2016   BILITOT 0.7 11/29/2016   Lab Results  Component Value Date   PSA 3.38 11/29/2016   PSA 1.99 09/03/2013   PSA 1.56 04/22/2012   No results found for: HGBA1C

## 2017-03-20 NOTE — Telephone Encounter (Signed)
Refilled: 10/03/2016 Last OV: 09/18/2016 Next OV: not scheduled

## 2017-03-20 NOTE — Telephone Encounter (Signed)
Spoke with pt and informed him that Dr. Derrel Nip has refilled his medication until April when he is due for fasting lab work. Scheduled the pt for his fasting lab appt. Pt is aware of appt date and time.

## 2017-04-18 ENCOUNTER — Other Ambulatory Visit: Payer: Self-pay | Admitting: Internal Medicine

## 2017-04-18 MED ORDER — BENAZEPRIL HCL 20 MG PO TABS: 20.0000 mg | ORAL_TABLET | Freq: Every day | ORAL | 0 refills | Status: DC

## 2017-04-18 MED ORDER — SILDENAFIL CITRATE 20 MG PO TABS: 20.0000 mg | ORAL_TABLET | Freq: Three times a day (TID) | ORAL | 0 refills | Status: DC

## 2017-04-18 MED ORDER — ATORVASTATIN CALCIUM 40 MG PO TABS: 40.0000 mg | ORAL_TABLET | Freq: Every day | ORAL | 0 refills | Status: DC

## 2017-04-18 MED ORDER — HYDROCHLOROTHIAZIDE 12.5 MG PO CAPS: 12.5000 mg | ORAL_CAPSULE | Freq: Every day | ORAL | 0 refills | Status: DC

## 2017-04-18 MED ORDER — ZOLPIDEM TARTRATE 10 MG PO TABS: ORAL_TABLET | ORAL | 0 refills | Status: DC

## 2017-05-21 ENCOUNTER — Other Ambulatory Visit (INDEPENDENT_AMBULATORY_CARE_PROVIDER_SITE_OTHER): Payer: BLUE CROSS/BLUE SHIELD

## 2017-05-21 DIAGNOSIS — I10 Essential (primary) hypertension: Secondary | ICD-10-CM | POA: Diagnosis not present

## 2017-05-21 DIAGNOSIS — E782 Mixed hyperlipidemia: Secondary | ICD-10-CM

## 2017-05-21 LAB — COMPREHENSIVE METABOLIC PANEL
ALBUMIN: 3.9 g/dL (ref 3.5–5.2)
ALT: 28 U/L (ref 0–53)
AST: 22 U/L (ref 0–37)
Alkaline Phosphatase: 59 U/L (ref 39–117)
BUN: 16 mg/dL (ref 6–23)
CHLORIDE: 106 meq/L (ref 96–112)
CO2: 27 meq/L (ref 19–32)
CREATININE: 1.16 mg/dL (ref 0.40–1.50)
Calcium: 9.1 mg/dL (ref 8.4–10.5)
GFR: 68.12 mL/min (ref >60.00)
Glucose, Bld: 98 mg/dL (ref 70–99)
Potassium: 4.3 mEq/L (ref 3.5–5.1)
SODIUM: 140 meq/L (ref 135–145)
Total Bilirubin: 0.6 mg/dL (ref 0.2–1.2)
Total Protein: 6.6 g/dL (ref 6.0–8.3)

## 2017-05-21 LAB — LIPID PANEL
CHOL/HDL RATIO: 3
CHOLESTEROL: 132 mg/dL (ref 0–200)
HDL: 45.3 mg/dL (ref >39.00)
LDL CALC: 66 mg/dL (ref 0–99)
NONHDL: 86.82
Triglycerides: 102 mg/dL (ref 0.0–149.0)
VLDL: 20.4 mg/dL (ref 0.0–40.0)

## 2017-05-29 ENCOUNTER — Other Ambulatory Visit: Payer: Self-pay | Admitting: Internal Medicine

## 2017-05-29 NOTE — Telephone Encounter (Signed)
Refilled: 04/18/2017 Last OV: 09/18/2016 Next OV: 06/11/2017

## 2017-05-30 NOTE — Telephone Encounter (Signed)
Printed, signed and faxed.  

## 2017-06-11 ENCOUNTER — Encounter: Payer: Self-pay | Admitting: Internal Medicine

## 2017-06-11 ENCOUNTER — Ambulatory Visit (INDEPENDENT_AMBULATORY_CARE_PROVIDER_SITE_OTHER): Payer: BLUE CROSS/BLUE SHIELD | Admitting: Internal Medicine

## 2017-06-11 VITALS — BP 126/70 | HR 53 | Temp 98.0°F | Resp 15 | Ht 73.0 in | Wt 236.6 lb

## 2017-06-11 DIAGNOSIS — I10 Essential (primary) hypertension: Secondary | ICD-10-CM

## 2017-06-11 DIAGNOSIS — L989 Disorder of the skin and subcutaneous tissue, unspecified: Secondary | ICD-10-CM

## 2017-06-11 DIAGNOSIS — Z0000 Encounter for general adult medical examination without abnormal findings: Secondary | ICD-10-CM | POA: Diagnosis not present

## 2017-06-11 DIAGNOSIS — E782 Mixed hyperlipidemia: Secondary | ICD-10-CM

## 2017-06-11 DIAGNOSIS — D126 Benign neoplasm of colon, unspecified: Secondary | ICD-10-CM | POA: Insufficient documentation

## 2017-06-11 MED ORDER — ZOSTER VAC RECOMB ADJUVANTED 50 MCG/0.5ML IM SUSR: 0.5000 mL | Freq: Once | INTRAMUSCULAR | 1 refills | Status: AC

## 2017-06-11 MED ORDER — ZOLPIDEM TARTRATE 10 MG PO TABS: ORAL_TABLET | ORAL | 5 refills | Status: DC

## 2017-06-11 NOTE — Patient Instructions (Addendum)
Good to see you!  I'll see you in 6 months for BP checkup  FASTING LIPIDS   SCREENING FOR HIV , HEP C AND PROSTATE CANCER AFTER OCT  18TH    Health Maintenance, Male A healthy lifestyle and preventive care is important for your health and wellness. Ask your health care provider about what schedule of regular examinations is right for you. What should I know about weight and diet? Eat a Healthy Diet  Eat plenty of vegetables, fruits, whole grains, low-fat dairy products, and lean protein.  Do not eat a lot of foods high in solid fats, added sugars, or salt.  Maintain a Healthy Weight Regular exercise can help you achieve or maintain a healthy weight. You should:  Do at least 150 minutes of exercise each week. The exercise should increase your heart rate and make you sweat (moderate-intensity exercise).  Do strength-training exercises at least twice a week.  Watch Your Levels of Cholesterol and Blood Lipids  Have your blood tested for lipids and cholesterol every 5 years starting at 61 years of age. If you are at high risk for heart disease, you should start having your blood tested when you are 61 years old. You may need to have your cholesterol levels checked more often if: ? Your lipid or cholesterol levels are high. ? You are older than 61 years of age. ? You are at high risk for heart disease.  What should I know about cancer screening? Many types of cancers can be detected early and may often be prevented. Lung Cancer  You should be screened every year for lung cancer if: ? You are a current smoker who has smoked for at least 30 years. ? You are a former smoker who has quit within the past 15 years.  Talk to your health care provider about your screening options, when you should start screening, and how often you should be screened.  Colorectal Cancer  Routine colorectal cancer screening usually begins at 61 years of age and should be repeated every 5-10 years until you  are 61 years old. You may need to be screened more often if early forms of precancerous polyps or small growths are found. Your health care provider may recommend screening at an earlier age if you have risk factors for colon cancer.  Your health care provider may recommend using home test kits to check for hidden blood in the stool.  A small camera at the end of a tube can be used to examine your colon (sigmoidoscopy or colonoscopy). This checks for the earliest forms of colorectal cancer.  Prostate and Testicular Cancer  Depending on your age and overall health, your health care provider may do certain tests to screen for prostate and testicular cancer.  Talk to your health care provider about any symptoms or concerns you have about testicular or prostate cancer.  Skin Cancer  Check your skin from head to toe regularly.  Tell your health care provider about any new moles or changes in moles, especially if: ? There is a change in a mole's size, shape, or color. ? You have a mole that is larger than a pencil eraser.  Always use sunscreen. Apply sunscreen liberally and repeat throughout the day.  Protect yourself by wearing long sleeves, pants, a wide-brimmed hat, and sunglasses when outside.  What should I know about heart disease, diabetes, and high blood pressure?  If you are 78-65 years of age, have your blood pressure checked every 3-5 years.  If you are 100 years of age or older, have your blood pressure checked every year. You should have your blood pressure measured twice-once when you are at a hospital or clinic, and once when you are not at a hospital or clinic. Record the average of the two measurements. To check your blood pressure when you are not at a hospital or clinic, you can use: ? An automated blood pressure machine at a pharmacy. ? A home blood pressure monitor.  Talk to your health care provider about your target blood pressure.  If you are between 10-51 years old,  ask your health care provider if you should take aspirin to prevent heart disease.  Have regular diabetes screenings by checking your fasting blood sugar level. ? If you are at a normal weight and have a low risk for diabetes, have this test once every three years after the age of 32. ? If you are overweight and have a high risk for diabetes, consider being tested at a younger age or more often.  A one-time screening for abdominal aortic aneurysm (AAA) by ultrasound is recommended for men aged 56-75 years who are current or former smokers. What should I know about preventing infection? Hepatitis B If you have a higher risk for hepatitis B, you should be screened for this virus. Talk with your health care provider to find out if you are at risk for hepatitis B infection. Hepatitis C Blood testing is recommended for:  Everyone born from [birth year] through 1964.  Anyone with known risk factors for hepatitis C.  Sexually Transmitted Diseases (STDs)  You should be screened each year for STDs including gonorrhea and chlamydia if: ? You are sexually active and are younger than 61 years of age. ? You are older than 61 years of age and your health care provider tells you that you are at risk for this type of infection. ? Your sexual activity has changed since you were last screened and you are at an increased risk for chlamydia or gonorrhea. Ask your health care provider if you are at risk.  Talk with your health care provider about whether you are at high risk of being infected with HIV. Your health care provider may recommend a prescription medicine to help prevent HIV infection.  What else can I do?  Schedule regular health, dental, and eye exams.  Stay current with your vaccines (immunizations).  Do not use any tobacco products, such as cigarettes, chewing tobacco, and e-cigarettes. If you need help quitting, ask your health care provider.  Limit alcohol intake to no more than 2 drinks per  day. One drink equals 12 ounces of beer, 5 ounces of wine, or 1 ounces of hard liquor.  Do not use street drugs.  Do not share needles.  Ask your health care provider for help if you need support or information about quitting drugs.  Tell your health care provider if you often feel depressed.  Tell your health care provider if you have ever been abused or do not feel safe at home. This information is not intended to replace advice given to you by your health care provider. Make sure you discuss any questions you have with your health care provider. Document Released: 07/29/2007 Document Revised: 09/29/2015 Document Reviewed: 11/03/2014 Elsevier Interactive Patient Education  [employer].

## 2017-06-11 NOTE — Progress Notes (Signed)
Patient ID: Walter Osborne, male    DOB: 1955-07-21  Age: 62 y.o. MRN: [id number]  The patient is here for annual preventive  examination and management of other chronic and acute problems.  LAST CPE WAS jULY 2017 Lab Results  Component Value Date   PSA 3.38 11/29/2016   PSA 1.99 09/03/2013   PSA 1.56 04/22/2012   COLON 2016  3 MM SERRATED ADENOMA POLYP,  TICS   Fasting lipids 3 weeks ago LDL 66    The risk factors are reflected in the social history.  The roster of all physicians providing medical care to patient - is listed in the Snapshot section of the chart.  Activities of daily living:  The patient is 100% independent in all ADLs: dressing, toileting, feeding as well as independent mobility  Home safety : The patient has smoke detectors in the home. They wear seatbelts.  There are no firearms at home. There is no violence in the home.   There is no risks for hepatitis, STDs or HIV. There is no   history of blood transfusion. They have no travel history to infectious disease endemic areas of the world.  The patient has seen their dentist in the last six month. They have seen their eye doctor in the last year. They deny  hearing difficulty with regard to whispered voices and some television programs.   They do not  have excessive sun exposure. Discussed the need for sun protection: hats, long sleeves and use of sunscreen if there is significant sun exposure.   Diet: the importance of a healthy diet is discussed. They do have a healthy diet.  The benefits of regular aerobic exercise were discussed.  hE WALKS ON A TREADMILL 3 /WEEK AND LIFTS WEIGHTS  2 / WEEK    OSA managed by Walter Osborne   Depression screen: there are no signs or vegative symptoms of depression- irritability, change in appetite, anhedonia, sadness/tearfullness.  Cognitive assessment: the patient manages all their financial and personal affairs and is actively engaged. They could relate day,date,year and  events; recalled 2/3 objects at 3 minutes; performed clock-face test normally.  The following portions of the patient's history were reviewed and updated as appropriate: allergies, current medications, past family history, past medical history,  past surgical history, past social history  and problem list.  Visual acuity was not assessed per patient preference since she has regular follow up with her ophthalmologist. Hearing and body mass index were assessed and reviewed.   During the course of the visit the patient was educated and counseled about appropriate screening and preventive services including : fall prevention , diabetes screening, nutrition counseling, colorectal cancer screening, and recommended immunizations.    CC: The primary encounter diagnosis was Skin lesion of scalp. Diagnoses of Serrated adenoma of colon, Routine general medical examination at a health care facility, Essential hypertension, and Mixed hyperlipidemia were also pertinent to this visit.  Hypertension: patient checks blood pressure twice weekly at home.  Readings have been for the most part > 140/80 at rest . Patient is following a reduce salt diet most days and is taking medications as prescribed  Patient continues to have some trouble sleeping.  Wakes up 2 to 3 times per night . Bedtime hygiene reviewed,  Patient has not been using an electronic book to read before bed.  Does not drink caffeinated beverages after 3 PM.  Only voids  bladder once per night.  No snoring partner. Does not drink alcohol to excess.  Not exercising excessively in the evening. Patient does have a history of anxiety but does not lie awake worrying about issues that cannot be resolved. Does not take stimulants. .  [name]   He has no past surgical history on file.   [name]; [name]; Hyperlipidemia in his  mother.He reports that he quit smoking about 24 years ago. His smoking use included cigarettes. He has never used smokeless tobacco. He reports that he drinks about 4.2 oz of alcohol per week. He reports that he does not use drugs.  Outpatient Medications Prior to Visit  Medication Sig Dispense Refill  . atorvastatin (LIPITOR) 40 MG tablet Take 1 tablet (40 mg total) by mouth daily. 30 tablet 0  . benazepril (LOTENSIN) 20 MG tablet TAKE 1 TABLET BY MOUTH ONCE DAILY 90 tablet 1  . fluticasone (FLONASE) 50 MCG/ACT nasal spray USE 2 SPRAYS EACH NOSTRIL DAILY 16 g 1  . hydrochlorothiazide (MICROZIDE) 12.5 MG capsule Take 1 capsule (12.5 mg total) by mouth daily. 30 capsule 0  . Loratadine (ALAVERT PO) Take by mouth. 1 dissolvable tablet as needed daily for allergies.    . promethazine (PHENERGAN) 12.5 MG tablet Take 1 tablet (12.5 mg total) by mouth every 8 (eight) hours as needed for nausea or vomiting. 20 tablet 0  . sildenafil (REVATIO) 20 MG tablet Take 1 tablet (20 mg total) by mouth 3 (three) times daily. 30 tablet 0  . zolpidem (AMBIEN) 10 MG tablet TAKE 1 TABLET BY MOUTH AT BEDTIME AS NEEDED FOR SLEEP (OFFICE  VISIT  NEEDED  IN  APRIL  FOR  FASTING  LABS  PRIOR  TO  ANY  MORE  REFILLS) 30 tablet 0  . benazepril (LOTENSIN) 20 MG tablet Take 1 tablet (20 mg total) by mouth daily. (Patient not taking: Reported on 06/11/2017) 30 tablet 0   No facility-administered medications prior to visit.     Review of Systems   Patient denies headache, fevers, malaise, unintentional weight loss, skin rash, eye pain, sinus congestion and sinus pain, sore throat, dysphagia,  hemoptysis , cough, dyspnea, wheezing, chest pain, palpitations, orthopnea, edema, abdominal pain, nausea, melena, diarrhea, constipation, flank pain, dysuria, hematuria, urinary  Frequency, nocturia, numbness, tingling, seizures,  Focal weakness, Loss of consciousness,  Tremor, insomnia, depression, anxiety, and suicidal ideation.       Objective:  BP 126/70 (BP Location: Left Arm, Patient Position: Sitting, Cuff Size: Large)   Pulse (!) 53   Temp 98 F (36.7 C) (Oral)   Resp 15   Ht 6\' 1"  (1.854 m)   Wt 236 lb 9.6 oz (107.3 kg)   SpO2 95%   BMI 31.22 kg/m   Physical Exam   General appearance: alert, cooperative and appears stated age Ears: normal TM's and external ear canals both ears Throat: lips, mucosa, and tongue normal; teeth and gums normal Neck: no adenopathy, no carotid bruit, supple, symmetrical, trachea midline and thyroid not enlarged, symmetric, no tenderness/mass/nodules Back: symmetric, no curvature. ROM normal. No CVA tenderness. Lungs: clear to auscultation bilaterally Heart: regular rate and rhythm, S1, S2 normal, no murmur, click, rub or gallop Abdomen: soft, non-tender; bowel sounds normal; no masses,  no organomegaly Pulses: 2+ and symmetric Skin: several brown papular lesions on scalp.  Skin color, texture, turgor normal. Lymph nodes: Cervical, supraclavicular, and axillary nodes normal.    Assessment & Plan:   Problem List Items  Addressed This Visit    Serrated adenoma of colon   Routine general medical examination at a health care facility    Annual comprehensive preventive exam was done as well as an evaluation and management of acute and chronic conditions .  During the course of the visit the patient was educated and counseled about appropriate screening and preventive services including :  diabetes screening, lipid analysis with projected  10 year  risk for CAD , nutrition counseling, prostate and colorectal cancer screening, and recommended immunizations.  Printed recommendations for health maintenance screenings was given.       Hypertension    Well controlled on current regimen. Renal function stable, no changes today.  Lab Results  Component Value Date   CREATININE 1.16 05/21/2017   Lab Results  Component Value Date   NA 140 05/21/2017   K 4.3 05/21/2017   CL 106  05/21/2017   CO2 27 05/21/2017         Hyperlipidemia    Managed with  statin therapy.   He had raised his HDL considerably with diet and exercise at last check in April .  Liver enzymes were normal in April and will be due again in October .   no changes today.  Lab Results  Component Value Date   CHOL 132 05/21/2017   HDL 45.30 05/21/2017   LDLCALC 66 05/21/2017   LDLDIRECT 128.0 09/06/2015   TRIG 102.0 05/21/2017   CHOLHDL 3 05/21/2017   Lab Results  Component Value Date   ALT 28 05/21/2017   AST 22 05/21/2017   ALKPHOS 59 05/21/2017   BILITOT 0.6 05/21/2017              Other Visit Diagnoses    Skin lesion of scalp    -  Primary   Relevant Orders   Ambulatory referral to Dermatology      I have changed Walter Osborne "MIKE"'s zolpidem. I am also having him start on Zoster Vaccine Adjuvanted. Additionally, I am having him maintain his Loratadine (ALAVERT PO), fluticasone, promethazine, atorvastatin, hydrochlorothiazide, sildenafil, and benazepril.  Meds ordered this encounter  Medications  . zolpidem (AMBIEN) 10 MG tablet    Sig: TAKE 1 TABLET BY MOUTH AT BEDTIME AS NEEDED FOR INSOMNIA    Dispense:  30 tablet    Refill:  5  . Zoster Vaccine Adjuvanted [facility]) injection    Sig: Inject 0.5 mLs into the muscle once for 1 dose.    Dispense:  1 each    Refill:  1    Medications Discontinued During This Encounter  Medication Reason  . benazepril (LOTENSIN) 20 MG tablet Duplicate  . zolpidem (AMBIEN) 10 MG tablet Reorder    Follow-up: Return in about 6 months (around 12/11/2017) for HTN MED REFILL .   Crecencio Mc, MD

## 2017-06-12 NOTE — Assessment & Plan Note (Signed)
Managed with  statin therapy.   He had raised his HDL considerably with diet and exercise at last check in April .  Liver enzymes were normal in April and will be due again in October .   no changes today.  Lab Results  Component Value Date   CHOL 132 05/21/2017   HDL 45.30 05/21/2017   LDLCALC 66 05/21/2017   LDLDIRECT 128.0 09/06/2015   TRIG 102.0 05/21/2017   CHOLHDL 3 05/21/2017   Lab Results  Component Value Date   ALT 28 05/21/2017   AST 22 05/21/2017   ALKPHOS 59 05/21/2017   BILITOT 0.6 05/21/2017

## 2017-06-12 NOTE — Assessment & Plan Note (Signed)

## 2017-06-12 NOTE — Assessment & Plan Note (Signed)
Well controlled on current regimen. Renal function stable, no changes today.  Lab Results  Component Value Date   CREATININE 1.16 05/21/2017   Lab Results  Component Value Date   NA 140 05/21/2017   K 4.3 05/21/2017   CL 106 05/21/2017   CO2 27 05/21/2017

## 2017-06-28 DIAGNOSIS — L821 Other seborrheic keratosis: Secondary | ICD-10-CM | POA: Diagnosis not present

## 2017-06-28 DIAGNOSIS — B078 Other viral warts: Secondary | ICD-10-CM | POA: Diagnosis not present

## 2017-06-28 DIAGNOSIS — L570 Actinic keratosis: Secondary | ICD-10-CM | POA: Diagnosis not present

## 2017-06-28 DIAGNOSIS — X32XXXA Exposure to sunlight, initial encounter: Secondary | ICD-10-CM | POA: Diagnosis not present

## 2017-06-28 DIAGNOSIS — R238 Other skin changes: Secondary | ICD-10-CM | POA: Diagnosis not present

## 2017-09-06 ENCOUNTER — Other Ambulatory Visit: Payer: Self-pay | Admitting: Internal Medicine

## 2017-09-06 ENCOUNTER — Encounter: Payer: Self-pay | Admitting: Internal Medicine

## 2017-11-13 ENCOUNTER — Other Ambulatory Visit: Payer: Self-pay | Admitting: Internal Medicine

## 2017-12-05 ENCOUNTER — Telehealth: Payer: Self-pay | Admitting: Radiology

## 2017-12-05 DIAGNOSIS — Z125 Encounter for screening for malignant neoplasm of prostate: Secondary | ICD-10-CM

## 2017-12-05 DIAGNOSIS — Z79899 Other long term (current) drug therapy: Secondary | ICD-10-CM

## 2017-12-05 DIAGNOSIS — E785 Hyperlipidemia, unspecified: Secondary | ICD-10-CM

## 2017-12-05 NOTE — Telephone Encounter (Signed)
PT coming in for labs tomorrow, please place future orders. Thank you

## 2017-12-06 ENCOUNTER — Other Ambulatory Visit (INDEPENDENT_AMBULATORY_CARE_PROVIDER_SITE_OTHER): Payer: BLUE CROSS/BLUE SHIELD

## 2017-12-06 DIAGNOSIS — E785 Hyperlipidemia, unspecified: Secondary | ICD-10-CM | POA: Diagnosis not present

## 2017-12-06 DIAGNOSIS — Z125 Encounter for screening for malignant neoplasm of prostate: Secondary | ICD-10-CM

## 2017-12-06 DIAGNOSIS — Z79899 Other long term (current) drug therapy: Secondary | ICD-10-CM | POA: Diagnosis not present

## 2017-12-06 LAB — COMPREHENSIVE METABOLIC PANEL
ALK PHOS: 72 U/L (ref 39–117)
ALT: 50 U/L (ref 0–53)
AST: 32 U/L (ref 0–37)
Albumin: 4.1 g/dL (ref 3.5–5.2)
BILIRUBIN TOTAL: 0.5 mg/dL (ref 0.2–1.2)
BUN: 19 mg/dL (ref 6–23)
CO2: 27 meq/L (ref 19–32)
Calcium: 8.9 mg/dL (ref 8.4–10.5)
Chloride: 106 mEq/L (ref 96–112)
Creatinine, Ser: 1.15 mg/dL (ref 0.40–1.50)
GFR: 68.68 mL/min (ref >60.00)
GLUCOSE: 107 mg/dL — AB (ref 70–99)
Potassium: 4 mEq/L (ref 3.5–5.1)
Sodium: 142 mEq/L (ref 135–145)
Total Protein: 6.3 g/dL (ref 6.0–8.3)

## 2017-12-06 LAB — PSA: PSA: 2.15 ng/mL (ref 0.10–4.00)

## 2017-12-06 LAB — LIPID PANEL
CHOL/HDL RATIO: 4
CHOLESTEROL: 132 mg/dL (ref 0–200)
HDL: 34.8 mg/dL — AB (ref >39.00)
NonHDL: 97.34
Triglycerides: 208 mg/dL — ABNORMAL HIGH (ref 0.0–149.0)
VLDL: 41.6 mg/dL — AB (ref 0.0–40.0)

## 2017-12-06 LAB — LDL CHOLESTEROL, DIRECT: Direct LDL: 73 mg/dL

## 2017-12-10 DIAGNOSIS — M25522 Pain in left elbow: Secondary | ICD-10-CM | POA: Diagnosis not present

## 2017-12-11 ENCOUNTER — Ambulatory Visit (INDEPENDENT_AMBULATORY_CARE_PROVIDER_SITE_OTHER): Payer: BLUE CROSS/BLUE SHIELD | Admitting: Internal Medicine

## 2017-12-11 ENCOUNTER — Encounter: Payer: Self-pay | Admitting: Internal Medicine

## 2017-12-11 DIAGNOSIS — G4700 Insomnia, unspecified: Secondary | ICD-10-CM

## 2017-12-11 DIAGNOSIS — I10 Essential (primary) hypertension: Secondary | ICD-10-CM

## 2017-12-11 DIAGNOSIS — Z23 Encounter for immunization: Secondary | ICD-10-CM | POA: Diagnosis not present

## 2017-12-11 DIAGNOSIS — E782 Mixed hyperlipidemia: Secondary | ICD-10-CM

## 2017-12-11 DIAGNOSIS — G4730 Sleep apnea, unspecified: Secondary | ICD-10-CM | POA: Diagnosis not present

## 2017-12-11 MED ORDER — SILDENAFIL CITRATE 20 MG PO TABS: 20.0000 mg | ORAL_TABLET | Freq: Three times a day (TID) | ORAL | 11 refills | Status: DC

## 2017-12-11 MED ORDER — ZOSTER VAC RECOMB ADJUVANTED 50 MCG/0.5ML IM SUSR: 0.5000 mL | Freq: Once | INTRAMUSCULAR | 1 refills | Status: AC

## 2017-12-11 NOTE — Progress Notes (Signed)
Subjective:  Patient ID: Walter Osborne, male    DOB: [birth year]  Age: 62 y.o. MRN: [id number]  CC: Diagnoses of Mixed hyperlipidemia, Essential hypertension, and Insomnia w/ sleep apnea were pertinent to this visit.  HPI [name] presents for follow up on hypertension and hyperllipidemia  MEDIAL EPICONDYLITIS being treated at [facility] .  Started after training with Trish   Hypertension: patient checks blood pressure twice weekly at home.  Readings have been for the most part < 140/80 at rest . Patient is following a reduced salt diet most days and is taking medications as prescribed   Outpatient Medications Prior to Visit  Medication Sig Dispense Refill  . atorvastatin (LIPITOR) 40 MG tablet TAKE 1 TABLET BY MOUTH ONCE DAILY 90 tablet 1  . benazepril (LOTENSIN) 20 MG tablet TAKE 1 TABLET BY MOUTH ONCE DAILY 90 tablet 1  . fluticasone (FLONASE) 50 MCG/ACT nasal spray USE 2 SPRAYS EACH NOSTRIL DAILY 16 g 1  . hydrochlorothiazide (MICROZIDE) 12.5 MG capsule Take 1 capsule (12.5 mg total) by mouth daily. 30 capsule 0  . Loratadine (ALAVERT PO) Take by mouth. 1 dissolvable tablet as needed daily for allergies.    . promethazine (PHENERGAN) 12.5 MG tablet Take 1 tablet (12.5 mg total) by mouth every 8 (eight) hours as needed for nausea or vomiting. 20 tablet 0  . zolpidem (AMBIEN) 10 MG tablet TAKE 1 TABLET BY MOUTH AT BEDTIME AS NEEDED FOR INSOMNIA 30 tablet 5  . sildenafil (REVATIO) 20 MG tablet Take 1 tablet (20 mg total) by mouth 3 (three) times daily. 30 tablet 0  . atorvastatin (LIPITOR) 40 MG tablet Take 1 tablet (40 mg total) by mouth daily. (Patient not taking: Reported on 12/11/2017) 30 tablet 0  . hydrochlorothiazide (MICROZIDE) 12.5 MG capsule TAKE 1 CAPSULE BY MOUTH ONCE DAILY (Patient not taking: Reported on 12/11/2017) 90 capsule 1   No facility-administered medications prior to visit.     Review of Systems;  Patient denies headache, fevers, malaise,  unintentional weight loss, skin rash, eye pain, sinus congestion and sinus pain, sore throat, dysphagia,  hemoptysis , cough, dyspnea, wheezing, chest pain, palpitations, orthopnea, edema, abdominal pain, nausea, melena, diarrhea, constipation, flank pain, dysuria, hematuria, urinary  Frequency, nocturia, numbness, tingling, seizures,  Focal weakness, Loss of consciousness,  Tremor, insomnia, depression, anxiety, and suicidal ideation.      Objective:  BP 124/84 (BP Location: Left Arm, Patient Position: Sitting, Cuff Size: Large)   Pulse 65   Temp 98 F (36.7 C) (Oral)   Resp 16   Ht 6\' 1"  (1.854 m)   Wt 238 lb 6 oz (108.1 kg)   SpO2 97%   BMI 31.45 kg/m   BP Readings from Last 3 Encounters:  12/11/17 124/84  06/11/17 126/70  09/18/16 128/86    Wt Readings from Last 3 Encounters:  12/11/17 238 lb 6 oz (108.1 kg)  06/11/17 236 lb 9.6 oz (107.3 kg)  09/18/16 232 lb (105.2 kg)    General appearance: alert, cooperative and appears stated age Ears: normal TM's and external ear canals both ears Throat: lips, mucosa, and tongue normal; teeth and gums normal Neck: no adenopathy, no carotid bruit, supple, symmetrical, trachea midline and thyroid not enlarged, symmetric, no tenderness/mass/nodules Back: symmetric, no curvature. ROM normal. No CVA tenderness. Lungs: clear to auscultation bilaterally Heart: regular rate and rhythm, S1, S2 normal, no murmur, click, rub or gallop Abdomen: soft, non-tender; bowel sounds normal; no masses,  no organomegaly Pulses: 2+ and symmetric  Skin: Skin color, texture, turgor normal. No rashes or lesions Lymph nodes: Cervical, supraclavicular, and axillary nodes normal.  No results found for: HGBA1C  Lab Results  Component Value Date   CREATININE 1.15 12/06/2017   CREATININE 1.16 05/21/2017   CREATININE 1.09 11/29/2016    Lab Results  Component Value Date   WBC 5.7 05/19/2016   HGB 15.2 05/19/2016   HCT 44.5 05/19/2016   PLT 182.0  05/19/2016   GLUCOSE 107 (H) 12/06/2017   CHOL 132 12/06/2017   TRIG 208.0 (H) 12/06/2017   HDL 34.80 (L) 12/06/2017   LDLDIRECT 73.0 12/06/2017   LDLCALC 66 05/21/2017   ALT 50 12/06/2017   AST 32 12/06/2017   NA 142 12/06/2017   K 4.0 12/06/2017   CL 106 12/06/2017   CREATININE 1.15 12/06/2017   BUN 19 12/06/2017   CO2 27 12/06/2017   TSH 1.73 05/19/2016   PSA 2.15 12/06/2017   MICROALBUR <0.7 11/29/2016    No results found.  Assessment & Plan:   Problem List Items Addressed This Visit    Hyperlipidemia    Managed with  statin therapy.   He had raised his HDL considerably with diet and exercise at last check in April but it has dropped again.  Diet reviewed..  Liver enzymes are normal and LDL is at goal   Lab Results  Component Value Date   CHOL 132 12/06/2017   HDL 34.80 (L) 12/06/2017   LDLCALC 66 05/21/2017   LDLDIRECT 73.0 12/06/2017   TRIG 208.0 (H) 12/06/2017   CHOLHDL 4 12/06/2017   Lab Results  Component Value Date   ALT 50 12/06/2017   AST 32 12/06/2017   ALKPHOS 72 12/06/2017   BILITOT 0.5 12/06/2017             Relevant Medications   sildenafil (REVATIO) 20 MG tablet   Hypertension    Well controlled on current regimen. Renal function stable, no changes today.  Lab Results  Component Value Date   CREATININE 1.15 12/06/2017   Lab Results  Component Value Date   NA 142 12/06/2017   K 4.0 12/06/2017   CL 106 12/06/2017   CO2 27 12/06/2017         Relevant Medications   sildenafil (REVATIO) 20 MG tablet   Insomnia w/ sleep apnea    Managed with ambien chronically averaging 6  Hours per night on CPAP .  Refills given for 6 months           I am having [name]. Faidley "MIKE" start on Zoster Vaccine Adjuvanted. I am also having him maintain his Loratadine (ALAVERT PO), fluticasone, promethazine, hydrochlorothiazide, zolpidem, atorvastatin, benazepril, and sildenafil.  Meds ordered this encounter  Medications  . Zoster  Vaccine Adjuvanted [facility]) injection    Sig: Inject 0.5 mLs into the muscle once for 1 dose.    Dispense:  1 each    Refill:  1  . sildenafil (REVATIO) 20 MG tablet    Sig: Take 1 tablet (20 mg total) by mouth 3 (three) times daily.    Dispense:  30 tablet    Refill:  11    Medications Discontinued During This Encounter  Medication Reason  . atorvastatin (LIPITOR) 40 MG tablet Duplicate  . hydrochlorothiazide (MICROZIDE) 33.2 MG capsule Duplicate  . sildenafil (REVATIO) 20 MG tablet Reorder    Follow-up: Return in about 6 months (around 06/12/2018) for CPE.   Crecencio Mc, MD

## 2017-12-11 NOTE — Patient Instructions (Signed)
Your  fasting glucose has never been  diagnostic of diabetes; but it has been slightly elevated on several occasions.    The next time you have labs we'll do an  A1c to asses your risk for  developing type 2 Diabetes.    Avoiding refined sugar andContinuing the excellent lifestyle changes that you have made should delay the progression to diabetes for a long time.      Your HDL has dropped steadily over the last year.  Here's what I recommend:  -more olive oil,  Artichokes,  Avocados  And SEX!   I would like to see you again  In 6 months

## 2017-12-12 ENCOUNTER — Other Ambulatory Visit: Payer: Self-pay | Admitting: Internal Medicine

## 2017-12-12 DIAGNOSIS — Z23 Encounter for immunization: Secondary | ICD-10-CM

## 2017-12-12 DIAGNOSIS — E782 Mixed hyperlipidemia: Secondary | ICD-10-CM | POA: Diagnosis not present

## 2017-12-12 NOTE — Assessment & Plan Note (Signed)
Managed with  statin therapy.   He had raised his HDL considerably with diet and exercise at last check in April but it has dropped again.  Diet reviewed..  Liver enzymes are normal and LDL is at goal   Lab Results  Component Value Date   CHOL 132 12/06/2017   HDL 34.80 (L) 12/06/2017   LDLCALC 66 05/21/2017   LDLDIRECT 73.0 12/06/2017   TRIG 208.0 (H) 12/06/2017   CHOLHDL 4 12/06/2017   Lab Results  Component Value Date   ALT 50 12/06/2017   AST 32 12/06/2017   ALKPHOS 72 12/06/2017   BILITOT 0.5 12/06/2017

## 2017-12-12 NOTE — Telephone Encounter (Signed)
Refilled: 06/11/2017 Last OV: 12/11/2017 Next OV: 06/18/2018

## 2017-12-12 NOTE — Assessment & Plan Note (Signed)
Well controlled on current regimen. Renal function stable, no changes today.  Lab Results  Component Value Date   CREATININE 1.15 12/06/2017   Lab Results  Component Value Date   NA 142 12/06/2017   K 4.0 12/06/2017   CL 106 12/06/2017   CO2 27 12/06/2017

## 2017-12-12 NOTE — Assessment & Plan Note (Signed)
Managed with ambien chronically averaging 6  Hours per night on CPAP .  Refills given for 6 months

## 2017-12-13 DIAGNOSIS — M25522 Pain in left elbow: Secondary | ICD-10-CM | POA: Diagnosis not present

## 2017-12-17 DIAGNOSIS — M25522 Pain in left elbow: Secondary | ICD-10-CM | POA: Diagnosis not present

## 2017-12-20 DIAGNOSIS — M7702 Medial epicondylitis, left elbow: Secondary | ICD-10-CM | POA: Diagnosis not present

## 2018-03-06 ENCOUNTER — Other Ambulatory Visit: Payer: Self-pay

## 2018-03-06 ENCOUNTER — Other Ambulatory Visit: Payer: Self-pay | Admitting: Internal Medicine

## 2018-03-06 MED ORDER — HYDROCHLOROTHIAZIDE 12.5 MG PO CAPS: 12.5000 mg | ORAL_CAPSULE | Freq: Every day | ORAL | 0 refills | Status: DC

## 2018-04-23 DIAGNOSIS — J019 Acute sinusitis, unspecified: Secondary | ICD-10-CM | POA: Diagnosis not present

## 2018-04-23 DIAGNOSIS — J3489 Other specified disorders of nose and nasal sinuses: Secondary | ICD-10-CM | POA: Diagnosis not present

## 2018-04-23 DIAGNOSIS — R0982 Postnasal drip: Secondary | ICD-10-CM | POA: Diagnosis not present

## 2018-04-23 DIAGNOSIS — R51 Headache: Secondary | ICD-10-CM | POA: Diagnosis not present

## 2018-05-13 ENCOUNTER — Other Ambulatory Visit: Payer: Self-pay

## 2018-05-13 MED ORDER — HYDROCHLOROTHIAZIDE 12.5 MG PO CAPS: 12.5000 mg | ORAL_CAPSULE | Freq: Every day | ORAL | 0 refills | Status: DC

## 2018-05-13 MED ORDER — ATORVASTATIN CALCIUM 40 MG PO TABS: 40.0000 mg | ORAL_TABLET | Freq: Every day | ORAL | 0 refills | Status: DC

## 2018-05-14 ENCOUNTER — Other Ambulatory Visit: Payer: Self-pay | Admitting: Internal Medicine

## 2018-05-14 MED ORDER — ZOLPIDEM TARTRATE 10 MG PO TABS: 10.0000 mg | ORAL_TABLET | Freq: Every evening | ORAL | 2 refills | Status: DC | PRN

## 2018-05-14 MED ORDER — TELMISARTAN 40 MG PO TABS: 40.0000 mg | ORAL_TABLET | Freq: Every day | ORAL | 0 refills | Status: DC

## 2018-06-18 ENCOUNTER — Encounter: Payer: BLUE CROSS/BLUE SHIELD | Admitting: Internal Medicine

## 2018-07-22 ENCOUNTER — Telehealth: Payer: Self-pay | Admitting: *Deleted

## 2018-07-22 NOTE — Telephone Encounter (Signed)
Copied from [place] [id number]. Topic: General - Other >> Jul 22, 2018  2:59 PM Rutherford Nail, NT wrote: Reason for CRM: Patient returning call to Santiago Glad regarding [place] screening. Attempted office, no answer. Please advise.

## 2018-07-23 ENCOUNTER — Other Ambulatory Visit: Payer: Self-pay

## 2018-07-24 ENCOUNTER — Other Ambulatory Visit: Payer: Self-pay

## 2018-07-24 ENCOUNTER — Ambulatory Visit (INDEPENDENT_AMBULATORY_CARE_PROVIDER_SITE_OTHER): Payer: BC Managed Care – PPO | Admitting: Internal Medicine

## 2018-07-24 ENCOUNTER — Encounter: Payer: Self-pay | Admitting: Internal Medicine

## 2018-07-24 VITALS — BP 150/80 | HR 86 | Temp 98.2°F | Ht 73.0 in | Wt 242.2 lb

## 2018-07-24 DIAGNOSIS — Z0000 Encounter for general adult medical examination without abnormal findings: Secondary | ICD-10-CM

## 2018-07-24 DIAGNOSIS — I10 Essential (primary) hypertension: Secondary | ICD-10-CM

## 2018-07-24 DIAGNOSIS — Z9989 Dependence on other enabling machines and devices: Secondary | ICD-10-CM

## 2018-07-24 DIAGNOSIS — E7800 Pure hypercholesterolemia, unspecified: Secondary | ICD-10-CM

## 2018-07-24 DIAGNOSIS — G4733 Obstructive sleep apnea (adult) (pediatric): Secondary | ICD-10-CM

## 2018-07-24 DIAGNOSIS — Z7189 Other specified counseling: Secondary | ICD-10-CM

## 2018-07-24 NOTE — Patient Instructions (Addendum)
Your blood pressure is elevated today.  Our goal is 130/80 or less.  I want you to start checking it at home and send me 5 readings over the next several weeks.  [employer] makes the best home automattic machines for BP surveillance  60 ounces of water is your daily minimum requirement  Coronary calcium CT will be ordered   Health Maintenance, Male A healthy lifestyle and preventive care is important for your health and wellness. Ask your health care provider about what schedule of regular examinations is right for you. What should I know about weight and diet? Eat a Healthy Diet  Eat plenty of vegetables, fruits, whole grains, low-fat dairy products, and lean protein.  Do not eat a lot of foods high in solid fats, added sugars, or salt.  Maintain a Healthy Weight Regular exercise can help you achieve or maintain a healthy weight. You should:  Do at least 150 minutes of exercise each week. The exercise should increase your heart rate and make you sweat (moderate-intensity exercise).  Do strength-training exercises at least twice a week. Watch Your Levels of Cholesterol and Blood Lipids  Have your blood tested for lipids and cholesterol every 5 years starting at 62 years of age. If you are at high risk for heart disease, you should start having your blood tested when you are 62 years old. You may need to have your cholesterol levels checked more often if: ? Your lipid or cholesterol levels are high. ? You are older than 62 years of age. ? You are at high risk for heart disease. What should I know about cancer screening? Many types of cancers can be detected early and may often be prevented. Lung Cancer  You should be screened every year for lung cancer if: ? You are a current smoker who has smoked for at least 30 years. ? You are a former smoker who has quit within the past 15 years.  Talk to your health care provider about your screening options, when you should start screening, and  how often you should be screened. Colorectal Cancer  Routine colorectal cancer screening usually begins at 62 years of age and should be repeated every 5-10 years until you are 62 years old. You may need to be screened more often if early forms of precancerous polyps or small growths are found. Your health care provider may recommend screening at an earlier age if you have risk factors for colon cancer.  Your health care provider may recommend using home test kits to check for hidden blood in the stool.  A small camera at the end of a tube can be used to examine your colon (sigmoidoscopy or colonoscopy). This checks for the earliest forms of colorectal cancer. Prostate and Testicular Cancer  Depending on your age and overall health, your health care provider may do certain tests to screen for prostate and testicular cancer.  Talk to your health care provider about any symptoms or concerns you have about testicular or prostate cancer. Skin Cancer  Check your skin from head to toe regularly.  Tell your health care provider about any new moles or changes in moles, especially if: ? There is a change in a mole's size, shape, or color. ? You have a mole that is larger than a pencil eraser.  Always use sunscreen. Apply sunscreen liberally and repeat throughout the day.  Protect yourself by wearing long sleeves, pants, a wide-brimmed hat, and sunglasses when outside. What should I know about heart  disease, diabetes, and high blood pressure?  If you are 49-15 years of age, have your blood pressure checked every 3-5 years. If you are 65 years of age or older, have your blood pressure checked every year. You should have your blood pressure measured twice-once when you are at a hospital or clinic, and once when you are not at a hospital or clinic. Record the average of the two measurements. To check your blood pressure when you are not at a hospital or clinic, you can use: ? An automated blood  pressure machine at a pharmacy. ? A home blood pressure monitor.  Talk to your health care provider about your target blood pressure.  If you are between 15-37 years old, ask your health care provider if you should take aspirin to prevent heart disease.  Have regular diabetes screenings by checking your fasting blood sugar level. ? If you are at a normal weight and have a low risk for diabetes, have this test once every three years after the age of 16. ? If you are overweight and have a high risk for diabetes, consider being tested at a younger age or more often.  A one-time screening for abdominal aortic aneurysm (AAA) by ultrasound is recommended for men aged 4-75 years who are current or former smokers. What should I know about preventing infection? Hepatitis B If you have a higher risk for hepatitis B, you should be screened for this virus. Talk with your health care provider to find out if you are at risk for hepatitis B infection. Hepatitis C Blood testing is recommended for:  Everyone born from [birth year] through 1964.  Anyone with known risk factors for hepatitis C. Sexually Transmitted Diseases (STDs)  You should be screened each year for STDs including gonorrhea and chlamydia if: ? You are sexually active and are younger than 62 years of age. ? You are older than 62 years of age and your health care provider tells you that you are at risk for this type of infection. ? Your sexual activity has changed since you were last screened and you are at an increased risk for chlamydia or gonorrhea. Ask your health care provider if you are at risk.  Talk with your health care provider about whether you are at high risk of being infected with HIV. Your health care provider may recommend a prescription medicine to help prevent HIV infection. What else can I do?  Schedule regular health, dental, and eye exams.  Stay current with your vaccines (immunizations).  Do not use any tobacco  products, such as cigarettes, chewing tobacco, and e-cigarettes. If you need help quitting, ask your health care provider.  Limit alcohol intake to no more than 2 drinks per day. One drink equals 12 ounces of beer, 5 ounces of wine, or 1 ounces of hard liquor.  Do not use street drugs.  Do not share needles.  Ask your health care provider for help if you need support or information about quitting drugs.  Tell your health care provider if you often feel depressed.  Tell your health care provider if you have ever been abused or do not feel safe at home. This information is not intended to replace advice given to you by your health care provider. Make sure you discuss any questions you have with your health care provider. Document Released: 07/29/2007 Document Revised: 09/29/2015 Document Reviewed: 11/03/2014 Elsevier Interactive Patient Education  2019 [employer].

## 2018-07-24 NOTE — Progress Notes (Signed)
Patient ID: Walter Osborne, male    DOB: 1955/04/02  Age: 63 y.o. MRN: [id number]  The patient is here for annual preventive  examination and management of other chronic and acute problems.   The risk factors are reflected in the social history.  The roster of all physicians providing medical care to patient - is listed in the Snapshot section of the chart.  Activities of daily living:  The patient is 100% independent in all ADLs: dressing, toileting, feeding as well as independent mobility  Home safety : The patient has smoke detectors in the home. They wear seatbelts.  There are no firearms at home. There is no violence in the home.   There is no risks for hepatitis, STDs or HIV. There is no   history of blood transfusion. They have no travel history to infectious disease endemic areas of the world.  The patient has seen their dentist in the last six month. They have seen their eye doctor in the last year. They admit to slight hearing difficulty with regard to whispered voices and some television programs.  They have deferred audiologic testing in the last year.  They do not  have excessive sun exposure. Discussed the need for sun protection: hats, long sleeves and use of sunscreen if there is significant sun exposure.   Diet: the importance of a healthy diet is discussed. They do have a healthy diet.  The benefits of regular aerobic exercise were discussed. He is exercising less since the gyms have been closed .   Depression screen: there are no signs or vegative symptoms of depression- irritability, change in appetite, anhedonia, sadness/tearfullness.  Cognitive assessment: the patient manages all their financial and personal affairs and is actively engaged. They could relate day,date,year and events; recalled 2/3 objects at 3 minutes; performed clock-face test normally.  The following portions of the patient's history were reviewed and updated as appropriate: allergies, current  medications, past family history, past medical history,  past surgical history, past social history  and problem list.  Visual acuity was not assessed per patient preference since she has regular follow up with her ophthalmologist. Hearing and body mass index were assessed and reviewed.   During the course of the visit the patient was educated and counseled about appropriate screening and preventive services including : fall prevention , diabetes screening, nutrition counseling, colorectal cancer screening, and recommended immunizations.    CC: The primary encounter diagnosis was Pure hypercholesterolemia. Diagnoses of OSA on CPAP and Essential hypertension were also pertinent to this visit.  OSA:  Wearing CPAP 6-7 hours per night  Constipation for the past 6 months.  Still having a daily BM   Back ache . Not radiating beyond thigh   Hypertension: patient checks blood pressure twice weekly at home.  Readings have been for the most part > 140/80 at rest . Patient is following a reduce salt diet most days and is taking medications as prescribed   [name]   He has no past surgical history on file.   His family history includes CAD in his father; Cancer in his maternal aunt; [name]; [name].He reports that he quit smoking about 25 years ago. His smoking use included cigarettes. He has never used smokeless tobacco. He reports current alcohol use of about 7.0 standard drinks of alcohol per week. He reports that he does not use drugs.  Outpatient Medications Prior to Visit  Medication Sig Dispense Refill  . atorvastatin (LIPITOR) 40 MG tablet Take 1 tablet (40 mg total) by mouth daily. 90 tablet 0  . fluticasone (FLONASE) 50 MCG/ACT nasal spray USE 2 SPRAYS EACH NOSTRIL DAILY 16 g 1  . hydrochlorothiazide (MICROZIDE) 12.5 MG capsule Take 1 capsule (12.5 mg total) by mouth daily. 30 capsule  0  . hydrochlorothiazide (MICROZIDE) 12.5 MG capsule Take 1 capsule (12.5 mg total) by mouth daily. 90 capsule 0  . Loratadine (ALAVERT PO) Take by mouth. 1 dissolvable tablet as needed daily for allergies.    . promethazine (PHENERGAN) 12.5 MG tablet Take 1 tablet (12.5 mg total) by mouth every 8 (eight) hours as needed for nausea or vomiting. 20 tablet 0  . sildenafil (REVATIO) 20 MG tablet Take 1 tablet (20 mg total) by mouth 3 (three) times daily. 30 tablet 11  . telmisartan (MICARDIS) 40 MG tablet Take 1 tablet (40 mg total) by mouth at bedtime. 90 tablet 0  . zolpidem (AMBIEN) 10 MG tablet Take 1 tablet (10 mg total) by mouth at bedtime as needed for sleep. 30 tablet 2   No facility-administered medications prior to visit.     Review of Systems   Patient denies headache, fevers, malaise, unintentional weight loss, skin rash, eye pain, sinus congestion and sinus pain, sore throat, dysphagia,  hemoptysis , cough, dyspnea, wheezing, chest pain, palpitations, orthopnea, edema, abdominal pain, nausea, melena, diarrhea, constipation, flank pain, dysuria, hematuria, urinary  Frequency, nocturia, numbness, tingling, seizures,  Focal weakness, Loss of consciousness,  Tremor, insomnia, depression, anxiety, and suicidal ideation.      Objective:  BP (!) 150/80   Pulse 86   Temp 98.2 F (36.8 C) (Oral)   Ht 6\' 1"  (1.854 m)   Wt 242 lb 3.2 oz (109.9 kg)   SpO2 97%   BMI 31.95 kg/m   Physical Exam   General appearance: alert, cooperative and appears stated age Ears: normal TM's and external ear canals both ears Throat: lips, mucosa, and tongue normal; teeth and gums normal Neck: no adenopathy, no carotid bruit, supple, symmetrical, trachea midline and thyroid not enlarged, symmetric, no tenderness/mass/nodules Back: symmetric, no curvature. ROM normal. No CVA tenderness. Lungs: clear to auscultation bilaterally Heart: regular rate and rhythm, S1, S2 normal, no murmur, click, rub or  gallop Abdomen: soft, non-tender; bowel sounds normal; no masses,  no organomegaly Pulses: 2+ and symmetric Skin: Skin color, texture, turgor normal. No rashes or lesions Lymph nodes: Cervical, supraclavicular, and axillary nodes normal.    Assessment & Plan:   Problem List Items Addressed This Visit    Hypertension    .Well controlled on current regimen. Renal function stable, no changes today.  Lab Results  Component Value Date   CREATININE 1.19 07/24/2018   Lab Results  Component Value Date   NA 139 07/24/2018   K 4.1 07/24/2018   CL 103 07/24/2018   CO2 28 07/24/2018         Hyperlipidemia - Primary    Managed with  statin therapy.   He had raised his HDL considerably with diet and exercise at last check in April but it has dropped again.  Diet reviewed..  Liver enzymes are normal and LDL is at goal   Lab Results  Component Value Date   CHOL 132 12/06/2017   HDL 34.80 (L) 12/06/2017   LDLCALC 66 05/21/2017   LDLDIRECT 105.0 07/24/2018   TRIG 208.0 (H) 12/06/2017   CHOLHDL 4 12/06/2017   Lab Results  Component Value Date   ALT 41 07/24/2018   AST 37 07/24/2018   ALKPHOS 81 07/24/2018   BILITOT 0.7 07/24/2018             Relevant Orders   Comprehensive metabolic panel (Completed)   Direct LDL (Completed)   OSA on CPAP    Diagnosed by sleep study. he is wearing her CPAP every night a minimum of 6 hours per night and notes improved daytime wakefulness and decreased fatigue          I am having Walter Osborne. Walter "MIKE" maintain his Loratadine (ALAVERT PO), fluticasone, promethazine, sildenafil, hydrochlorothiazide, atorvastatin, hydrochlorothiazide, telmisartan, and zolpidem.  No orders of the defined types were placed in this encounter.   There are no discontinued medications.  Follow-up: No follow-ups on file.   Crecencio Mc, MD

## 2018-07-25 DIAGNOSIS — Z7189 Other specified counseling: Secondary | ICD-10-CM | POA: Insufficient documentation

## 2018-07-25 LAB — COMPREHENSIVE METABOLIC PANEL
ALT: 41 U/L (ref 0–53)
AST: 37 U/L (ref 0–37)
Albumin: 4.5 g/dL (ref 3.5–5.2)
Alkaline Phosphatase: 81 U/L (ref 39–117)
BUN: 18 mg/dL (ref 6–23)
CO2: 28 mEq/L (ref 19–32)
Calcium: 9.8 mg/dL (ref 8.4–10.5)
Chloride: 103 mEq/L (ref 96–112)
Creatinine, Ser: 1.19 mg/dL (ref 0.40–1.50)
GFR: 61.99 mL/min (ref >60.00)
Glucose, Bld: 85 mg/dL (ref 70–99)
Potassium: 4.1 mEq/L (ref 3.5–5.1)
Sodium: 139 mEq/L (ref 135–145)
Total Bilirubin: 0.7 mg/dL (ref 0.2–1.2)
Total Protein: 7.2 g/dL (ref 6.0–8.3)

## 2018-07-25 LAB — LDL CHOLESTEROL, DIRECT: Direct LDL: 105 mg/dL

## 2018-07-25 NOTE — Assessment & Plan Note (Signed)
.  Well controlled on current regimen. Renal function stable, no changes today.  Lab Results  Component Value Date   CREATININE 1.19 07/24/2018   Lab Results  Component Value Date   NA 139 07/24/2018   K 4.1 07/24/2018   CL 103 07/24/2018   CO2 28 07/24/2018

## 2018-07-25 NOTE — Assessment & Plan Note (Signed)

## 2018-07-25 NOTE — Assessment & Plan Note (Signed)
Diagnosed by sleep study. he is wearing her CPAP every night a minimum of 6 hours per night and notes improved daytime wakefulness and decreased fatigue  

## 2018-07-25 NOTE — Assessment & Plan Note (Signed)

## 2018-07-25 NOTE — Assessment & Plan Note (Signed)
Managed with  statin therapy.   He had raised his HDL considerably with diet and exercise at last check in April but it has dropped again.  Diet reviewed..  Liver enzymes are normal and LDL is at goal   Lab Results  Component Value Date   CHOL 132 12/06/2017   HDL 34.80 (L) 12/06/2017   LDLCALC 66 05/21/2017   LDLDIRECT 105.0 07/24/2018   TRIG 208.0 (H) 12/06/2017   CHOLHDL 4 12/06/2017   Lab Results  Component Value Date   ALT 41 07/24/2018   AST 37 07/24/2018   ALKPHOS 81 07/24/2018   BILITOT 0.7 07/24/2018

## 2018-08-12 ENCOUNTER — Ambulatory Visit (INDEPENDENT_AMBULATORY_CARE_PROVIDER_SITE_OTHER)
Admission: RE | Admit: 2018-08-12 | Discharge: 2018-08-12 | Disposition: A | Discharge status: Home / Self Care | Payer: BC Managed Care – PPO | Source: Ambulatory Visit

## 2018-08-12 DIAGNOSIS — M25471 Effusion, right ankle: Secondary | ICD-10-CM | POA: Diagnosis not present

## 2018-08-12 MED ORDER — PREDNISONE 50 MG PO TABS: 50.0000 mg | ORAL_TABLET | Freq: Every day | ORAL | 0 refills | Status: DC

## 2018-08-12 MED ORDER — CEPHALEXIN 500 MG PO CAPS: 500.0000 mg | ORAL_CAPSULE | Freq: Four times a day (QID) | ORAL | 0 refills | Status: AC

## 2018-08-12 NOTE — ED Provider Notes (Addendum)
Virtual Visit via Video Note:  Walter Osborne  initiated request for Telemedicine visit with [facility] team. I connected with Walter Osborne  on 08/12/2018 at 4:22 PM  for a synchronized telemedicine visit using a video enabled HIPPA compliant telemedicine application. I verified that I am speaking with Walter Osborne  using two identifiers. Allix Blomquist C Miken Stecher, PA-C  was physically located in a [place] [facility] site and Dairon Procter Tretter was located at a different location.   The limitations of evaluation and management by telemedicine as well as the availability of in-person appointments were discussed. Patient was informed that he  may incur a bill ( including co-pay) for this virtual visit encounter. Walter Osborne  expressed understanding and gave verbal consent to proceed with virtual visit.     History of Present Illness:Walter Osborne  is a 63 y.o. male presents with history of hypertension, hyperlipidemia, presenting today for evaluation of right ankle pain and swelling.  Patient states that over the past 2 days he has developed increased pain swelling and warmth to the inner aspect of his right ankle.  He denies any injury increase in activity.  He notes that his ankle feels very warm to touch.  Has pain with moving and weightbearing.  Denies spreading into calf, states that is localized around ankle.  Denies calf pain.  Denies previous DVT/PE.  Denies recent immobilization or hospitalization.  Denies tobacco use.  He has tried taking ibuprofen 400 mg every 6 hours without relief.  Denies history of gout.  Denies fevers, denies overlying scratches or wounds.  Past Medical History:  Diagnosis Date  . Hyperlipidemia   . Hypertension     No Known Allergies      Observations/Objective: Physical Exam  Constitutional: He is oriented to person, place, and time and well-developed, well-nourished, and in no distress. No distress.  HENT:   Head: Normocephalic and atraumatic.  Eyes: Conjunctivae are normal.  Neck: Normal range of motion. Neck supple.  Pulmonary/Chest: Effort normal. No respiratory distress.  Speaking in full sentences  Musculoskeletal:     Comments: Full active range of motion of ankle, denies tenderness when he squeezes his calf  Neurological: He is alert and oriented to person, place, and time.  Skin:  Area of her right medial malleolus does appear erythematous, no erythema observed extending into calf    Assessment and Plan: Based off patient's history and limited visualization of ankle symptoms concerning for cellulitis versus gout.  Symptoms do not seem to extend into calf and has negative risk factors for DVT.  Will cover for cellulitis with Keflex, will also treat with prednisone as he has not been responding to NSAIDs for gout.  Advised to follow-up in person in 2 to 3 days if not having any improvement with the above.  Follow-up sooner if redness pain and swelling extending more proximally into calf/leg, fevers.Discussed strict return precautions. Patient verbalized understanding and is agreeable with plan.   Follow Up Instructions:    I discussed the assessment and treatment plan with the patient. The patient was provided an opportunity to ask questions and all were answered. The patient agreed with the plan and demonstrated an understanding of the instructions.   The patient was advised to call back or seek an in-person evaluation if the symptoms worsen or if the condition fails to improve as anticipated.     Janith Lima, PA-C  08/12/2018 4:22 PM  Joneen Caraway, [place] C, PA-C 08/12/18 1625    Nickey Kloepfer, [place] C, PA-C 08/12/18 1626

## 2018-08-12 NOTE — Discharge Instructions (Signed)
Based off your ankle redness and swelling this is concerning for either infection or gout Begin Keflex 4 times a day for the next week Begin prednisone daily for the next 5 days with food, this will help decrease inflammation contributing from gout Keep leg elevated and ice to further help with swelling May try applying an Ace wrap  Please follow-up in person if you are not seeing any improvement in 48 hours, redness spreading, increased pain, swelling, symptoms spreading into calf and developing calf tenderness.  Persistent symptoms or fevers.

## 2018-08-19 ENCOUNTER — Other Ambulatory Visit: Payer: Self-pay

## 2018-08-20 ENCOUNTER — Other Ambulatory Visit: Payer: Self-pay | Admitting: Internal Medicine

## 2018-08-20 MED ORDER — HYDROCHLOROTHIAZIDE 12.5 MG PO CAPS: 12.5000 mg | ORAL_CAPSULE | Freq: Every day | ORAL | 1 refills | Status: DC

## 2018-08-20 MED ORDER — ATORVASTATIN CALCIUM 40 MG PO TABS: 40.0000 mg | ORAL_TABLET | Freq: Every day | ORAL | 1 refills | Status: DC

## 2018-08-20 MED ORDER — TELMISARTAN 40 MG PO TABS: 40.0000 mg | ORAL_TABLET | Freq: Every day | ORAL | 1 refills | Status: DC

## 2018-09-06 ENCOUNTER — Other Ambulatory Visit: Payer: Self-pay | Admitting: Internal Medicine

## 2018-09-06 MED ORDER — ZOLPIDEM TARTRATE 10 MG PO TABS: 10.0000 mg | ORAL_TABLET | Freq: Every evening | ORAL | 5 refills | Status: DC | PRN

## 2018-09-06 NOTE — Telephone Encounter (Signed)
Refills sent to [facility] [address]

## 2018-09-06 NOTE — Telephone Encounter (Deleted)
[facility] WILL NOT LET ME send to [facility] on [address]  ibuprofen cannot figure out what it wants me to do regarding the policy change  Please call in the refills 30 days with 5

## 2018-10-09 DIAGNOSIS — D2261 Melanocytic nevi of right upper limb, including shoulder: Secondary | ICD-10-CM | POA: Diagnosis not present

## 2018-10-09 DIAGNOSIS — D2262 Melanocytic nevi of left upper limb, including shoulder: Secondary | ICD-10-CM | POA: Diagnosis not present

## 2018-10-09 DIAGNOSIS — D485 Neoplasm of uncertain behavior of skin: Secondary | ICD-10-CM | POA: Diagnosis not present

## 2018-10-09 DIAGNOSIS — L820 Inflamed seborrheic keratosis: Secondary | ICD-10-CM | POA: Diagnosis not present

## 2018-10-09 DIAGNOSIS — D2271 Melanocytic nevi of right lower limb, including hip: Secondary | ICD-10-CM | POA: Diagnosis not present

## 2018-10-09 DIAGNOSIS — R208 Other disturbances of skin sensation: Secondary | ICD-10-CM | POA: Diagnosis not present

## 2018-10-09 DIAGNOSIS — C44712 Basal cell carcinoma of skin of right lower limb, including hip: Secondary | ICD-10-CM | POA: Diagnosis not present

## 2018-10-09 DIAGNOSIS — D225 Melanocytic nevi of trunk: Secondary | ICD-10-CM | POA: Diagnosis not present

## 2018-11-08 DIAGNOSIS — C44712 Basal cell carcinoma of skin of right lower limb, including hip: Secondary | ICD-10-CM | POA: Diagnosis not present

## 2018-12-04 ENCOUNTER — Encounter: Payer: Self-pay | Admitting: Family Medicine

## 2018-12-05 ENCOUNTER — Ambulatory Visit (INDEPENDENT_AMBULATORY_CARE_PROVIDER_SITE_OTHER): Payer: BC Managed Care – PPO | Admitting: Family Medicine

## 2018-12-05 ENCOUNTER — Encounter: Payer: Self-pay | Admitting: Family Medicine

## 2018-12-05 VITALS — Ht 73.0 in | Wt 231.0 lb

## 2018-12-05 DIAGNOSIS — B369 Superficial mycosis, unspecified: Secondary | ICD-10-CM

## 2018-12-05 MED ORDER — KETOCONAZOLE 2 % EX CREA: 1.0000 "application " | TOPICAL_CREAM | Freq: Every day | CUTANEOUS | 0 refills | Status: DC

## 2018-12-05 MED ORDER — NYSTATIN 100000 UNIT/GM EX POWD: CUTANEOUS | 0 refills | Status: DC

## 2018-12-05 NOTE — Progress Notes (Signed)
Patient ID: Walter Osborne, male   DOB: 08/31/1955, 63 y.o.   MRN: [id number]    Virtual Visit via video Note  This visit type was conducted due to national recommendations for restrictions regarding the COVID-19 pandemic (e.g. social distancing).  This format is felt to be most appropriate for this patient at this time.  All issues noted in this document were discussed and addressed.  No physical exam was performed (except for noted visual exam findings with Video Visits).   I connected with Neville Route today at  8:20 AM EDT by a video enabled telemedicine application  and verified that I am speaking with the correct person using two identifiers. Location patient: home Location provider: work or home office Persons participating in the virtual visit: patient, provider  I discussed the limitations, risks, security and privacy concerns of performing an evaluation and management service by telephone and the availability of in person appointments. I also discussed with the patient that there may be a patient responsible charge related to this service. The patient expressed understanding and agreed to proceed.  HPI:  Patient and I connected via video to discuss rash, states rash is along lower abdomen and underneath bilateral armpits.  Rash is not itchy or painful.  Denies any blisters, pustules or little vesicles.  Denies any new body products, detergents or places/foods.  Denies fever or chills.  Denies cough, shortness of breath or wheezing.  Denies chest pain.  Denies abdominal pain.  Denies nausea, vomiting or diarrhea.   ROS: See pertinent positives and negatives per HPI.  Past Medical History:  Diagnosis Date  . Hyperlipidemia   . Hypertension     History reviewed. No pertinent surgical history.  Family History  Problem Relation Age of Onset  . Hyperlipidemia Mother   . Diabetes Father   . CAD Father   . Cancer Maternal Aunt        Lung CA   Social History   Tobacco  Use  . Smoking status: Former Smoker    Types: Cigarettes    Quit date: 02/13/1993    Years since quitting: 25.8  . Smokeless tobacco: Never Used  Substance Use Topics  . Alcohol use: Yes    Alcohol/week: 7.0 standard drinks    Types: 7 Glasses of wine per week    Current Outpatient Medications:  .  atorvastatin (LIPITOR) 40 MG tablet, Take 1 tablet (40 mg total) by mouth daily., Disp: 90 tablet, Rfl: 1 .  fluticasone (FLONASE) 50 MCG/ACT nasal spray, USE 2 SPRAYS EACH NOSTRIL DAILY, Disp: 16 g, Rfl: 1 .  hydrochlorothiazide (MICROZIDE) 12.5 MG capsule, Take 1 capsule (12.5 mg total) by mouth daily., Disp: 90 capsule, Rfl: 1 .  Loratadine (ALAVERT PO), Take by mouth. 1 dissolvable tablet as needed daily for allergies., Disp: , Rfl:  .  sildenafil (REVATIO) 20 MG tablet, Take 1 tablet (20 mg total) by mouth 3 (three) times daily., Disp: 30 tablet, Rfl: 11 .  telmisartan (MICARDIS) 40 MG tablet, Take 1 tablet (40 mg total) by mouth at bedtime., Disp: 90 tablet, Rfl: 1 .  zolpidem (AMBIEN) 10 MG tablet, Take 1 tablet (10 mg total) by mouth at bedtime as needed for sleep., Disp: 30 tablet, Rfl: 5 .  ketoconazole (NIZORAL) 2 % cream, Apply 1 application topically daily. Use thin layer on affected rash areas. Do before bedtime., Disp: 60 g, Rfl: 0 .  nystatin (MYCOSTATIN/NYSTOP) powder, Apply topically every morning., Disp: 60 g, Rfl: 0 .  promethazine (  PHENERGAN) 12.5 MG tablet, Take 1 tablet (12.5 mg total) by mouth every 8 (eight) hours as needed for nausea or vomiting. (Patient not taking: Reported on 12/05/2018), Disp: 20 tablet, Rfl: 0  EXAM:  GENERAL: alert, oriented, appears well and in no acute distress  HEENT: atraumatic, conjunttiva clear, no obvious abnormalities on inspection of external nose and ears  NECK: normal movements of the head and neck  LUNGS: on inspection no signs of respiratory distress, breathing rate appears normal, no obvious gross SOB, gasping or wheezing  CV:  no obvious cyanosis  MS: moves all visible extremities without noticeable abnormality  SKIN: Difficult to do a really good skin assessment over video, but from what I can see through our video it appears faint red with some dry-patches. No obvious welts/wheals, blisters, open areas or drainage.   PSYCH/NEURO: pleasant and cooperative, no obvious depression or anxiety, speech and thought processing grossly intact  ASSESSMENT AND PLAN:  Discussed the following assessment and plan:  Rash-suspect rash is a fungal type rash due to and appearance of rash.  He will do topical antifungal cream once per day before bedtime and during the day he can use topical antifungal powder.  Advised if rash continues to persist we can consider office visit in clinic and/or referral to dermatology.  Also advised to keep watch for any products that he might find that are irritating the skin and avoid these products   I discussed the assessment and treatment plan with the patient. The patient was provided an opportunity to ask questions and all were answered. The patient agreed with the plan and demonstrated an understanding of the instructions.   The patient was advised to call back or seek an in-person evaluation if the symptoms worsen or if the condition fails to improve as anticipated.  Jodelle Green, FNP

## 2018-12-29 ENCOUNTER — Other Ambulatory Visit: Payer: Self-pay | Admitting: Internal Medicine

## 2019-01-02 NOTE — Telephone Encounter (Signed)
Pt is scheduled for 01/08/2019 at 8am for a virtual visit. Pt is aware.

## 2019-01-08 ENCOUNTER — Ambulatory Visit (INDEPENDENT_AMBULATORY_CARE_PROVIDER_SITE_OTHER): Payer: BC Managed Care – PPO | Admitting: Internal Medicine

## 2019-01-08 ENCOUNTER — Encounter: Payer: Self-pay | Admitting: Internal Medicine

## 2019-01-08 ENCOUNTER — Other Ambulatory Visit: Payer: Self-pay

## 2019-01-08 VITALS — Ht 73.0 in | Wt 231.0 lb

## 2019-01-08 DIAGNOSIS — E669 Obesity, unspecified: Secondary | ICD-10-CM

## 2019-01-08 DIAGNOSIS — E782 Mixed hyperlipidemia: Secondary | ICD-10-CM

## 2019-01-08 DIAGNOSIS — I10 Essential (primary) hypertension: Secondary | ICD-10-CM

## 2019-01-08 DIAGNOSIS — R21 Rash and other nonspecific skin eruption: Secondary | ICD-10-CM | POA: Diagnosis not present

## 2019-01-08 MED ORDER — TRIAMCINOLONE ACETONIDE 0.1 % EX CREA: 1.0000 "application " | TOPICAL_CREAM | Freq: Two times a day (BID) | CUTANEOUS | 0 refills | Status: DC

## 2019-01-08 NOTE — Assessment & Plan Note (Signed)
Based on home readings,  Well controlled on current regimen. Renal function os due next month, no changes today.

## 2019-01-08 NOTE — Assessment & Plan Note (Addendum)
Adding triamcinolone cream to nizoral given persistence x 3 weeks . Advised to cover areas with [facility]

## 2019-01-08 NOTE — Assessment & Plan Note (Signed)
I have congratulated him in reduction of   BMI and encouraged  Continued weight loss with goal of 10% of body weight over the next 6 months usingWeight Watchers method and a low glycemic  index diet and regular exercise a minimum of 5 days per week.

## 2019-01-08 NOTE — Progress Notes (Signed)
Virtual Visit via [employer]  This visit type was conducted due to national recommendations for restrictions regarding the COVID-19 pandemic (e.g. social distancing).  This format is felt to be most appropriate for this patient at this time.  All issues noted in this document were discussed and addressed.  No physical exam was performed (except for noted visual exam findings with Video Visits).   I connected with@ on 01/08/19 at  8:00 AM EST by a video enabled telemedicine application  and verified that I am speaking with the correct person using two identifiers. Location patient: home Location provider: work or home office Persons participating in the virtual visit: patient, provider  I discussed the limitations, risks, security and privacy concerns of performing an evaluation and management service by telephone and the availability of in person appointments. I also discussed with the patient that there may be a patient responsible charge related to this service. The patient expressed understanding and agreed to proceed.   Reason for visit: follow up on 1)  persistent rash 2) hypertension  3) overweight   HPI:   63 yr old male with hypertension,  hyperlipidemia,  OSA,  Following up on non pruritic rash that presented in axillary areas and lower abdomen treated on Oct 22  With nizoral cream and nystatin powder with no improvement except a very slight fading of the rash.  It does not ich and has not spread.  No new medications,  Plants,  Clothes,  Etc reviewed again   Hypertension: patient checks blood pressure once weekly at home.  Readings have been for the most   < 130/80 at rest . Patient is following a reduce salt diet most days and is taking medications as prescribed  Overweight:  He has lost ten lbs using Weight Watchers method   Chronic insomnia:  Continues to use ambien .    ROS: See pertinent positives and negatives per HPI.  Past Medical History:  Diagnosis Date  . Hyperlipidemia    . Hypertension     No past surgical history on file.  Family History  Problem Relation Age of Onset  . Hyperlipidemia Mother   . Diabetes Father   . CAD Father   . Cancer Maternal Aunt        Lung CA    SOCIAL HX:  reports that he quit smoking about 25 years ago. His smoking use included cigarettes. He has never used smokeless tobacco. He reports current alcohol use of about 7.0 standard drinks of alcohol per week. He reports that he does not use drugs.   Current Outpatient Medications:  .  atorvastatin (LIPITOR) 40 MG tablet, Take 1 tablet (40 mg total) by mouth daily., Disp: 90 tablet, Rfl: 1 .  fluticasone (FLONASE) 50 MCG/ACT nasal spray, USE 2 SPRAYS EACH NOSTRIL DAILY, Disp: 16 g, Rfl: 1 .  hydrochlorothiazide (MICROZIDE) 12.5 MG capsule, Take 1 capsule (12.5 mg total) by mouth daily., Disp: 90 capsule, Rfl: 1 .  ketoconazole (NIZORAL) 2 % cream, Apply 1 application topically daily. Use thin layer on affected rash areas. Do before bedtime., Disp: 60 g, Rfl: 0 .  Loratadine (ALAVERT PO), Take by mouth. 1 dissolvable tablet as needed daily for allergies., Disp: , Rfl:  .  nystatin (MYCOSTATIN/NYSTOP) powder, Apply topically every morning., Disp: 60 g, Rfl: 0 .  sildenafil (REVATIO) 20 MG tablet, Take 1 tablet (20 mg total) by mouth 3 (three) times daily., Disp: 30 tablet, Rfl: 11 .  telmisartan (MICARDIS) 40 MG tablet, TAKE 1  TABLET BY MOUTH AT BEDTIME, Disp: 90 tablet, Rfl: 0 .  zolpidem (AMBIEN) 10 MG tablet, Take 1 tablet (10 mg total) by mouth at bedtime as needed for sleep., Disp: 30 tablet, Rfl: 5 .  promethazine (PHENERGAN) 12.5 MG tablet, Take 1 tablet (12.5 mg total) by mouth every 8 (eight) hours as needed for nausea or vomiting. (Patient not taking: Reported on 12/05/2018), Disp: 20 tablet, Rfl: 0 .  triamcinolone cream (KENALOG) 0.1 %, Apply 1 application topically 2 (two) times daily. Until the rash resolves, Disp: 80 g, Rfl: 0  EXAM:  VITALS per patient if  applicable:  GENERAL: alert, oriented, appears well and in no acute distress  HEENT: atraumatic, conjunttiva clear, no obvious abnormalities on inspection of external nose and ears  NECK: normal movements of the head and neck  LUNGS: on inspection no signs of respiratory distress, breathing rate appears normal, no obvious gross SOB, gasping or wheezing  CV: no obvious cyanosis  MS: moves all visible extremities without noticeable abnormality  Skin:  Faint macular rash on both hip bones and both axillae  PSYCH/NEURO: pleasant and cooperative, no obvious depression or anxiety, speech and thought processing grossly intact  ASSESSMENT AND PLAN:  Discussed the following assessment and plan:  Mixed hyperlipidemia - Plan: Lipid panel  Essential hypertension - Plan: Comprehensive metabolic panel  Obesity (BMI 30-39.9)  Rash and nonspecific skin eruption  Hypertension Based on home readings,  Well controlled on current regimen. Renal function os due next month, no changes today.  Obesity (BMI 30-39.9) I have congratulated him in reduction of   BMI and encouraged  Continued weight loss with goal of 10% of body weight over the next 6 months usingWeight Watchers method and a low glycemic  index diet and regular exercise a minimum of 5 days per week.    Rash and nonspecific skin eruption Adding triamcinolone cream to nizoral given persistence x 3 weeks . Advised to cover areas with TELFA     I discussed the assessment and treatment plan with the patient. The patient was provided an opportunity to ask questions and all were answered. The patient agreed with the plan and demonstrated an understanding of the instructions.   The patient was advised to call back or seek an in-person evaluation if the symptoms worsen or if the condition fails to improve as anticipated.  I provided  25 minutes of non-face-to-face time during this encounter reviewing patient's current problems and post  surgeries.  Providing counseling on the above mentioned problems , and coordination  of care . Crecencio Mc, MD

## 2019-02-24 ENCOUNTER — Other Ambulatory Visit: Payer: Self-pay | Admitting: Internal Medicine

## 2019-02-25 ENCOUNTER — Other Ambulatory Visit: Payer: Self-pay | Admitting: Internal Medicine

## 2019-02-25 MED ORDER — DIAZEPAM 10 MG PO TABS: ORAL_TABLET | ORAL | 0 refills | Status: DC

## 2019-02-25 NOTE — Telephone Encounter (Signed)
Refilled: 09/06/2018 Last OV: 01/08/2019 Next OV: not scheduled

## 2019-03-04 ENCOUNTER — Ambulatory Visit: Payer: BC Managed Care – PPO | Attending: Internal Medicine

## 2019-03-04 DIAGNOSIS — Z20822 Contact with and (suspected) exposure to covid-19: Secondary | ICD-10-CM

## 2019-03-05 LAB — NOVEL CORONAVIRUS, NAA: SARS-CoV-2, NAA: NOT DETECTED

## 2019-04-12 ENCOUNTER — Other Ambulatory Visit: Payer: Self-pay | Admitting: Internal Medicine

## 2019-04-24 DIAGNOSIS — M10071 Idiopathic gout, right ankle and foot: Secondary | ICD-10-CM | POA: Diagnosis not present

## 2019-05-06 ENCOUNTER — Other Ambulatory Visit: Payer: Self-pay | Admitting: Internal Medicine

## 2019-05-08 DIAGNOSIS — Z08 Encounter for follow-up examination after completed treatment for malignant neoplasm: Secondary | ICD-10-CM | POA: Diagnosis not present

## 2019-05-08 DIAGNOSIS — L821 Other seborrheic keratosis: Secondary | ICD-10-CM | POA: Diagnosis not present

## 2019-05-08 DIAGNOSIS — D225 Melanocytic nevi of trunk: Secondary | ICD-10-CM | POA: Diagnosis not present

## 2019-05-08 DIAGNOSIS — L570 Actinic keratosis: Secondary | ICD-10-CM | POA: Diagnosis not present

## 2019-05-08 DIAGNOSIS — Z85828 Personal history of other malignant neoplasm of skin: Secondary | ICD-10-CM | POA: Diagnosis not present

## 2019-05-09 ENCOUNTER — Ambulatory Visit: Payer: BC Managed Care – PPO | Attending: Internal Medicine

## 2019-05-09 DIAGNOSIS — Z23 Encounter for immunization: Secondary | ICD-10-CM

## 2019-05-09 NOTE — Progress Notes (Signed)
   Covid-19 Vaccination Clinic  Name:  Walter Osborne    MRN: [id number] DOB: [birth year]  05/09/2019  Mr. Walter Osborne was observed post Covid-19 immunization for 15 minutes without incident. He was provided with Vaccine Information Sheet and instruction to access the V-Safe system.   Mr. Walter Osborne was instructed to call 911 with any severe reactions post vaccine: Marland Kitchen Difficulty breathing  . Swelling of face and throat  . A fast heartbeat  . A bad rash all over body  . Dizziness and weakness   Immunizations Administered    Name Date Dose VIS Date Route   Pfizer COVID-19 Vaccine 05/09/2019 12:25 PM 0.3 mL 01/24/2019 Intramuscular   Manufacturer: [place]   Lot: [id number]   [place]: [id number]

## 2019-05-13 ENCOUNTER — Other Ambulatory Visit: Payer: Self-pay

## 2019-05-13 NOTE — Telephone Encounter (Signed)
Refill request for Ambien, last seen 11-25/20, last filled 04-12-19.  Please advise.

## 2019-05-15 MED ORDER — ZOLPIDEM TARTRATE 10 MG PO TABS: 10.0000 mg | ORAL_TABLET | Freq: Every evening | ORAL | 2 refills | Status: DC | PRN

## 2019-06-03 ENCOUNTER — Ambulatory Visit: Payer: BC Managed Care – PPO | Attending: Internal Medicine

## 2019-06-03 DIAGNOSIS — Z23 Encounter for immunization: Secondary | ICD-10-CM

## 2019-06-03 NOTE — Progress Notes (Signed)
   Covid-19 Vaccination Clinic  Name:  Walter Osborne    MRN: [id number] DOB: 14-Sep-1955  06/03/2019  Mr. Walter Osborne was observed post Covid-19 immunization for 15 minutes without incident. He was provided with Vaccine Information Sheet and instruction to access the V-Safe system.   Mr. Walter Osborne was instructed to call 911 with any severe reactions post vaccine: Marland Kitchen Difficulty breathing  . Swelling of face and throat  . A fast heartbeat  . A bad rash all over body  . Dizziness and weakness   Immunizations Administered    Name Date Dose VIS Date Route   Pfizer COVID-19 Vaccine 06/03/2019 10:05 AM 0.3 mL 04/09/2018 Intramuscular   Manufacturer: [employer]   Lot: [id number]   [place]: [id number]

## 2019-08-08 ENCOUNTER — Other Ambulatory Visit: Payer: Self-pay | Admitting: Internal Medicine

## 2019-08-26 ENCOUNTER — Other Ambulatory Visit: Payer: Self-pay

## 2019-08-26 MED ORDER — ATORVASTATIN CALCIUM 40 MG PO TABS: 40.0000 mg | ORAL_TABLET | Freq: Every day | ORAL | 0 refills | Status: DC

## 2019-08-26 MED ORDER — HYDROCHLOROTHIAZIDE 12.5 MG PO CAPS: 12.5000 mg | ORAL_CAPSULE | Freq: Every day | ORAL | 1 refills | Status: DC

## 2019-08-26 MED ORDER — TELMISARTAN 40 MG PO TABS: 40.0000 mg | ORAL_TABLET | Freq: Every day | ORAL | 1 refills | Status: DC

## 2019-08-26 NOTE — Telephone Encounter (Signed)
Telmisartan and HCTZ has been sent in. Pt needing a refill on zolpidem.  Refilled: 05/15/2019 Last OV: 01/08/2019 Next OV: not scheduled

## 2019-08-28 MED ORDER — ZOLPIDEM TARTRATE 10 MG PO TABS: 10.0000 mg | ORAL_TABLET | Freq: Every evening | ORAL | 5 refills | Status: DC | PRN

## 2019-10-21 NOTE — Telephone Encounter (Signed)
Spoke with pt and he stated that he is not having an active gout flare at this time but that he would like to have something on hand to take if he does again. Pt stated that he is going to be out of town till tomorrow morning but will call back in the morning to schedule an appt with Dr. Derrel Nip.

## 2019-11-12 ENCOUNTER — Ambulatory Visit (INDEPENDENT_AMBULATORY_CARE_PROVIDER_SITE_OTHER): Payer: BC Managed Care – PPO | Admitting: Internal Medicine

## 2019-11-12 ENCOUNTER — Encounter: Payer: Self-pay | Admitting: Internal Medicine

## 2019-11-12 ENCOUNTER — Ambulatory Visit (INDEPENDENT_AMBULATORY_CARE_PROVIDER_SITE_OTHER): Payer: BC Managed Care – PPO

## 2019-11-12 ENCOUNTER — Other Ambulatory Visit: Payer: Self-pay

## 2019-11-12 VITALS — BP 130/88 | HR 62 | Temp 98.0°F | Resp 16 | Ht 73.0 in | Wt 243.2 lb

## 2019-11-12 DIAGNOSIS — Z86010 Personal history of colonic polyps: Secondary | ICD-10-CM

## 2019-11-12 DIAGNOSIS — Z0000 Encounter for general adult medical examination without abnormal findings: Secondary | ICD-10-CM | POA: Diagnosis not present

## 2019-11-12 DIAGNOSIS — R7301 Impaired fasting glucose: Secondary | ICD-10-CM | POA: Diagnosis not present

## 2019-11-12 DIAGNOSIS — E669 Obesity, unspecified: Secondary | ICD-10-CM

## 2019-11-12 DIAGNOSIS — M1712 Unilateral primary osteoarthritis, left knee: Secondary | ICD-10-CM | POA: Diagnosis not present

## 2019-11-12 DIAGNOSIS — M25562 Pain in left knee: Secondary | ICD-10-CM

## 2019-11-12 DIAGNOSIS — Z9989 Dependence on other enabling machines and devices: Secondary | ICD-10-CM

## 2019-11-12 DIAGNOSIS — E782 Mixed hyperlipidemia: Secondary | ICD-10-CM | POA: Diagnosis not present

## 2019-11-12 DIAGNOSIS — Z125 Encounter for screening for malignant neoplasm of prostate: Secondary | ICD-10-CM

## 2019-11-12 DIAGNOSIS — I10 Essential (primary) hypertension: Secondary | ICD-10-CM

## 2019-11-12 DIAGNOSIS — R5383 Other fatigue: Secondary | ICD-10-CM | POA: Diagnosis not present

## 2019-11-12 DIAGNOSIS — R7401 Elevation of levels of liver transaminase levels: Secondary | ICD-10-CM

## 2019-11-12 DIAGNOSIS — G4733 Obstructive sleep apnea (adult) (pediatric): Secondary | ICD-10-CM

## 2019-11-12 LAB — COMPREHENSIVE METABOLIC PANEL
ALT: 46 U/L (ref 0–53)
AST: 45 U/L — ABNORMAL HIGH (ref 0–37)
Albumin: 4.4 g/dL (ref 3.5–5.2)
Alkaline Phosphatase: 78 U/L (ref 39–117)
BUN: 15 mg/dL (ref 6–23)
CO2: 28 mEq/L (ref 19–32)
Calcium: 9.4 mg/dL (ref 8.4–10.5)
Chloride: 104 mEq/L (ref 96–112)
Creatinine, Ser: 0.99 mg/dL (ref 0.40–1.50)
GFR: 76.33 mL/min (ref >60.00)
Glucose, Bld: 93 mg/dL (ref 70–99)
Potassium: 4 mEq/L (ref 3.5–5.1)
Sodium: 140 mEq/L (ref 135–145)
Total Bilirubin: 0.7 mg/dL (ref 0.2–1.2)
Total Protein: 6.9 g/dL (ref 6.0–8.3)

## 2019-11-12 LAB — CBC WITH DIFFERENTIAL/PLATELET
Basophils Absolute: 0.1 10*3/uL (ref 0.0–0.1)
Basophils Relative: 1.1 % (ref 0.0–3.0)
Eosinophils Absolute: 0.2 10*3/uL (ref 0.0–0.7)
Eosinophils Relative: 3.6 % (ref 0.0–5.0)
HCT: 43.2 % (ref 39.0–52.0)
Hemoglobin: 15.1 g/dL (ref 13.0–17.0)
Lymphocytes Relative: 32.4 % (ref 12.0–46.0)
Lymphs Abs: 1.8 10*3/uL (ref 0.7–4.0)
MCHC: 35.1 g/dL (ref 30.0–36.0)
MCV: 92.7 fl (ref 78.0–100.0)
Monocytes Absolute: 0.5 10*3/uL (ref 0.1–1.0)
Monocytes Relative: 9.4 % (ref 3.0–12.0)
Neutro Abs: 3 10*3/uL (ref 1.4–7.7)
Neutrophils Relative %: 53.5 % (ref 43.0–77.0)
Platelets: 182 10*3/uL (ref 150.0–400.0)
RBC: 4.66 Mil/uL (ref 4.22–5.81)
RDW: 13.2 % (ref 11.5–15.5)
WBC: 5.7 10*3/uL (ref 4.0–10.5)

## 2019-11-12 LAB — VITAMIN B12: Vitamin B-12: 538 pg/mL (ref 211–911)

## 2019-11-12 LAB — IBC + FERRITIN
Ferritin: 115.4 ng/mL (ref 22.0–322.0)
Iron: 110 ug/dL (ref 42–165)
Saturation Ratios: 31.1 % (ref 20.0–50.0)
Transferrin: 253 mg/dL (ref 212.0–360.0)

## 2019-11-12 LAB — TSH: TSH: 1.48 u[IU]/mL (ref 0.35–4.50)

## 2019-11-12 LAB — LIPID PANEL
Cholesterol: 182 mg/dL (ref 0–200)
HDL: 57.9 mg/dL (ref >39.00)
LDL Cholesterol: 96 mg/dL (ref 0–99)
NonHDL: 123.94
Total CHOL/HDL Ratio: 3
Triglycerides: 139 mg/dL (ref 0.0–149.0)
VLDL: 27.8 mg/dL (ref 0.0–40.0)

## 2019-11-12 LAB — TESTOSTERONE: Testosterone: 330.09 ng/dL (ref 300.00–890.00)

## 2019-11-12 LAB — PSA: PSA: 2.34 ng/mL (ref 0.10–4.00)

## 2019-11-12 LAB — HEMOGLOBIN A1C: Hgb A1c MFr Bld: 5.7 % (ref 4.6–6.5)

## 2019-11-12 MED ORDER — HYDROCHLOROTHIAZIDE 25 MG PO TABS: 25.0000 mg | ORAL_TABLET | Freq: Every day | ORAL | 3 refills | Status: DC

## 2019-11-12 NOTE — Patient Instructions (Addendum)
1) To improve your tolerance of CPAP:  Address the allergies first:  Increase Alavert to every 12 hours  Add a shot of Afrin at bedtime   If the afrin helps,  Let me know and I will prescribe a steroid nasal spray   2)  Referral to Dr End in progress  3)  Referral to Orthopedics IF knee films show loss of joint space     4) The [employer] Diet is excellent for weight loss and prediabetes management..  Your goal is 225 lbs.    5) I have increased your hct)z to 25 mg daily.  Goal BP is  120/70   6)  If you use the advil/tylenol combination daily   You should protect stomach with use of OTC omeprazole

## 2019-11-12 NOTE — Progress Notes (Addendum)
Patient ID: Walter Osborne, male    DOB: Jul 02, 1955  Age: 64 y.o. MRN: [id number]  The patient is here for annual  wellness examination and management of other chronic and acute problems.   The risk factors are reflected in the social history.  The roster of all physicians providing medical care to patient - is listed in the Snapshot section of the chart.  Activities of daily living:  The patient is 100% independent in all ADLs: dressing, toileting, feeding as well as independent mobility  Home safety : The patient has smoke detectors in the home. They wear seatbelts.  There are no firearms at home. There is no violence in the home.   There is no risks for hepatitis, STDs or HIV. There is no   history of blood transfusion. They have no travel history to infectious disease endemic areas of the world.  The patient has seen their dentist in the last six month. They have seen their eye doctor in the last year. He denies  hearing difficulty with regard to whispered voices and some television programs.  They have deferred audiologic testing in the last year.  They do not  have excessive sun exposure. Discussed the need for sun protection: hats, long sleeves and use of sunscreen if there is significant sun exposure.   Diet: the importance of a healthy diet is discussed. They do have a moderately healthy diet.  The benefits of regular aerobic exercise were discussed. he walks 4 times per week ,  20 minutes.   Depression screen: there are no signs or vegative symptoms of depression- irritability, change in appetite, anhedonia, sadness/tearfullness.  Cognitive assessment: the patient manages all their financial and personal affairs and is actively engaged. They could relate day,date,year and events; recalled 2/3 objects at 3 minutes; performed clock-face test normally.  The following portions of the patient's history were reviewed and updated as appropriate: allergies, current medications, past  family history, past medical history,  past surgical history, past social history  and problem list.  Visual acuity was not assessed per patient preference since she has regular follow up with her ophthalmologist. Hearing and body mass index were assessed and reviewed.   During the course of the visit the patient was educated and counseled about appropriate screening and preventive services including : fall prevention , diabetes screening, nutrition counseling, colorectal cancer screening, and recommended immunizations.    CC: The primary encounter diagnosis was Routine general medical examination at a health care facility. Diagnoses of Impaired fasting glucose, Fatigue, unspecified type, Essential hypertension, Mixed hyperlipidemia, Prostate cancer screening, Left anterior knee pain, OSA on CPAP, Obesity (BMI 30-39.9), Personal history of colonic polyps, and Elevated AST (SGOT) were also pertinent to this visit.   1) Fatigue by end of day.  Wearing CPAP ,  But compliance report says the seal is faulty .  Has been more congested  At night and takes  Mask off.   72/100 last night,   6 averaging fewer hours attributes congestion to chronic allergic rhinitis: takes alavert    3) left knee pain,  Constant.  No history of trauma  Hurts constantly, in the anterior region ,  No swelling  Transient relief with 600 mg advil and 1000 mg tylenol  3) hYPERTENSION:  NOT CHECKING AT HOME .  Reports compliance with meds.  Reviewed last several office readings.  Advised to Increase hct to 25 mg daily   4) REFERRAL TO DR END  For cardiac CT  5) OBESITY:  rec the [place] for goal 225 lbs.  History Alberta has a past medical history of Hyperlipidemia and Hypertension.   He has no past surgical history on file.   His family history includes CAD in his father; Cancer in his maternal aunt; [name]; Heart attack (age of onset: 47) in his maternal grandfather; [name].He  reports that he quit smoking about 26 years ago. His smoking use included cigarettes. He quit after 10.00 years of use. He has never used smokeless tobacco. He reports current alcohol use of about 7.0 standard drinks of alcohol per week. He reports that he does not use drugs.  Outpatient Medications Prior to Visit  Medication Sig Dispense Refill  . atorvastatin (LIPITOR) 40 MG tablet Take 1 tablet (40 mg total) by mouth daily. 90 tablet 0  . diazepam (VALIUM) 10 MG tablet One tablet one hour before oral surgery 10 tablet 0  . fluticasone (FLONASE) 50 MCG/ACT nasal spray USE 2 SPRAYS EACH NOSTRIL DAILY 16 g 1  . ketoconazole (NIZORAL) 2 % cream Apply 1 application topically daily. Use thin layer on affected rash areas. Do before bedtime. 60 g 0  . Loratadine (ALAVERT PO) Take by mouth. 1 dissolvable tablet as needed daily for allergies.    Marland Kitchen nystatin (MYCOSTATIN/NYSTOP) powder Apply topically every morning. 60 g 0  . sildenafil (REVATIO) 20 MG tablet Take 1 tablet (20 mg total) by mouth 3 (three) times daily. 30 tablet 11  . telmisartan (MICARDIS) 40 MG tablet Take 1 tablet (40 mg total) by mouth at bedtime. 90 tablet 1  . triamcinolone cream (KENALOG) 0.1 % Apply 1 application topically 2 (two) times daily. Until the rash resolves 80 g 0  . zolpidem (AMBIEN) 10 MG tablet Take 1 tablet (10 mg total) by mouth at bedtime as needed. for sleep 30 tablet 5  . hydrochlorothiazide (MICROZIDE) 12.5 MG capsule Take 1 capsule (12.5 mg total) by mouth daily. 90 capsule 1  . promethazine (PHENERGAN) 12.5 MG tablet Take 1 tablet (12.5 mg total) by mouth every 8 (eight) hours as needed for nausea or vomiting. (Patient not taking: Reported on 12/05/2018) 20 tablet 0   No facility-administered medications prior to visit.    Review of Systems   Patient denies headache, fevers, malaise, unintentional weight loss, skin rash, eye pain, sinus congestion and sinus pain, sore throat, dysphagia,  hemoptysis , cough,  dyspnea, wheezing, chest pain, palpitations, orthopnea, edema, abdominal pain, nausea, melena, diarrhea, constipation, flank pain, dysuria, hematuria, urinary  Frequency, nocturia, numbness, tingling, seizures,  Focal weakness, Loss of consciousness,  Tremor, insomnia, depression, anxiety, and suicidal ideation.      Objective:  BP 130/88 (BP Location: Left Arm, Patient Position: Sitting, Cuff Size: Large)   Pulse 62   Temp 98 F (36.7 C) (Oral)   Resp 16   Ht 6\' 1"  (1.854 m)   Wt 243 lb 3.2 oz (110.3 kg)   SpO2 97%   BMI 32.09 kg/m   Physical Exam  General appearance: alert, cooperative and appears stated age Ears: normal TM's and external ear canals both ears Throat: lips, mucosa, and tongue normal; teeth and gums normal Neck: no adenopathy, no carotid bruit, supple, symmetrical, trachea midline and thyroid not enlarged, symmetric, no tenderness/mass/nodules Back: symmetric, no curvature. ROM normal. No CVA tenderness. Lungs: clear to auscultation bilaterally Heart: regular rate and rhythm, S1, S2 normal, no murmur, click, rub or gallop Abdomen: soft, non-tender; bowel sounds normal; no masses,  no organomegaly Pulses: 2+ and symmetric Skin: Skin color, texture, turgor normal. No rashes or lesions Lymph nodes: Cervical, supraclavicular, and axillary nodes normal.  Assessment & Plan:   Problem List Items Addressed This Visit      Unprioritized   Prostate cancer screening   Relevant Orders   PSA (Completed)   Hyperlipidemia    Managed with  Atorvastatin 40 mg daily .   He had raised his HDL considerably with diet and exercise .  Diet reviewed..  Liver enzymes are normal and LDL is at goal . No changes today   Lab Results  Component Value Date   CHOL 182 11/12/2019   HDL 57.90 11/12/2019   LDLCALC 96 11/12/2019   LDLDIRECT 105.0 07/24/2018   TRIG 139.0 11/12/2019   CHOLHDL 3 11/12/2019   Lab Results  Component Value Date   ALT 46 11/12/2019   AST 45 (H) 11/12/2019    ALKPHOS 78 11/12/2019   BILITOT 0.7 11/12/2019             Relevant Medications   hydrochlorothiazide (HYDRODIURIL) 25 MG tablet   Other Relevant Orders   Lipid panel (Completed)   TSH (Completed)   Hypertension    No at goal on telmisartan 80 mg daily and hctz 12.5 mg . No recent NSAID use . Increase dose to 25 mg daily. Lytes and cr normal.   Lab Results  Component Value Date   CREATININE 0.99 11/12/2019   Lab Results  Component Value Date   NA 140 11/12/2019   K 4.0 11/12/2019   CL 104 11/12/2019   CO2 28 11/12/2019         Relevant Medications   hydrochlorothiazide (HYDRODIURIL) 25 MG tablet   Left anterior knee pain    Present for  Months,  Transient relief with NSAIDs/tylenol .  Plain films report pending.  No signs of joint space narrowing by my review      Relevant Orders   DG Knee Complete 4 Views Left (Completed)   Obesity (BMI 30-39.9)    Reviewed rise in  BMI and encouraged adherence to [place] with regular exercise a minimum of 5 days per week.        OSA on CPAP    Compliance low per review of recent reports due to congestion and leakage.  Addressed congestion issues today      Routine general medical examination at a health care facility - Primary   Elevated AST (SGOT)   Relevant Orders   Hepatic function panel    Other Visit Diagnoses    Impaired fasting glucose       Relevant Orders   Hemoglobin A1c (Completed)   Fatigue, unspecified type       Relevant Orders   Vitamin B12 (Completed)   Testosterone (Completed)   CBC with Differential/Platelet (Completed)   IBC + Ferritin (Completed)   TSH (Completed)   Ambulatory referral to Cardiology   Personal history of colonic polyps       Relevant Orders   Ambulatory referral to Gastroenterology      I have discontinued Juanda Bond. Townley "MIKE"'s hydrochlorothiazide. I am also having him start on hydrochlorothiazide. Additionally, I am having him maintain his Loratadine  (ALAVERT PO), fluticasone, promethazine, sildenafil, ketoconazole, nystatin, triamcinolone cream, diazepam, atorvastatin, telmisartan, and zolpidem.  Meds ordered this encounter  Medications  . hydrochlorothiazide (HYDRODIURIL) 25 MG tablet    Sig: Take 1 tablet (25 mg total) by mouth daily.    Dispense:  90 tablet    Refill:  3    Medications Discontinued During This Encounter  Medication Reason  . hydrochlorothiazide (MICROZIDE) 12.5 MG capsule     Follow-up: No follow-ups on file.   Crecencio Mc, MD

## 2019-11-13 DIAGNOSIS — M25562 Pain in left knee: Secondary | ICD-10-CM | POA: Insufficient documentation

## 2019-11-13 NOTE — Assessment & Plan Note (Signed)
No at goal on telmisartan 80 mg daily and hctz 12.5 mg . No recent NSAID use . Increase dose to 25 mg daily. Lytes and cr normal.   Lab Results  Component Value Date   CREATININE 0.99 11/12/2019   Lab Results  Component Value Date   NA 140 11/12/2019   K 4.0 11/12/2019   CL 104 11/12/2019   CO2 28 11/12/2019

## 2019-11-13 NOTE — Assessment & Plan Note (Signed)
Present for  Months,  Transient relief with NSAIDs/tylenol .  Plain films report pending.  No signs of joint space narrowing by my review

## 2019-11-13 NOTE — Assessment & Plan Note (Signed)
Managed with  Atorvastatin 40 mg daily .   He had raised his HDL considerably with diet and exercise .  Diet reviewed..  Liver enzymes are normal and LDL is at goal . No changes today   Lab Results  Component Value Date   CHOL 182 11/12/2019   HDL 57.90 11/12/2019   LDLCALC 96 11/12/2019   LDLDIRECT 105.0 07/24/2018   TRIG 139.0 11/12/2019   CHOLHDL 3 11/12/2019   Lab Results  Component Value Date   ALT 46 11/12/2019   AST 45 (H) 11/12/2019   ALKPHOS 78 11/12/2019   BILITOT 0.7 11/12/2019

## 2019-11-13 NOTE — Assessment & Plan Note (Signed)
Compliance low per review of recent reports due to congestion and leakage.  Addressed congestion issues today

## 2019-11-13 NOTE — Assessment & Plan Note (Signed)
Reviewed rise in  BMI and encouraged adherence to [place] with regular exercise a minimum of 5 days per week.

## 2019-11-14 ENCOUNTER — Ambulatory Visit (INDEPENDENT_AMBULATORY_CARE_PROVIDER_SITE_OTHER): Payer: BC Managed Care – PPO | Admitting: Cardiology

## 2019-11-14 ENCOUNTER — Other Ambulatory Visit: Payer: Self-pay

## 2019-11-14 ENCOUNTER — Encounter: Payer: Self-pay | Admitting: Cardiology

## 2019-11-14 VITALS — BP 130/80 | HR 65 | Ht 73.0 in | Wt 244.4 lb

## 2019-11-14 DIAGNOSIS — I10 Essential (primary) hypertension: Secondary | ICD-10-CM

## 2019-11-14 DIAGNOSIS — Z7189 Other specified counseling: Secondary | ICD-10-CM | POA: Diagnosis not present

## 2019-11-14 DIAGNOSIS — E7800 Pure hypercholesterolemia, unspecified: Secondary | ICD-10-CM

## 2019-11-14 NOTE — Progress Notes (Signed)
Cardiology Office Note:    Date:  11/14/2019   ID:  Walter, Osborne May 23, 1955, MRN [id number]  PCP:  Crecencio Mc, MD  [facility] Cardiologist:  Kate Sable, MD  [facility] Electrophysiologist:  None   Referring MD: Crecencio Mc, MD   Chief Complaint  Patient presents with  . New Patient (Initial Visit)    Ref by Dr. Derrel Nip for family Hx. of MI; his dad had an MI at age 89 and Maternal grandfather at age 70. Meds reviewed by the pt. verbally. Pt. c/o feeling fatigue more than usual and has a Hx. of hyperlipidemia and HTN.    Walter Osborne is a 64 y.o. male who is being seen today for the evaluation of fatigue at the request of Derrel Nip Walter Everts, MD.   History of Present Illness:    Walter Osborne is a 64 y.o. male with a hx of hypertension, hyperlipidemia, OSA on CPAP, former smoker x10 years who presents due to fatigue.  Patient's father recently had an MI in his 82s.  Patient was concerned regarding this and wants to get his heart checked out.  He denies chest pain or shortness of breath at rest or with exertion.  Denies palpitations, edema, dizziness.  Able to walk without any symptoms.  He has stairs in his office and at home and is able to exert himself without any symptoms.  He used to work for long hours in his younger years, but has noticed fatigue when he tries to work that long of late.  Takes his medications for blood pressure and cholesterol as prescribed.  Past Medical History:  Diagnosis Date  . Hyperlipidemia   . Hypertension     History reviewed. No pertinent surgical history.  Current Medications: Current Meds  Medication Sig  . atorvastatin (LIPITOR) 40 MG tablet Take 1 tablet (40 mg total) by mouth daily.  . diazepam (VALIUM) 10 MG tablet One tablet one hour before oral surgery  . fluticasone (FLONASE) 50 MCG/ACT nasal spray USE 2 SPRAYS EACH NOSTRIL DAILY  . hydrochlorothiazide (HYDRODIURIL) 25 MG tablet Take 1 tablet  (25 mg total) by mouth daily.  Marland Kitchen ketoconazole (NIZORAL) 2 % cream Apply 1 application topically daily. Use thin layer on affected rash areas. Do before bedtime.  . Loratadine (ALAVERT PO) Take by mouth. 1 dissolvable tablet as needed daily for allergies.  Marland Kitchen nystatin (MYCOSTATIN/NYSTOP) powder Apply topically every morning.  . sildenafil (REVATIO) 20 MG tablet Take 1 tablet (20 mg total) by mouth 3 (three) times daily.  Marland Kitchen telmisartan (MICARDIS) 40 MG tablet Take 1 tablet (40 mg total) by mouth at bedtime.  . triamcinolone cream (KENALOG) 0.1 % Apply 1 application topically 2 (two) times daily. Until the rash resolves  . zolpidem (AMBIEN) 10 MG tablet Take 1 tablet (10 mg total) by mouth at bedtime as needed. for sleep     Allergies:   Patient has no known allergies.   Social History   Socioeconomic History  . Marital status: Married    Spouse name: Not on file  . Number of children: Not on file  . Years of education: Not on file  . Highest education level: Not on file  Occupational History    Employer: [employer]    Comment: travels,    Tobacco Use  . Smoking status: Former Smoker    Years: 10.00    Types: Cigarettes    Quit date: 02/13/1993    Years since quitting:  26.7  . Smokeless tobacco: Never Used  Vaping Use  . Vaping Use: Never used  Substance and Sexual Activity  . Alcohol use: Yes    Alcohol/week: 7.0 standard drinks    Types: 7 Glasses of wine per week  . Drug use: No  . Sexual activity: Not on file  Other Topics Concern  . Not on file  Social History Narrative  . Not on file   Social Determinants of Health   Financial Resource Strain:   . Difficulty of Paying Living Expenses: Not on file  Food Insecurity:   . Worried About Charity fundraiser in the Last Year: Not on file  . Ran Out of Food in the Last Year: Not on file  Transportation Needs:   . Lack of Transportation (Medical): Not on file  . Lack of Transportation (Non-Medical): Not on file    Physical Activity:   . Days of Exercise per Week: Not on file  . Minutes of Exercise per Session: Not on file  Stress:   . Feeling of Stress : Not on file  Social Connections:   . Frequency of Communication with Friends and Family: Not on file  . Frequency of Social Gatherings with Friends and Family: Not on file  . Attends Religious Services: Not on file  . Active Member of Clubs or Organizations: Not on file  . Attends Archivist Meetings: Not on file  . Marital Status: Not on file     Family History: The patient's family history includes CAD in his father; Cancer in his maternal aunt; [name]; Heart attack (age of onset: 106) in his maternal grandfather; [name].  ROS:   Please see the history of present illness.     All other systems reviewed and are negative.  EKGs/Labs/Other Studies Reviewed:    The following studies were reviewed today:   EKG:  EKG is  ordered today.  The ekg ordered today demonstrates normal sinus rhythm, right bundle branch block  Recent Labs: 11/12/2019: ALT 46; BUN 15; Creatinine, Ser 0.99; Hemoglobin 15.1; Platelets 182.0; Potassium 4.0; Sodium 140; TSH 1.48  Recent Lipid Panel    Component Value Date/Time   CHOL 182 11/12/2019 1020   TRIG 139.0 11/12/2019 1020   HDL 57.90 11/12/2019 1020   CHOLHDL 3 11/12/2019 1020   VLDL 27.8 11/12/2019 1020   LDLCALC 96 11/12/2019 1020   LDLDIRECT 105.0 07/24/2018 1542    Physical Exam:    VS:  BP 130/80 (BP Location: Right Arm, Patient Position: Sitting, Cuff Size: Normal)   Pulse 65   Ht 6\' 1"  (1.854 m)   Wt 244 lb 6 oz (110.8 kg)   SpO2 98%   BMI 32.24 kg/m     Wt Readings from Last 3 Encounters:  11/14/19 244 lb 6 oz (110.8 kg)  11/12/19 243 lb 3.2 oz (110.3 kg)  01/08/19 231 lb (104.8 kg)     GEN:  Well nourished, well developed in no acute distress HEENT: Normal NECK: No JVD; No carotid bruits LYMPHATICS: No lymphadenopathy CARDIAC: RRR,  no murmurs, rubs, gallops RESPIRATORY:  Clear to auscultation without rales, wheezing or rhonchi  ABDOMEN: Soft, non-tender, non-distended MUSCULOSKELETAL:  No edema; No deformity  SKIN: Warm and dry NEUROLOGIC:  Alert and oriented x 3 PSYCHIATRIC:  Normal affect   ASSESSMENT:    1. Cardiac risk counseling   2. Primary hypertension   3. Pure hypercholesterolemia    PLAN:    In  order of problems listed above:  1. Patient worried about cardiac risk due to father recently having MI in his 62s.  He currently has no cardiac symptoms such as chest pain, shortness of breath.  Has risk factors of hypertension, hyperlipidemia.  Patient educated on additional risk prognosticator of calcium scoring.  This is however not covered by insurance and paid out of pocket.  He understands and is willing to obtain scan.  We will schedule patient for calcium scan.  If greater than 100, will recommend aspirin 81 mg. 2. History of hypertension, BP well controlled.  Continue current BP meds. 3. hx of hyperlipidemia, continue Lipitor.   Follow-up after calcium score.   Medication Adjustments/Labs and Tests Ordered: Current medicines are reviewed at length with the patient today.  Concerns regarding medicines are outlined above.  Orders Placed This Encounter  Procedures  . CT CARDIAC SCORING  . EKG 12-Lead   No orders of the defined types were placed in this encounter.   Patient Instructions  Medication Instructions:  Your physician recommends that you continue on your current medications as directed. Please refer to the Current Medication list given to you today. *If you need a refill on your cardiac medications before your next appointment, please call your pharmacy*   Lab Work: None Ordered If you have labs (blood work) drawn today and your tests are completely normal, you will receive your results only by: Marland Kitchen MyChart Message (if you have MyChart) OR . A paper copy in the mail If you have any  lab test that is abnormal or we need to change your treatment, we will call you to review the results.   Testing/Procedures: We will order CT coronary calcium score  $150 at our [facility] in [place]     Please call [phone] to schedule  [facility]  [place], [place] [address]   Follow-Up: At [facility], you and your health needs are our priority.  As part of our continuing mission to provide you with exceptional heart care, we have created designated Provider Care Teams.  These Care Teams include your primary Cardiologist (physician) and Advanced Practice Providers (APPs -  Physician Assistants and Nurse Practitioners) who all work together to provide you with the care you need, when you need it.  We recommend signing up for the patient portal called "MyChart".  Sign up information is provided on this After Visit Summary.  MyChart is used to connect with patients for Virtual Visits (Telemedicine).  Patients are able to view lab/test results, encounter notes, upcoming appointments, etc.  Non-urgent messages can be sent to your provider as well.   To learn more about what you can do with MyChart, go to [email or web address].    Your next appointment:   Week of Oct 11th  The format for your next appointment:   In Person  Provider:   Kate Sable, MD   Other Instructions     Signed, Kate Sable, MD  11/14/2019 2:47 PM    [place]

## 2019-11-14 NOTE — Patient Instructions (Addendum)
Medication Instructions:  Your physician recommends that you continue on your current medications as directed. Please refer to the Current Medication list given to you today. *If you need a refill on your cardiac medications before your next appointment, please call your pharmacy*   Lab Work: None Ordered If you have labs (blood work) drawn today and your tests are completely normal, you will receive your results only by: Marland Kitchen MyChart Message (if you have MyChart) OR . A paper copy in the mail If you have any lab test that is abnormal or we need to change your treatment, we will call you to review the results.   Testing/Procedures: We will order CT coronary calcium score  $150 at our [facility] in [place]     Please call [phone] to schedule  [facility]  [place], [place] [address]   Follow-Up: At [facility], you and your health needs are our priority.  As part of our continuing mission to provide you with exceptional heart care, we have created designated Provider Care Teams.  These Care Teams include your primary Cardiologist (physician) and Advanced Practice Providers (APPs -  Physician Assistants and Nurse Practitioners) who all work together to provide you with the care you need, when you need it.  We recommend signing up for the patient portal called "MyChart".  Sign up information is provided on this After Visit Summary.  MyChart is used to connect with patients for Virtual Visits (Telemedicine).  Patients are able to view lab/test results, encounter notes, upcoming appointments, etc.  Non-urgent messages can be sent to your provider as well.   To learn more about what you can do with MyChart, go to [email or web address].    Your next appointment:   Week of Oct 11th  The format for your next appointment:   In Person  Provider:   Kate Sable, MD   Other Instructions

## 2019-11-15 DIAGNOSIS — K760 Fatty (change of) liver, not elsewhere classified: Secondary | ICD-10-CM | POA: Insufficient documentation

## 2019-11-15 DIAGNOSIS — R7401 Elevation of levels of liver transaminase levels: Secondary | ICD-10-CM | POA: Insufficient documentation

## 2019-11-15 NOTE — Addendum Note (Signed)
Addended by: Crecencio Mc on: 11/15/2019 04:03 PM   Modules accepted: Orders

## 2019-11-15 NOTE — Progress Notes (Signed)
Labs are normal except for a mildly elevated liver enzyme.  Needs to repeat this test in 3 weeks to make sure it was transient.   No fasting required

## 2019-11-18 ENCOUNTER — Telehealth: Payer: Self-pay

## 2019-11-18 NOTE — Telephone Encounter (Signed)
LMTCB in regards to lab results. Need to schedule pt for a repeat lab appt in 3 weeks.

## 2019-12-01 ENCOUNTER — Telehealth (INDEPENDENT_AMBULATORY_CARE_PROVIDER_SITE_OTHER): Payer: Self-pay | Admitting: Gastroenterology

## 2019-12-01 DIAGNOSIS — Z86010 Personal history of colonic polyps: Secondary | ICD-10-CM

## 2019-12-01 DIAGNOSIS — M7020 Olecranon bursitis, unspecified elbow: Secondary | ICD-10-CM | POA: Insufficient documentation

## 2019-12-01 MED ORDER — NA SULFATE-K SULFATE-MG SULF 17.5-3.13-1.6 GM/177ML PO SOLN: 1.0000 | Freq: Once | ORAL | 0 refills | Status: AC

## 2019-12-01 NOTE — Progress Notes (Signed)
Gastroenterology Pre-Procedure Review  Request Date: Thursday 12/25/19 Requesting Physician: Dr. Vicente Males  PATIENT REVIEW QUESTIONS: The patient responded to the following health history questions as indicated:    1. Are you having any GI issues? no 2. Do you have a personal history of Polyps? yes (Dr. Jamal Collin noted polyps on 03/19/14 colonoscopy report.) 3. Do you have a family history of Colon Cancer or Polyps? Dad colon polyps 4. Diabetes Mellitus? no 5. Joint replacements in the past 12 months?no 6. Major health problems in the past 3 months?no 7. Any artificial heart valves, MVP, or defibrillator?no    MEDICATIONS & ALLERGIES:    Patient reports the following regarding taking any anticoagulation/antiplatelet therapy:   Plavix, Coumadin, Eliquis, Xarelto, Lovenox, Pradaxa, Brilinta, or Effient? no Aspirin? no  Patient confirms/reports the following medications:  Current Outpatient Medications  Medication Sig Dispense Refill  . atorvastatin (LIPITOR) 40 MG tablet Take 1 tablet (40 mg total) by mouth daily. 90 tablet 0  . hydrochlorothiazide (HYDRODIURIL) 25 MG tablet Take 1 tablet (25 mg total) by mouth daily. 90 tablet 3  . sildenafil (REVATIO) 20 MG tablet Take 1 tablet (20 mg total) by mouth 3 (three) times daily. 30 tablet 11  . telmisartan (MICARDIS) 40 MG tablet Take 1 tablet (40 mg total) by mouth at bedtime. 90 tablet 1  . triamcinolone cream (KENALOG) 0.1 % Apply 1 application topically 2 (two) times daily. Until the rash resolves 80 g 0  . zolpidem (AMBIEN) 10 MG tablet Take 1 tablet (10 mg total) by mouth at bedtime as needed. for sleep 30 tablet 5  . diazepam (VALIUM) 10 MG tablet One tablet one hour before oral surgery (Patient not taking: Reported on 12/01/2019) 10 tablet 0  . fluticasone (FLONASE) 50 MCG/ACT nasal spray USE 2 SPRAYS EACH NOSTRIL DAILY (Patient not taking: Reported on 12/01/2019) 16 g 1  . ketoconazole (NIZORAL) 2 % cream Apply 1 application topically  daily. Use thin layer on affected rash areas. Do before bedtime. (Patient not taking: Reported on 12/01/2019) 60 g 0  . Loratadine (ALAVERT PO) Take by mouth. 1 dissolvable tablet as needed daily for allergies. (Patient not taking: Reported on 12/01/2019)    . Na Sulfate-K Sulfate-Mg Sulf 17.5-3.13-1.6 GM/177ML SOLN Take 1 kit by mouth once for 1 dose. 354 mL 0  . nystatin (MYCOSTATIN/NYSTOP) powder Apply topically every morning. (Patient not taking: Reported on 12/01/2019) 60 g 0  . promethazine (PHENERGAN) 12.5 MG tablet Take 1 tablet (12.5 mg total) by mouth every 8 (eight) hours as needed for nausea or vomiting. (Patient not taking: Reported on 12/01/2019) 20 tablet 0   No current facility-administered medications for this visit.    Patient confirms/reports the following allergies:  No Known Allergies  No orders of the defined types were placed in this encounter.   AUTHORIZATION INFORMATION Primary Insurance: 1D#: Group #:  Secondary Insurance: 1D#: Group #:  SCHEDULE INFORMATION: Date: Thursday 12/25/19 Time: Location:[facility]

## 2019-12-03 ENCOUNTER — Other Ambulatory Visit: Payer: Self-pay | Admitting: Internal Medicine

## 2019-12-04 ENCOUNTER — Other Ambulatory Visit: Payer: Self-pay

## 2019-12-04 DIAGNOSIS — Z86010 Personal history of colonic polyps: Secondary | ICD-10-CM

## 2019-12-09 ENCOUNTER — Ambulatory Visit: Payer: BC Managed Care – PPO | Admitting: Cardiology

## 2019-12-10 ENCOUNTER — Other Ambulatory Visit: Payer: Self-pay

## 2019-12-10 ENCOUNTER — Other Ambulatory Visit (INDEPENDENT_AMBULATORY_CARE_PROVIDER_SITE_OTHER): Payer: BC Managed Care – PPO

## 2019-12-10 ENCOUNTER — Other Ambulatory Visit: Payer: Self-pay | Admitting: Internal Medicine

## 2019-12-10 DIAGNOSIS — R7401 Elevation of levels of liver transaminase levels: Secondary | ICD-10-CM

## 2019-12-10 LAB — HEPATIC FUNCTION PANEL
ALT: 38 U/L (ref 0–53)
AST: 44 U/L — ABNORMAL HIGH (ref 0–37)
Albumin: 4.3 g/dL (ref 3.5–5.2)
Alkaline Phosphatase: 69 U/L (ref 39–117)
Bilirubin, Direct: 0.1 mg/dL (ref 0.0–0.3)
Total Bilirubin: 0.9 mg/dL (ref 0.2–1.2)
Total Protein: 6.8 g/dL (ref 6.0–8.3)

## 2019-12-10 NOTE — Progress Notes (Signed)
  Your liver enzyme remains mildly elevated for unclear reasons.   I recommend that you return  for additional blood tests to rule out various infections and autoimmune disorders that can cause elevated liver enzymes (hepatitis, hemochromatosis, etc) .  Most of the times these tests turn out to be normal and the cause is a condition called fatty liver.  If the labs are normal,  I will recommend that we follow up with an ultrasound of the liver to examine its size and general appearance and make sure there are no masses  Or bile duct blockage.  Please call the office to schedule the labs and a follow up appointment to discuss the results.   Regards,    Deborra Medina, MD

## 2019-12-15 ENCOUNTER — Other Ambulatory Visit: Payer: Self-pay

## 2019-12-15 ENCOUNTER — Other Ambulatory Visit (INDEPENDENT_AMBULATORY_CARE_PROVIDER_SITE_OTHER): Payer: BC Managed Care – PPO

## 2019-12-15 ENCOUNTER — Ambulatory Visit: Payer: BC Managed Care – PPO | Admitting: Cardiology

## 2019-12-15 DIAGNOSIS — R7401 Elevation of levels of liver transaminase levels: Secondary | ICD-10-CM | POA: Diagnosis not present

## 2019-12-15 LAB — HEPATIC FUNCTION PANEL
ALT: 42 U/L (ref 0–53)
AST: 42 U/L — ABNORMAL HIGH (ref 0–37)
Albumin: 4.2 g/dL (ref 3.5–5.2)
Alkaline Phosphatase: 72 U/L (ref 39–117)
Bilirubin, Direct: 0.1 mg/dL (ref 0.0–0.3)
Total Bilirubin: 0.8 mg/dL (ref 0.2–1.2)
Total Protein: 6.7 g/dL (ref 6.0–8.3)

## 2019-12-15 LAB — IBC + FERRITIN
Ferritin: 90.2 ng/mL (ref 22.0–322.0)
Iron: 102 ug/dL (ref 42–165)
Saturation Ratios: 28.1 % (ref 20.0–50.0)
Transferrin: 259 mg/dL (ref 212.0–360.0)

## 2019-12-16 ENCOUNTER — Telehealth: Payer: Self-pay

## 2019-12-16 NOTE — Telephone Encounter (Signed)
Returned patients call. Pt had questions regarding covid testing and prep instructions. Informed patient instructions were sent via my chart and mail. Instructions were located on my chart. Pt verbalized understanding.

## 2019-12-17 ENCOUNTER — Other Ambulatory Visit: Payer: Self-pay

## 2019-12-17 ENCOUNTER — Encounter: Payer: Self-pay | Admitting: Internal Medicine

## 2019-12-17 ENCOUNTER — Ambulatory Visit (INDEPENDENT_AMBULATORY_CARE_PROVIDER_SITE_OTHER): Payer: BC Managed Care – PPO | Admitting: Internal Medicine

## 2019-12-17 DIAGNOSIS — R7401 Elevation of levels of liver transaminase levels: Secondary | ICD-10-CM | POA: Diagnosis not present

## 2019-12-17 LAB — PROTEIN ELECTROPHORESIS, SERUM
Albumin ELP: 4 g/dL (ref 3.8–4.8)
Alpha 1: 0.3 g/dL (ref 0.2–0.3)
Alpha 2: 0.6 g/dL (ref 0.5–0.9)
Beta 2: 0.4 g/dL (ref 0.2–0.5)
Beta Globulin: 0.4 g/dL (ref 0.4–0.6)
Gamma Globulin: 0.8 g/dL (ref 0.8–1.7)
Total Protein: 6.4 g/dL (ref 6.1–8.1)

## 2019-12-17 LAB — HEPATITIS C ANTIBODY
Hepatitis C Ab: NONREACTIVE
SIGNAL TO CUT-OFF: 0.01 (ref <1.00)

## 2019-12-17 LAB — MITOCHONDRIAL ANTIBODIES: Mitochondrial M2 Ab, IgG: <=20 U

## 2019-12-17 LAB — ANTI-SMITH ANTIBODY: ENA SM Ab Ser-aCnc: <1 AI

## 2019-12-17 LAB — ANA: Anti Nuclear Antibody (ANA): NEGATIVE

## 2019-12-17 LAB — HEPATITIS B CORE ANTIBODY, TOTAL: Hep B Core Total Ab: NONREACTIVE

## 2019-12-17 LAB — HEPATITIS B SURFACE ANTIGEN: Hepatitis B Surface Ag: NONREACTIVE

## 2019-12-17 NOTE — Assessment & Plan Note (Addendum)
Repeat AST is elevated.  Screening labs for autoimmune , viral and iron overload syndromes are negative.  RUQ [place] ordered.  Alcohol intake reviewed.; he has already reduce wine consumption to 1-2 glasses daily

## 2019-12-17 NOTE — Progress Notes (Signed)
Subjective:  Patient ID: Walter Osborne, male    DOB: May 24, 1955  Age: 65 y.o. MRN: [id number]  CC: The encounter diagnosis was Elevated AST (SGOT).  HPI Walter Osborne presents for follow up on elevated AST  This visit occurred during the SARS-CoV-2 public health emergency.  Safety protocols were in place, including screening questions prior to the visit, additional usage of staff PPE, and extensive cleaning of exam room while observing appropriate contact time as indicated for disinfecting solutions.    Patient has received both doses of the available COVID 19 vaccine without complications.  Patient continues to mask when outside of the home except when walking in yard or at safe distances from others .  Patient denies any change in mood or development of unhealthy behaviors resuting from the pandemic's restriction of activities and socialization.    64 yr old male with obesity, hyperlipidemia, persistent mild AST elevation returns for follow up on additional serologies done to screen for viral,  autoimmune and iron overload syndromes.  Labs reviewed with patient and all normal   Alcohol intake reviewed:  Drinks 3-4 glasses of wine daily,  Has reduced to 1 -2 glasses since last visit.     Outpatient Medications Prior to Visit  Medication Sig Dispense Refill  . atorvastatin (LIPITOR) 40 MG tablet Take 1 tablet by mouth once daily 90 tablet 0  . diazepam (VALIUM) 10 MG tablet One tablet one hour before oral surgery (Patient not taking: Reported on 12/01/2019) 10 tablet 0  . fluticasone (FLONASE) 50 MCG/ACT nasal spray USE 2 SPRAYS EACH NOSTRIL DAILY (Patient not taking: Reported on 12/01/2019) 16 g 1  . hydrochlorothiazide (HYDRODIURIL) 25 MG tablet Take 1 tablet (25 mg total) by mouth daily. 90 tablet 3  . ketoconazole (NIZORAL) 2 % cream Apply 1 application topically daily. Use thin layer on affected rash areas. Do before bedtime. (Patient not taking: Reported on 12/01/2019)  60 g 0  . Loratadine (ALAVERT PO) Take by mouth. 1 dissolvable tablet as needed daily for allergies. (Patient not taking: Reported on 12/01/2019)    . nystatin (MYCOSTATIN/NYSTOP) powder Apply topically every morning. (Patient not taking: Reported on 12/01/2019) 60 g 0  . promethazine (PHENERGAN) 12.5 MG tablet Take 1 tablet (12.5 mg total) by mouth every 8 (eight) hours as needed for nausea or vomiting. (Patient not taking: Reported on 12/01/2019) 20 tablet 0  . sildenafil (REVATIO) 20 MG tablet Take 1 tablet (20 mg total) by mouth 3 (three) times daily. 30 tablet 11  . telmisartan (MICARDIS) 40 MG tablet Take 1 tablet (40 mg total) by mouth at bedtime. 90 tablet 1  . triamcinolone cream (KENALOG) 0.1 % Apply 1 application topically 2 (two) times daily. Until the rash resolves 80 g 0  . zolpidem (AMBIEN) 10 MG tablet Take 1 tablet (10 mg total) by mouth at bedtime as needed. for sleep 30 tablet 5   No facility-administered medications prior to visit.    Review of Systems;  Patient denies headache, fevers, malaise, unintentional weight loss, skin rash, eye pain, sinus congestion and sinus pain, sore throat, dysphagia,  hemoptysis , cough, dyspnea, wheezing, chest pain, palpitations, orthopnea, edema, abdominal pain, nausea, melena, diarrhea, constipation, flank pain, dysuria, hematuria, urinary  Frequency, nocturia, numbness, tingling, seizures,  Focal weakness, Loss of consciousness,  Tremor, insomnia, depression, anxiety, and suicidal ideation.      Objective:  BP 128/70   Pulse (!) 58   Temp 98.1 F (36.7 C) (Oral)   Resp  16   Wt 243 lb (110.2 kg)   SpO2 98%   BMI 32.06 kg/m   BP Readings from Last 3 Encounters:  12/17/19 128/70  11/14/19 130/80  11/12/19 130/88    Wt Readings from Last 3 Encounters:  12/17/19 243 lb (110.2 kg)  11/14/19 244 lb 6 oz (110.8 kg)  11/12/19 243 lb 3.2 oz (110.3 kg)    General appearance: alert, cooperative and appears stated age Ears: normal  TM's and external ear canals both ears Throat: lips, mucosa, and tongue normal; teeth and gums normal Neck: no adenopathy, no carotid bruit, supple, symmetrical, trachea midline and thyroid not enlarged, symmetric, no tenderness/mass/nodules Back: symmetric, no curvature. ROM normal. No CVA tenderness. Lungs: clear to auscultation bilaterally Heart: regular rate and rhythm, S1, S2 normal, no murmur, click, rub or gallop Abdomen: soft, non-tender; bowel sounds normal; no masses,  no organomegaly Pulses: 2+ and symmetric Skin: Skin color, texture, turgor normal. No rashes or lesions Lymph nodes: Cervical, supraclavicular, and axillary nodes normal.  Lab Results  Component Value Date   HGBA1C 5.7 11/12/2019    Lab Results  Component Value Date   CREATININE 0.99 11/12/2019   CREATININE 1.19 07/24/2018   CREATININE 1.15 12/06/2017    Lab Results  Component Value Date   WBC 5.7 11/12/2019   HGB 15.1 11/12/2019   HCT 43.2 11/12/2019   PLT 182.0 11/12/2019   GLUCOSE 93 11/12/2019   CHOL 182 11/12/2019   TRIG 139.0 11/12/2019   HDL 57.90 11/12/2019   LDLDIRECT 105.0 07/24/2018   LDLCALC 96 11/12/2019   ALT 42 12/15/2019   AST 42 (H) 12/15/2019   NA 140 11/12/2019   K 4.0 11/12/2019   CL 104 11/12/2019   CREATININE 0.99 11/12/2019   BUN 15 11/12/2019   CO2 28 11/12/2019   TSH 1.48 11/12/2019   PSA 2.34 11/12/2019   HGBA1C 5.7 11/12/2019   MICROALBUR <0.7 11/29/2016    No results found.  Assessment & Plan:   Problem List Items Addressed This Visit      Unprioritized   Elevated AST (SGOT)    Repeat AST is elevated.  Screening labs for autoimmune , viral and iron overload syndromes are negative.  RUQ [place] ordered.  Alcohol intake reviewed.; he has already reduce wine consumption to 1-2 glasses daily       Relevant Orders   US Abdomen Limited RUQ (LIVER/GB)      I provided  30 minutes of  face-to-face time during this encounter reviewing patient's current problems and  past surgeries, labs and imaging studies, providing counseling on the potential diagnosis and management of of fatty liver, and coordination  of care .  I am having Walter Osborne. Walter "MIKE" maintain his Loratadine (ALAVERT PO), fluticasone, promethazine, sildenafil, ketoconazole, nystatin, triamcinolone cream, diazepam, telmisartan, zolpidem, hydrochlorothiazide, and atorvastatin.  No orders of the defined types were placed in this encounter.   There are no discontinued medications.  Follow-up: No follow-ups on file.   Crecencio Mc, MD

## 2019-12-22 ENCOUNTER — Ambulatory Visit
Admission: RE | Admit: 2019-12-22 | Discharge: 2019-12-22 | Disposition: A | Discharge status: Home / Self Care | Payer: BC Managed Care – PPO | Source: Ambulatory Visit | Attending: Cardiology | Admitting: Cardiology

## 2019-12-22 ENCOUNTER — Other Ambulatory Visit: Payer: Self-pay

## 2019-12-22 DIAGNOSIS — E7800 Pure hypercholesterolemia, unspecified: Secondary | ICD-10-CM | POA: Insufficient documentation

## 2019-12-23 ENCOUNTER — Telehealth: Payer: Self-pay

## 2019-12-23 ENCOUNTER — Other Ambulatory Visit
Admission: RE | Admit: 2019-12-23 | Discharge: 2019-12-23 | Disposition: A | Discharge status: Home / Self Care | Payer: BC Managed Care – PPO | Source: Ambulatory Visit | Attending: Gastroenterology | Admitting: Gastroenterology

## 2019-12-23 DIAGNOSIS — Z01812 Encounter for preprocedural laboratory examination: Secondary | ICD-10-CM | POA: Diagnosis not present

## 2019-12-23 DIAGNOSIS — Z20822 Contact with and (suspected) exposure to covid-19: Secondary | ICD-10-CM | POA: Diagnosis not present

## 2019-12-23 LAB — SARS CORONAVIRUS 2 (TAT 6-24 HRS): SARS Coronavirus 2: NEGATIVE

## 2019-12-23 MED ORDER — ATORVASTATIN CALCIUM 80 MG PO TABS: 80.0000 mg | ORAL_TABLET | Freq: Every day | ORAL | 3 refills | Status: DC

## 2019-12-23 NOTE — Telephone Encounter (Signed)
Spoke to patient and gave him the following result note from Dr. Garen Lah:  High calcium score of 649.  Continue aspirin as prescribed.  Please increase Lipitor to 80 mg daily.  Goal LDL is less than 70.  Keep follow-up appointment.  Patient verbalized understanding and agreed with plan.

## 2019-12-25 ENCOUNTER — Ambulatory Visit: Payer: BC Managed Care – PPO | Admitting: Certified Registered"

## 2019-12-25 ENCOUNTER — Other Ambulatory Visit: Payer: Self-pay

## 2019-12-25 ENCOUNTER — Encounter
Admission: RE | Disposition: A | Discharge status: Home / Self Care | Payer: Self-pay | Source: Home / Self Care | Attending: Gastroenterology

## 2019-12-25 ENCOUNTER — Ambulatory Visit
Admission: RE | Admit: 2019-12-25 | Discharge: 2019-12-25 | Disposition: A | Discharge status: Home / Self Care | Payer: BC Managed Care – PPO | Attending: Gastroenterology | Admitting: Gastroenterology

## 2019-12-25 ENCOUNTER — Encounter: Payer: Self-pay | Admitting: Gastroenterology

## 2019-12-25 DIAGNOSIS — D122 Benign neoplasm of ascending colon: Secondary | ICD-10-CM | POA: Diagnosis not present

## 2019-12-25 DIAGNOSIS — D123 Benign neoplasm of transverse colon: Secondary | ICD-10-CM | POA: Diagnosis not present

## 2019-12-25 DIAGNOSIS — Z1211 Encounter for screening for malignant neoplasm of colon: Secondary | ICD-10-CM | POA: Insufficient documentation

## 2019-12-25 DIAGNOSIS — Z79899 Other long term (current) drug therapy: Secondary | ICD-10-CM | POA: Diagnosis not present

## 2019-12-25 DIAGNOSIS — E785 Hyperlipidemia, unspecified: Secondary | ICD-10-CM | POA: Insufficient documentation

## 2019-12-25 DIAGNOSIS — Z8349 Family history of other endocrine, nutritional and metabolic diseases: Secondary | ICD-10-CM | POA: Insufficient documentation

## 2019-12-25 DIAGNOSIS — I10 Essential (primary) hypertension: Secondary | ICD-10-CM | POA: Insufficient documentation

## 2019-12-25 DIAGNOSIS — D120 Benign neoplasm of cecum: Secondary | ICD-10-CM | POA: Insufficient documentation

## 2019-12-25 DIAGNOSIS — K635 Polyp of colon: Secondary | ICD-10-CM | POA: Diagnosis not present

## 2019-12-25 DIAGNOSIS — Z801 Family history of malignant neoplasm of trachea, bronchus and lung: Secondary | ICD-10-CM | POA: Insufficient documentation

## 2019-12-25 DIAGNOSIS — Z8249 Family history of ischemic heart disease and other diseases of the circulatory system: Secondary | ICD-10-CM | POA: Diagnosis not present

## 2019-12-25 DIAGNOSIS — Z86010 Personal history of colonic polyps: Secondary | ICD-10-CM | POA: Diagnosis not present

## 2019-12-25 DIAGNOSIS — Z833 Family history of diabetes mellitus: Secondary | ICD-10-CM | POA: Diagnosis not present

## 2019-12-25 DIAGNOSIS — D126 Benign neoplasm of colon, unspecified: Secondary | ICD-10-CM | POA: Diagnosis not present

## 2019-12-25 DIAGNOSIS — Z87891 Personal history of nicotine dependence: Secondary | ICD-10-CM | POA: Diagnosis not present

## 2019-12-25 DIAGNOSIS — K5790 Diverticulosis of intestine, part unspecified, without perforation or abscess without bleeding: Secondary | ICD-10-CM | POA: Diagnosis not present

## 2019-12-25 HISTORY — PX: COLONOSCOPY WITH PROPOFOL: SHX5780

## 2019-12-25 HISTORY — DX: Sleep apnea, unspecified: G47.30

## 2019-12-25 SURGERY — COLONOSCOPY WITH PROPOFOL
Anesthesia: General

## 2019-12-25 MED ORDER — LIDOCAINE HCL (CARDIAC) PF 100 MG/5ML IV SOSY
PREFILLED_SYRINGE | INTRAVENOUS | Status: DC | PRN
  Administered 2019-12-25: 100 mg via INTRAVENOUS

## 2019-12-25 MED ORDER — SODIUM CHLORIDE 0.9 % IV SOLN: INTRAVENOUS | Status: DC

## 2019-12-25 MED ORDER — PROPOFOL 10 MG/ML IV BOLUS
INTRAVENOUS | Status: DC | PRN
  Administered 2019-12-25: 60 mg via INTRAVENOUS

## 2019-12-25 MED ORDER — PROPOFOL 500 MG/50ML IV EMUL
INTRAVENOUS | Status: DC | PRN
  Administered 2019-12-25: 165 ug/kg/min via INTRAVENOUS

## 2019-12-25 MED ORDER — GLYCOPYRROLATE 0.2 MG/ML IJ SOLN
INTRAMUSCULAR | Status: DC | PRN
  Administered 2019-12-25: .2 mg via INTRAVENOUS

## 2019-12-25 NOTE — Transfer of Care (Signed)
Immediate Anesthesia Transfer of Care Note  Patient: Mico Spark Restivo  Procedure(s) Performed: COLONOSCOPY WITH PROPOFOL (N/A )  Patient Location: Endoscopy Unit  Anesthesia Type:General  Level of Consciousness: awake, drowsy and patient cooperative  Airway & Oxygen Therapy: Patient Spontanous Breathing and Patient connected to face mask oxygen  Post-op Assessment: Report given to RN and Post -op Vital signs reviewed and stable  Post vital signs: Reviewed and stable  Last Vitals:  Vitals Value Taken Time  BP 133/79 12/25/19 1017  Temp    Pulse 75 12/25/19 1019  Resp 17 12/25/19 1019  SpO2 97 % 12/25/19 1019  Vitals shown include unvalidated device data.  Last Pain:  Vitals:   12/25/19 1017  TempSrc:   PainSc: 0-No pain         Complications: No complications documented.

## 2019-12-25 NOTE — Op Note (Signed)
[facility] Gastroenterology Patient Name: Jaja Switalski Procedure Date: 12/25/2019 9:36 AM MRN: [id number] Account #: [id number] Date of Birth: 1955-04-04 Admit Type: Outpatient Age: 64 Room: [facility] ENDO ROOM 3 Gender: Male Note Status: Finalized Procedure:             Colonoscopy Indications:           High risk colon cancer surveillance: Personal history                         of colonic polyps, Last colonoscopy: February 2016 Providers:             Jonathon Bellows MD, MD Referring MD:          Deborra Medina, MD (Referring MD) Medicines:             Monitored Anesthesia Care Complications:         No immediate complications. Procedure:             Pre-Anesthesia Assessment:                        - Prior to the procedure, a History and Physical was                         performed, and patient medications, allergies and                         sensitivities were reviewed. The patient's tolerance                         of previous anesthesia was reviewed.                        - The risks and benefits of the procedure and the                         sedation options and risks were discussed with the                         patient. All questions were answered and informed                         consent was obtained.                        - ASA Grade Assessment: II - A patient with mild                         systemic disease.                        After obtaining informed consent, the colonoscope was                         passed under direct vision. Throughout the procedure,                         the patient's blood pressure, pulse, and oxygen                         saturations were monitored  continuously. The                         Colonoscope was introduced through the anus and                         advanced to the the cecum, identified by the                         appendiceal orifice. The colonoscopy was performed                         with  ease. The patient tolerated the procedure well.                         The quality of the bowel preparation was excellent. Findings:      The perianal and digital rectal examinations were normal.      Two sessile polyps were found in the cecum. The polyps were 4 to 6 mm in       size. These polyps were removed with a cold snare. Resection and       retrieval were complete.      Two pedunculated polyps were found in the transverse colon and ascending       colon. The polyps were 5 to 7 mm in size. These polyps were removed with       a hot snare. Resection and retrieval were complete. To prevent bleeding       after the polypectomy, one hemostatic clip was successfully placed.       There was no bleeding during, or at the end, of the procedure.      Two sessile polyps were found in the ascending colon. The polyps were 4       to 6 mm in size. These polyps were removed with a cold snare. Resection       and retrieval were complete.      The exam was otherwise without abnormality on direct and retroflexion       views.      Multiple small-mouthed diverticula were found in the sigmoid colon. Impression:            - Two 4 to 6 mm polyps in the cecum, removed with a                         cold snare. Resected and retrieved.                        - Two 5 to 7 mm polyps in the transverse colon and in                         the ascending colon, removed with a hot snare.                         Resected and retrieved. Clip was placed.                        - Two 4 to 6 mm polyps in the ascending colon, removed  with a cold snare. Resected and retrieved.                        - The examination was otherwise normal on direct and                         retroflexion views. Recommendation:        - Discharge patient to home (with escort).                        - Resume previous diet.                        - Continue present medications.                        - Await  pathology results.                        - Repeat colonoscopy in 3 years for surveillance based                         on pathology results. Procedure Code(s):     --- Professional ---                        [phone], Colonoscopy, flexible; with removal of                         tumor(s), polyp(s), or other lesion(s) by snare                         technique Diagnosis Code(s):     --- Professional ---                        Z86.010, Personal history of colonic polyps                        K63.5, Polyp of colon CPT copyright 2019 [employer]. All rights reserved. The codes documented in this report are preliminary and upon coder review may  be revised to meet current compliance requirements. Jonathon Bellows, MD Jonathon Bellows MD, MD 12/25/2019 10:16:59 AM This report has been signed electronically. Number of Addenda: 0 Note Initiated On: 12/25/2019 9:36 AM Scope Withdrawal Time: 0 hours 20 minutes 1 second  Total Procedure Duration: 0 hours 26 minutes 21 seconds  Estimated Blood Loss:  Estimated blood loss: none.      [facility]

## 2019-12-25 NOTE — Anesthesia Preprocedure Evaluation (Signed)
Anesthesia Evaluation  Patient identified by MRN, date of birth, ID band Patient awake    Reviewed: Allergy & Precautions, NPO status , Patient's Chart, lab work & pertinent test results  Airway Mallampati: II       Dental no notable dental hx.    Pulmonary sleep apnea , former smoker,    Pulmonary exam normal        Cardiovascular hypertension, negative cardio ROS Normal cardiovascular exam Rhythm:Regular Rate:Normal     Neuro/Psych negative neurological ROS  negative psych ROS   GI/Hepatic negative GI ROS, Neg liver ROS,   Endo/Other  negative endocrine ROS  Renal/GU negative Renal ROS  negative genitourinary   Musculoskeletal negative musculoskeletal ROS (+)   Abdominal   Peds negative pediatric ROS (+)  Hematology negative hematology ROS (+)   Anesthesia Other Findings .Marland KitchenPast Medical History: No date: Hyperlipidemia No date: Hypertension   Reproductive/Obstetrics negative OB ROS                             Anesthesia Physical Anesthesia Plan  ASA: II  Anesthesia Plan: General   Post-op Pain Management:    Induction: Intravenous  PONV Risk Score and Plan: 2 and Propofol infusion  Airway Management Planned: Nasal Cannula  Additional Equipment: None  Intra-op Plan:   Post-operative Plan:   Informed Consent: I have reviewed the patients History and Physical, chart, labs and discussed the procedure including the risks, benefits and alternatives for the proposed anesthesia with the patient or authorized representative who has indicated his/her understanding and acceptance.       Plan Discussed with: CRNA, Anesthesiologist and Surgeon  Anesthesia Plan Comments:         Anesthesia Quick Evaluation

## 2019-12-25 NOTE — Anesthesia Procedure Notes (Signed)
Procedure Name: General with mask airway Performed by: Fletcher-Harrison, Moriah Loughry, CRNA Pre-anesthesia Checklist: Patient identified, Emergency Drugs available, Suction available and Patient being monitored Patient Re-evaluated:Patient Re-evaluated prior to induction Oxygen Delivery Method: Simple face mask Induction Type: IV induction Placement Confirmation: positive ETCO2 and CO2 detector Dental Injury: Teeth and Oropharynx as per pre-operative assessment        

## 2019-12-25 NOTE — H&P (Signed)
Walter Bellows, MD [address], [place], [place], [place], [address] [id number], [place], [place], [place], [address] Phone: [phone]  Fax: [phone]  Primary Care Physician:  Crecencio Mc, MD   Pre-Procedure History & Physical: HPI:  Walter Osborne is a 64 y.o. male is here for an colonoscopy.   Past Medical History:  Diagnosis Date  . Hyperlipidemia   . Hypertension     Past Surgical History:  Procedure Laterality Date  . COLONOSCOPY      Prior to Admission medications   Medication Sig Start Date End Date Taking? Authorizing Provider  atorvastatin (LIPITOR) 80 MG tablet Take 1 tablet (80 mg total) by mouth daily. 12/23/19 03/22/20 Yes Agbor-Etang, Aaron Edelman, MD  diazepam (VALIUM) 10 MG tablet One tablet one hour before oral surgery 02/25/19  Yes Crecencio Mc, MD  fluticasone [facility]) 50 MCG/ACT nasal spray USE 2 SPRAYS EACH NOSTRIL DAILY 07/27/14  Yes Crecencio Mc, MD  hydrochlorothiazide (HYDRODIURIL) 25 MG tablet Take 1 tablet (25 mg total) by mouth daily. 11/12/19  Yes Crecencio Mc, MD  Loratadine (ALAVERT PO) Take by mouth. 1 dissolvable tablet as needed daily for allergies.    Yes [provider]  sildenafil (REVATIO) 20 MG tablet Take 1 tablet (20 mg total) by mouth 3 (three) times daily. 12/11/17  Yes Crecencio Mc, MD  telmisartan (MICARDIS) 40 MG tablet Take 1 tablet (40 mg total) by mouth at bedtime. 08/26/19  Yes Crecencio Mc, MD  zolpidem (AMBIEN) 10 MG tablet Take 1 tablet (10 mg total) by mouth at bedtime as needed. for sleep 08/28/19  Yes Crecencio Mc, MD  ketoconazole (NIZORAL) 2 % cream Apply 1 application topically daily. Use thin layer on affected rash areas. Do before bedtime. Patient not taking: Reported on 12/01/2019 12/05/18   Jodelle Green, FNP  nystatin (MYCOSTATIN/NYSTOP) powder Apply topically every morning. Patient not taking: Reported on 12/01/2019 12/05/18   Jodelle Green, FNP  promethazine (PHENERGAN) 12.5 MG  tablet Take 1 tablet (12.5 mg total) by mouth every 8 (eight) hours as needed for nausea or vomiting. Patient not taking: Reported on 12/01/2019 03/20/16   Crecencio Mc, MD  triamcinolone cream (KENALOG) 0.1 % Apply 1 application topically 2 (two) times daily. Until the rash resolves Patient not taking: Reported on 12/25/2019 01/08/19   Crecencio Mc, MD    Allergies as of 12/04/2019  . (No Known Allergies)    Family History  Problem Relation Age of Onset  . Hyperlipidemia Mother   . Diabetes Father   . CAD Father        MI at age 48  . Cancer Maternal Aunt        Lung CA  . Heart attack Maternal Grandfather 59    Social History   Socioeconomic History  . Marital status: Married    Spouse name: Not on file  . Number of children: Not on file  . Years of education: Not on file  . Highest education level: Not on file  Occupational History    Employer: [employer]    Comment: travels,    Tobacco Use  . Smoking status: Former Smoker    Years: 10.00    Types: Cigarettes    Quit date: 02/13/1993    Years since quitting: 26.8  . Smokeless tobacco: Never Used  Vaping Use  . Vaping Use: Never used  Substance and Sexual Activity  . Alcohol use: Yes  Alcohol/week: 4.0 standard drinks    Types: 4 Glasses of wine per week  . Drug use: No  . Sexual activity: Not on file  Other Topics Concern  . Not on file  Social History Narrative  . Not on file   Social Determinants of Health   Financial Resource Strain:   . Difficulty of Paying Living Expenses: Not on file  Food Insecurity:   . Worried About Charity fundraiser in the Last Year: Not on file  . Ran Out of Food in the Last Year: Not on file  Transportation Needs:   . Lack of Transportation (Medical): Not on file  . Lack of Transportation (Non-Medical): Not on file  Physical Activity:   . Days of Exercise per Week: Not on file  . Minutes of Exercise per Session: Not on file  Stress:   . Feeling of Stress :  Not on file  Social Connections:   . Frequency of Communication with Friends and Family: Not on file  . Frequency of Social Gatherings with Friends and Family: Not on file  . Attends Religious Services: Not on file  . Active Member of Clubs or Organizations: Not on file  . Attends Archivist Meetings: Not on file  . Marital Status: Not on file  Intimate Partner Violence:   . Fear of Current or Ex-Partner: Not on file  . Emotionally Abused: Not on file  . Physically Abused: Not on file  . Sexually Abused: Not on file    Review of Systems: See HPI, otherwise negative ROS  Physical Exam: Pulse (!) 55   Temp (!) 96.6 F (35.9 C) (Temporal)   Resp 16   Ht 6\' 1"  (1.854 m)   Wt 106.1 kg   SpO2 100%   BMI 30.87 kg/m  General:   Alert,  pleasant and cooperative in NAD Head:  Normocephalic and atraumatic. Neck:  Supple; no masses or thyromegaly. Lungs:  Clear throughout to auscultation, normal respiratory effort.    Heart:  +S1, +S2, Regular rate and rhythm, No edema. Abdomen:  Soft, nontender and nondistended. Normal bowel sounds, without guarding, and without rebound.   Neurologic:  Alert and  oriented x4;  grossly normal neurologically.  Impression/Plan: Walter Osborne is here for an colonoscopy to be performed for surveillance due to prior history of colon polyps   Risks, benefits, limitations, and alternatives regarding  colonoscopy have been reviewed with the patient.  Questions have been answered.  All parties agreeable.   Walter Bellows, MD  12/25/2019, 9:33 AM

## 2019-12-25 NOTE — Anesthesia Postprocedure Evaluation (Signed)
Anesthesia Post Note  Patient: Walter Osborne  Procedure(s) Performed: COLONOSCOPY WITH PROPOFOL (N/A )  Patient location during evaluation: Endoscopy Anesthesia Type: General Level of consciousness: awake and awake and alert Pain management: pain level controlled Vital Signs Assessment: post-procedure vital signs reviewed and stable Respiratory status: spontaneous breathing Cardiovascular status: blood pressure returned to baseline and stable Postop Assessment: no apparent nausea or vomiting Anesthetic complications: no   No complications documented.   Last Vitals:  Vitals:   12/25/19 0851 12/25/19 1017  BP:  133/79  Pulse: (!) 55   Resp: 16   Temp: (!) 35.9 C (!) 35.8 C  SpO2: 100%     Last Pain:  Vitals:   12/25/19 1047  TempSrc:   PainSc: 0-No pain                 Neva Seat

## 2019-12-30 ENCOUNTER — Other Ambulatory Visit: Payer: Self-pay

## 2019-12-30 ENCOUNTER — Ambulatory Visit
Admission: RE | Admit: 2019-12-30 | Discharge: 2019-12-30 | Disposition: A | Discharge status: Home / Self Care | Payer: BC Managed Care – PPO | Source: Ambulatory Visit | Attending: Internal Medicine | Admitting: Internal Medicine

## 2019-12-30 DIAGNOSIS — K7689 Other specified diseases of liver: Secondary | ICD-10-CM | POA: Diagnosis not present

## 2019-12-30 DIAGNOSIS — R7401 Elevation of levels of liver transaminase levels: Secondary | ICD-10-CM | POA: Insufficient documentation

## 2019-12-30 LAB — SURGICAL PATHOLOGY

## 2020-01-01 NOTE — Progress Notes (Signed)
Your recent abdominal ultrasound suggested the diagnosis of  fatty liver.     This is a diagnosis that would require liver biopsy to confirm, or an imaging study called a "Fibroscan."   If you would like to proceed,  I will refer you to Dr Allen Norris  at [facility];  he specializes in fatty liver.  If you would like to defer that procedure for now,  I recommend the same treatment  he would recommend if the diagnosis were confirmed, which is vaccination against hepatitis A and B,  and normalization of BMI to 25 or less through weight loss using a low glycemic index diet.  Low GI diets reduce your net carbohydrate intake by concentrating on eating nuts,  stone fruits  , vegetables, legumes (avoid the starchy vegetables  like potatoes and corn) , and  Limiting  your bread servings to 2 servings of whole wheat or "low carb" choices.   I know you are exercising; make your goal with exercise a minimum of 30 minutes of aerobic activity 5 days per week.     Fatty liver can not be cured, but if it is not managed,  It can lead to cirrhosis and liver failure and is becoming increasingly more common because of the prevalence of obesity in adults in the [place].   I would like to see you back in 3 months to recheck your liver enzymes and see how the lifestyle change is working . Marland Kitchen Regards,

## 2020-01-05 ENCOUNTER — Other Ambulatory Visit: Payer: Self-pay

## 2020-01-05 ENCOUNTER — Encounter: Payer: Self-pay | Admitting: Gastroenterology

## 2020-01-05 ENCOUNTER — Ambulatory Visit (INDEPENDENT_AMBULATORY_CARE_PROVIDER_SITE_OTHER): Payer: BC Managed Care – PPO | Admitting: Cardiology

## 2020-01-05 ENCOUNTER — Encounter: Payer: Self-pay | Admitting: Cardiology

## 2020-01-05 VITALS — BP 138/70 | HR 65 | Ht 73.0 in | Wt 232.1 lb

## 2020-01-05 DIAGNOSIS — I2510 Atherosclerotic heart disease of native coronary artery without angina pectoris: Secondary | ICD-10-CM

## 2020-01-05 DIAGNOSIS — E7800 Pure hypercholesterolemia, unspecified: Secondary | ICD-10-CM | POA: Diagnosis not present

## 2020-01-05 DIAGNOSIS — I10 Essential (primary) hypertension: Secondary | ICD-10-CM

## 2020-01-05 MED ORDER — ASPIRIN EC 81 MG PO TBEC: 81.0000 mg | DELAYED_RELEASE_TABLET | Freq: Every day | ORAL | 3 refills | Status: AC

## 2020-01-05 NOTE — Progress Notes (Signed)
Cardiology Office Note:    Date:  01/05/2020   ID:  Walter Osborne, Walter Osborne 13-Apr-1955, MRN [id number]  PCP:  Crecencio Mc, MD  [facility] Cardiologist:  Kate Sable, MD  [facility] Electrophysiologist:  None   Referring MD: Crecencio Mc, MD   Chief Complaint  Patient presents with  . OTHER    2 wk f/u no complaints today. Meds reviewed verbally with pt.     History of Present Illness:    Walter Osborne is a 64 y.o. male with a hx of hypertension, hyperlipidemia, OSA on CPAP, former smoker x10 years who presents for follow-up.  Previously seen for cardiac risk stratification due to dad having an MI.  Denies chest pain or shortness of breath at rest or with exertion.  Calcium score was recommended patient obtained.  He has no concerns today.   Past Medical History:  Diagnosis Date  . Hyperlipidemia   . Hypertension   . Sleep apnea     Past Surgical History:  Procedure Laterality Date  . COLONOSCOPY    . COLONOSCOPY WITH PROPOFOL N/A 12/25/2019   Procedure: COLONOSCOPY WITH PROPOFOL;  Surgeon: Jonathon Bellows, MD;  Location: [facility] END[facility]COPY;  Service: Gastroenterology;  Laterality: N/A;    Current Medications: Current Meds  Medication Sig  . atorvastatin (LIPITOR) 80 MG tablet Take 1 tablet (80 mg total) by mouth daily.  . diazepam (VALIUM) 10 MG tablet One tablet one hour before oral surgery  . fluticasone (FLONASE) 50 MCG/ACT nasal spray USE 2 SPRAYS EACH NOSTRIL DAILY  . hydrochlorothiazide (HYDRODIURIL) 25 MG tablet Take 1 tablet (25 mg total) by mouth daily.  . Loratadine (ALAVERT PO) Take by mouth. 1 dissolvable tablet as needed daily for allergies.   . sildenafil (REVATIO) 20 MG tablet Take 1 tablet (20 mg total) by mouth 3 (three) times daily.  Marland Kitchen telmisartan (MICARDIS) 40 MG tablet Take 1 tablet (40 mg total) by mouth at bedtime.  Marland Kitchen zolpidem (AMBIEN) 10 MG tablet Take 1 tablet (10 mg total) by mouth at bedtime as needed. for sleep      Allergies:   Patient has no known allergies.   Social History   Socioeconomic History  . Marital status: Married    Spouse name: Not on file  . Number of children: Not on file  . Years of education: Not on file  . Highest education level: Not on file  Occupational History    Employer: [employer]    Comment: travels,    Tobacco Use  . Smoking status: Former Smoker    Years: 10.00    Types: Cigarettes    Quit date: 02/13/1993    Years since quitting: 26.9  . Smokeless tobacco: Never Used  Vaping Use  . Vaping Use: Never used  Substance and Sexual Activity  . Alcohol use: Yes    Alcohol/week: 4.0 standard drinks    Types: 4 Glasses of wine per week  . Drug use: No  . Sexual activity: Not on file  Other Topics Concern  . Not on file  Social History Narrative  . Not on file   Social Determinants of Health   Financial Resource Strain:   . Difficulty of Paying Living Expenses: Not on file  Food Insecurity:   . Worried About Charity fundraiser in the Last Year: Not on file  . Ran Out of Food in the Last Year: Not on file  Transportation Needs:   . Lack of Transportation (Medical):  Not on file  . Lack of Transportation (Non-Medical): Not on file  Physical Activity:   . Days of Exercise per Week: Not on file  . Minutes of Exercise per Session: Not on file  Stress:   . Feeling of Stress : Not on file  Social Connections:   . Frequency of Communication with Friends and Family: Not on file  . Frequency of Social Gatherings with Friends and Family: Not on file  . Attends Religious Services: Not on file  . Active Member of Clubs or Organizations: Not on file  . Attends Archivist Meetings: Not on file  . Marital Status: Not on file     Family History: The patient's family history includes CAD in his father; Cancer in his maternal aunt; [name]; Heart attack (age of onset: 55) in his maternal grandfather; Hyperlipidemia in his  mother.  ROS:   Please see the history of present illness.     All other systems reviewed and are negative.  EKGs/Labs/Other Studies Reviewed:    The following studies were reviewed today:   EKG:  EKG is  ordered today.  The ekg ordered today demonstrates normal sinus rhythm, right bundle branch block  Recent Labs: 11/12/2019: BUN 15; Creatinine, Ser 0.99; Hemoglobin 15.1; Platelets 182.0; Potassium 4.0; Sodium 140; TSH 1.48 12/15/2019: ALT 42  Recent Lipid Panel    Component Value Date/Time   CHOL 182 11/12/2019 1020   TRIG 139.0 11/12/2019 1020   HDL 57.90 11/12/2019 1020   CHOLHDL 3 11/12/2019 1020   VLDL 27.8 11/12/2019 1020   LDLCALC 96 11/12/2019 1020   LDLDIRECT 105.0 07/24/2018 1542    Physical Exam:    VS:  BP 138/70 (BP Location: Left Arm, Patient Position: Sitting, Cuff Size: Normal)   Pulse 65   Ht 6\' 1"  (1.854 m)   Wt 232 lb 2 oz (105.3 kg)   SpO2 98%   BMI 30.63 kg/m     Wt Readings from Last 3 Encounters:  01/05/20 232 lb 2 oz (105.3 kg)  12/25/19 234 lb (106.1 kg)  12/17/19 243 lb (110.2 kg)     GEN:  Well nourished, well developed in no acute distress HEENT: Normal NECK: No JVD; No carotid bruits LYMPHATICS: No lymphadenopathy CARDIAC: RRR, no murmurs, rubs, gallops RESPIRATORY:  Clear to auscultation without rales, wheezing or rhonchi  ABDOMEN: Soft, non-tender, non-distended MUSCULOSKELETAL:  No edema; No deformity  SKIN: Warm and dry NEUROLOGIC:  Alert and oriented x 3 PSYCHIATRIC:  Normal affect   ASSESSMENT:    1. Coronary artery disease involving native coronary artery of native heart without angina pectoris   2. Primary hypertension   3. Pure hypercholesterolemia    PLAN:    In order of problems listed above:  1. Family history of MI.  Asymptomatic from a cardiac perspective.  Three-vessel coronary artery calcification. High coronary calcium score of 649.  Start aspirin 81 mg, increase Lipitor to 80 mg daily.  Goal LDL less than  70. 2. hypertension, BP well controlled.  Continue current BP meds. 3. hyperlipidemia, increase Lipitor as above.  Goal LDL less than 70. Plans to get repeat fasting lipid profile via primary care provider in 3 months.   Follow-up in 3 months.   Medication Adjustments/Labs and Tests Ordered: Current medicines are reviewed at length with the patient today.  Concerns regarding medicines are outlined above.  No orders of the defined types were placed in this encounter.  Meds ordered this encounter  Medications  . aspirin EC 81 MG tablet    Sig: Take 1 tablet (81 mg total) by mouth daily. Swallow whole.    Dispense:  90 tablet    Refill:  3    Patient Instructions  Medication Instructions:  Your physician has recommended you make the following change in your medication:   START taking Aspirin 81 MG: take 1 tab by mouth, once a day.  *If you need a refill on your cardiac medications before your next appointment, please call your pharmacy*   Lab Work: None Ordered If you have labs (blood work) drawn today and your tests are completely normal, you will receive your results only by: Marland Kitchen MyChart Message (if you have MyChart) OR . A paper copy in the mail If you have any lab test that is abnormal or we need to change your treatment, we will call you to review the results.   Testing/Procedures: None Ordered   Follow-Up: At [facility], you and your health needs are our priority.  As part of our continuing mission to provide you with exceptional heart care, we have created designated Provider Care Teams.  These Care Teams include your primary Cardiologist (physician) and Advanced Practice Providers (APPs -  Physician Assistants and Nurse Practitioners) who all work together to provide you with the care you need, when you need it.  We recommend signing up for the patient portal called "MyChart".  Sign up information is provided on this After Visit Summary.  MyChart is used to connect  with patients for Virtual Visits (Telemedicine).  Patients are able to view lab/test results, encounter notes, upcoming appointments, etc.  Non-urgent messages can be sent to your provider as well.   To learn more about what you can do with MyChart, go to [email or web address].    Your next appointment:   3 month(s)  The format for your next appointment:   In Person  Provider:   Kate Sable, MD   Other Instructions      Signed, Kate Sable, MD  01/05/2020 12:57 PM    [place]

## 2020-01-05 NOTE — Patient Instructions (Signed)
Medication Instructions:  Your physician has recommended you make the following change in your medication:   START taking Aspirin 81 MG: take 1 tab by mouth, once a day.  *If you need a refill on your cardiac medications before your next appointment, please call your pharmacy*   Lab Work: None Ordered If you have labs (blood work) drawn today and your tests are completely normal, you will receive your results only by: Marland Kitchen MyChart Message (if you have MyChart) OR . A paper copy in the mail If you have any lab test that is abnormal or we need to change your treatment, we will call you to review the results.   Testing/Procedures: None Ordered   Follow-Up: At [facility], you and your health needs are our priority.  As part of our continuing mission to provide you with exceptional heart care, we have created designated Provider Care Teams.  These Care Teams include your primary Cardiologist (physician) and Advanced Practice Providers (APPs -  Physician Assistants and Nurse Practitioners) who all work together to provide you with the care you need, when you need it.  We recommend signing up for the patient portal called "MyChart".  Sign up information is provided on this After Visit Summary.  MyChart is used to connect with patients for Virtual Visits (Telemedicine).  Patients are able to view lab/test results, encounter notes, upcoming appointments, etc.  Non-urgent messages can be sent to your provider as well.   To learn more about what you can do with MyChart, go to [email or web address].    Your next appointment:   3 month(s)  The format for your next appointment:   In Person  Provider:   Kate Sable, MD   Other Instructions

## 2020-02-23 ENCOUNTER — Other Ambulatory Visit: Payer: BC Managed Care – PPO

## 2020-02-23 ENCOUNTER — Other Ambulatory Visit: Payer: Self-pay | Admitting: Internal Medicine

## 2020-02-23 DIAGNOSIS — Z20822 Contact with and (suspected) exposure to covid-19: Secondary | ICD-10-CM

## 2020-02-23 NOTE — Telephone Encounter (Signed)
RX Refill:Ambien Last Seen:12-17-19 Last ordered:08-28-19

## 2020-02-24 LAB — SARS-COV-2, NAA 2 DAY TAT

## 2020-02-24 LAB — NOVEL CORONAVIRUS, NAA: SARS-CoV-2, NAA: NOT DETECTED

## 2020-03-11 DIAGNOSIS — Z20822 Contact with and (suspected) exposure to covid-19: Secondary | ICD-10-CM | POA: Diagnosis not present

## 2020-03-11 DIAGNOSIS — Z03818 Encounter for observation for suspected exposure to other biological agents ruled out: Secondary | ICD-10-CM | POA: Diagnosis not present

## 2020-03-19 DIAGNOSIS — G4733 Obstructive sleep apnea (adult) (pediatric): Secondary | ICD-10-CM | POA: Diagnosis not present

## 2020-03-19 DIAGNOSIS — H9390 Unspecified disorder of ear, unspecified ear: Secondary | ICD-10-CM | POA: Diagnosis not present

## 2020-03-19 DIAGNOSIS — J019 Acute sinusitis, unspecified: Secondary | ICD-10-CM | POA: Diagnosis not present

## 2020-03-22 ENCOUNTER — Other Ambulatory Visit: Payer: Self-pay

## 2020-03-22 MED ORDER — ATORVASTATIN CALCIUM 80 MG PO TABS: 80.0000 mg | ORAL_TABLET | Freq: Every day | ORAL | 3 refills | Status: DC

## 2020-04-08 ENCOUNTER — Other Ambulatory Visit: Payer: Self-pay

## 2020-04-08 ENCOUNTER — Ambulatory Visit (INDEPENDENT_AMBULATORY_CARE_PROVIDER_SITE_OTHER): Payer: BC Managed Care – PPO | Admitting: Cardiology

## 2020-04-08 ENCOUNTER — Encounter: Payer: Self-pay | Admitting: Cardiology

## 2020-04-08 VITALS — BP 128/70 | HR 70 | Ht 73.0 in | Wt 233.1 lb

## 2020-04-08 DIAGNOSIS — I2510 Atherosclerotic heart disease of native coronary artery without angina pectoris: Secondary | ICD-10-CM | POA: Diagnosis not present

## 2020-04-08 DIAGNOSIS — E7800 Pure hypercholesterolemia, unspecified: Secondary | ICD-10-CM | POA: Diagnosis not present

## 2020-04-08 DIAGNOSIS — I10 Essential (primary) hypertension: Secondary | ICD-10-CM

## 2020-04-08 NOTE — Progress Notes (Signed)
Cardiology Office Note:    Date:  04/08/2020   ID:  Cavion, Faiola 1955/06/09, MRN [id number]  PCP:  Crecencio Mc, MD  [facility] Cardiologist:  Kate Sable, MD  [facility] Electrophysiologist:  None   Referring MD: Crecencio Mc, MD   Chief Complaint  Patient presents with  . Other    3 month f/u no complaints today. Meds reviewed verbally with pt.     History of Present Illness:    Walter Osborne is a 65 y.o. male with a hx of CAD (Cor ca2+ in LAD, LCx, RCA- score 40), hypertension, hyperlipidemia, OSA on CPAP, former smoker x10 years who presents for follow-up.    Last seen after obtaining a calcium score which was elevated.  Goal LDL less than 70.  Lipitor was increased to 80 mg daily.  Aspirin was started.  Advised to obtain repeat fasting lipid profile.  Denies any new complaints or symptoms.  No chest pain, no shortness of breath.  His dad passed from an MI.  Takes all medications as prescribed.  Also eating healthier, exercise.   Past Medical History:  Diagnosis Date  . Hyperlipidemia   . Hypertension   . Sleep apnea     Past Surgical History:  Procedure Laterality Date  . COLONOSCOPY    . COLONOSCOPY WITH PROPOFOL N/A 12/25/2019   Procedure: COLONOSCOPY WITH PROPOFOL;  Surgeon: Jonathon Bellows, MD;  Location: [facility] ENDOSCOPY;  Service: Gastroenterology;  Laterality: N/A;    Current Medications: Current Meds  Medication Sig  . aspirin EC 81 MG tablet Take 1 tablet (81 mg total) by mouth daily. Swallow whole.  Marland Kitchen atorvastatin (LIPITOR) 80 MG tablet Take 1 tablet (80 mg total) by mouth daily.  . diazepam (VALIUM) 10 MG tablet One tablet one hour before oral surgery  . fluticasone (FLONASE) 50 MCG/ACT nasal spray USE 2 SPRAYS EACH NOSTRIL DAILY  . hydrochlorothiazide (HYDRODIURIL) 25 MG tablet Take 1 tablet (25 mg total) by mouth daily.  . Loratadine (ALAVERT PO) Take by mouth. 1 dissolvable tablet as needed daily for allergies.    . sildenafil (REVATIO) 20 MG tablet Take 1 tablet (20 mg total) by mouth 3 (three) times daily.  Marland Kitchen telmisartan (MICARDIS) 40 MG tablet Take 1 tablet (40 mg total) by mouth at bedtime.  Marland Kitchen zolpidem (AMBIEN) 10 MG tablet TAKE 1 TABLET BY MOUTH AT BEDTIME AS NEEDED FOR SLEEP     Allergies:   Patient has no known allergies.   Social History   Socioeconomic History  . Marital status: Married    Spouse name: Not on file  . Number of children: Not on file  . Years of education: Not on file  . Highest education level: Not on file  Occupational History    Employer: [employer]    Comment: travels,    Tobacco Use  . Smoking status: Former Smoker    Years: 10.00    Types: Cigarettes    Quit date: 02/13/1993    Years since quitting: 27.1  . Smokeless tobacco: Never Used  Vaping Use  . Vaping Use: Never used  Substance and Sexual Activity  . Alcohol use: Yes    Alcohol/week: 4.0 standard drinks    Types: 4 Glasses of wine per week  . Drug use: No  . Sexual activity: Not on file  Other Topics Concern  . Not on file  Social History Narrative  . Not on file   Social Determinants of Health  Financial Resource Strain: Not on file  Food Insecurity: Not on file  Transportation Needs: Not on file  Physical Activity: Not on file  Stress: Not on file  Social Connections: Not on file     Family History: The patient's family history includes CAD in his father; Cancer in his maternal aunt; [name]; Heart attack (age of onset: 28) in his maternal grandfather; [name].  ROS:   Please see the history of present illness.     All other systems reviewed and are negative.  EKGs/Labs/Other Studies Reviewed:    The following studies were reviewed today:   EKG:  EKG not  ordered today.  Recent Labs: 11/12/2019: BUN 15; Creatinine, Ser 0.99; Hemoglobin 15.1; Platelets 182.0; Potassium 4.0; Sodium 140; TSH 1.48 12/15/2019: ALT 42  Recent Lipid  Panel    Component Value Date/Time   CHOL 182 11/12/2019 1020   TRIG 139.0 11/12/2019 1020   HDL 57.90 11/12/2019 1020   CHOLHDL 3 11/12/2019 1020   VLDL 27.8 11/12/2019 1020   LDLCALC 96 11/12/2019 1020   LDLDIRECT 105.0 07/24/2018 1542    Physical Exam:    VS:  BP 128/70 (BP Location: Left Arm, Patient Position: Sitting, Cuff Size: Normal)   Pulse 70   Ht 6\' 1"  (1.854 m)   Wt 233 lb 2 oz (105.7 kg)   SpO2 98%   BMI 30.76 kg/m     Wt Readings from Last 3 Encounters:  04/08/20 233 lb 2 oz (105.7 kg)  01/05/20 232 lb 2 oz (105.3 kg)  12/25/19 234 lb (106.1 kg)     GEN:  Well nourished, well developed in no acute distress HEENT: Normal NECK: No JVD; No carotid bruits LYMPHATICS: No lymphadenopathy CARDIAC: RRR, no murmurs, rubs, gallops RESPIRATORY:  Clear to auscultation without rales, wheezing or rhonchi  ABDOMEN: Soft, non-tender, non-distended MUSCULOSKELETAL:  No edema; No deformity  SKIN: Warm and dry NEUROLOGIC:  Alert and oriented x 3 PSYCHIATRIC:  Normal affect   ASSESSMENT:    1. Coronary artery disease involving native coronary artery of native heart without angina pectoris   2. Primary hypertension   3. Pure hypercholesterolemia    PLAN:    In order of problems listed above:  1. CAD, three-vessel coronary artery calcification. High coronary calcium score of 649.  Denies chest pain or shortness of breath.  Check lipid panel with primary care provider in 2 weeks.  Continue Lipitor 80, aspirin 81 mg for primary prevention. 2. hypertension, BP well controlled.  Continue current BP meds. 3. hyperlipidemia, Lipitor 80 mg.  Follow-up in 6-12 months.   Medication Adjustments/Labs and Tests Ordered: Current medicines are reviewed at length with the patient today.  Concerns regarding medicines are outlined above.  Orders Placed This Encounter  Procedures  . EKG 12-Lead   No orders of the defined types were placed in this encounter.   Patient  Instructions  Medication Instructions:  Your physician recommends that you continue on your current medications as directed. Please refer to the Current Medication list given to you today. *If you need a refill on your cardiac medications before your next appointment, please call your pharmacy*   Lab Work: None ordered If you have labs (blood work) drawn today and your tests are completely normal, you will receive your results only by: Marland Kitchen MyChart Message (if you have MyChart) OR . A paper copy in the mail If you have any lab test that is abnormal or we need to  change your treatment, we will call you to review the results.   Testing/Procedures: None ordered   Follow-Up: At [facility], you and your health needs are our priority.  As part of our continuing mission to provide you with exceptional heart care, we have created designated Provider Care Teams.  These Care Teams include your primary Cardiologist (physician) and Advanced Practice Providers (APPs -  Physician Assistants and Nurse Practitioners) who all work together to provide you with the care you need, when you need it.  We recommend signing up for the patient portal called "MyChart".  Sign up information is provided on this After Visit Summary.  MyChart is used to connect with patients for Virtual Visits (Telemedicine).  Patients are able to view lab/test results, encounter notes, upcoming appointments, etc.  Non-urgent messages can be sent to your provider as well.   To learn more about what you can do with MyChart, go to [email or web address].    Your next appointment:   1 year(s)  The format for your next appointment:   In Person  Provider:   Kate Sable, MD   Other Instructions      Signed, Kate Sable, MD  04/08/2020 12:27 PM    [place]

## 2020-04-08 NOTE — Patient Instructions (Signed)

## 2020-04-21 DIAGNOSIS — Z08 Encounter for follow-up examination after completed treatment for malignant neoplasm: Secondary | ICD-10-CM | POA: Diagnosis not present

## 2020-04-21 DIAGNOSIS — Z85828 Personal history of other malignant neoplasm of skin: Secondary | ICD-10-CM | POA: Diagnosis not present

## 2020-04-21 DIAGNOSIS — D485 Neoplasm of uncertain behavior of skin: Secondary | ICD-10-CM | POA: Diagnosis not present

## 2020-04-21 DIAGNOSIS — B078 Other viral warts: Secondary | ICD-10-CM | POA: Diagnosis not present

## 2020-04-21 DIAGNOSIS — L821 Other seborrheic keratosis: Secondary | ICD-10-CM | POA: Diagnosis not present

## 2020-04-21 DIAGNOSIS — L720 Epidermal cyst: Secondary | ICD-10-CM | POA: Diagnosis not present

## 2020-04-21 DIAGNOSIS — R208 Other disturbances of skin sensation: Secondary | ICD-10-CM | POA: Diagnosis not present

## 2020-04-21 DIAGNOSIS — C44519 Basal cell carcinoma of skin of other part of trunk: Secondary | ICD-10-CM | POA: Diagnosis not present

## 2020-04-21 DIAGNOSIS — L570 Actinic keratosis: Secondary | ICD-10-CM | POA: Diagnosis not present

## 2020-04-21 DIAGNOSIS — L820 Inflamed seborrheic keratosis: Secondary | ICD-10-CM | POA: Diagnosis not present

## 2020-04-21 DIAGNOSIS — C44319 Basal cell carcinoma of skin of other parts of face: Secondary | ICD-10-CM | POA: Diagnosis not present

## 2020-06-20 ENCOUNTER — Other Ambulatory Visit: Payer: Self-pay | Admitting: Internal Medicine

## 2020-06-21 DIAGNOSIS — I10 Essential (primary) hypertension: Secondary | ICD-10-CM

## 2020-06-21 DIAGNOSIS — Z03818 Encounter for observation for suspected exposure to other biological agents ruled out: Secondary | ICD-10-CM | POA: Diagnosis not present

## 2020-06-21 DIAGNOSIS — Z20822 Contact with and (suspected) exposure to covid-19: Secondary | ICD-10-CM | POA: Diagnosis not present

## 2020-06-22 ENCOUNTER — Other Ambulatory Visit: Payer: Self-pay

## 2020-06-22 MED ORDER — ATORVASTATIN CALCIUM 80 MG PO TABS: 80.0000 mg | ORAL_TABLET | Freq: Every day | ORAL | 3 refills | Status: DC

## 2020-06-23 ENCOUNTER — Encounter: Payer: Self-pay | Admitting: Adult Health

## 2020-06-23 ENCOUNTER — Telehealth (INDEPENDENT_AMBULATORY_CARE_PROVIDER_SITE_OTHER): Payer: BC Managed Care – PPO | Admitting: Adult Health

## 2020-06-23 VITALS — Ht 72.99 in | Wt 229.0 lb

## 2020-06-23 DIAGNOSIS — U071 COVID-19: Secondary | ICD-10-CM

## 2020-06-23 DIAGNOSIS — R0981 Nasal congestion: Secondary | ICD-10-CM | POA: Diagnosis not present

## 2020-06-23 NOTE — Progress Notes (Signed)
Virtual Visit via Video Note  I connected with Walter Osborne on 06/24/20 at  4:30 PM EDT by a video enabled telemedicine application and verified that I am speaking with the correct person using two identifiers. Parties involved in visit as below:   Location: Patient: at home  Provider: Provider: Provider's office at  [facility], [place].      I discussed the limitations of evaluation and management by telemedicine and the availability of in person appointments. The patient expressed understanding and agreed to proceed.  History of Present Illness:  Patient is a 65 year old male in no acute distress, diagnosed on 06/21/2020 and symptoms started 06/19/2020. He is vaccinated 2 vaccines and booster for covid.   He is having some productive cough - clear sputum.  Mild chest congestion and head congestion.   Oxygen 97 % heart rate 67/ Patient  denies any fever, body aches,chills, rash, chest Osborne, shortness of breath, nausea, vomiting, or diarrhea.  Denies dizziness, lightheadedness, pre syncopal or syncopal episodes.  " feeling much better today than yesterday " .  Taking Nyquil/ Dayquil.   Observations/Objective:   Patient is alert and oriented and responsive to questions Engages in conversation with provider. Speaks in full sentences without any pauses without any shortness of breath or distress.   Assessment and Plan:  The primary encounter diagnosis was Lab test positive for detection of COVID-19 virus. A diagnosis of Sinus congestion was also pertinent to this visit.   Continue over the counter treatment as discussed since he is doing well;.  He feels much better today and feels less than a cold he reports.   Call back or be seen in person at anytime if symptoms persist, change or worsen at anytime.   Follow Up Instructions:  Advised in person evaluation at anytime is advised if any symptoms do not improve, worsen or change at any given  time.  Red Flags discussed. The patient was given clear instructions to go to ER or return to medical center if any red flags develop, symptoms do not improve, worsen or new problems develop. They verbalized understanding.   I discussed the assessment and treatment plan with the patient. The patient was provided an opportunity to ask questions and all were answered. The patient agreed with the plan and demonstrated an understanding of the instructions.   The patient was advised to call back or seek an in-person evaluation if the symptoms worsen or if the condition fails to improve as anticipated.  Return in about 4 days (around 06/27/2020), or if symptoms worsen or fail to improve, for at any time for any worsening symptoms, Go to Emergency room/ urgent care if worse. Marcille Buffy, FNP

## 2020-06-24 ENCOUNTER — Encounter: Payer: Self-pay | Admitting: Adult Health

## 2020-06-24 NOTE — Patient Instructions (Signed)

## 2020-07-14 DIAGNOSIS — C4441 Basal cell carcinoma of skin of scalp and neck: Secondary | ICD-10-CM | POA: Diagnosis not present

## 2020-07-14 DIAGNOSIS — C44319 Basal cell carcinoma of skin of other parts of face: Secondary | ICD-10-CM | POA: Diagnosis not present

## 2020-07-31 ENCOUNTER — Other Ambulatory Visit: Payer: Self-pay | Admitting: Internal Medicine

## 2020-08-02 MED ORDER — ZOLPIDEM TARTRATE 10 MG PO TABS
10.0000 mg | ORAL_TABLET | Freq: Every evening | ORAL | 4 refills | Status: DC | PRN
Start: 2020-08-02 — End: 2020-11-18

## 2020-08-02 NOTE — Telephone Encounter (Signed)
Patient called for the status of refill. Patient is going out of town on Thursday morning and will need a refill before then.

## 2020-08-02 NOTE — Telephone Encounter (Signed)
Patient last seen 06/23/20 and med last sent in 02/24/20 with 30 pills 4 refills.   Pended for your approval or denial.

## 2020-08-17 ENCOUNTER — Other Ambulatory Visit: Payer: Self-pay | Admitting: Internal Medicine

## 2020-09-18 ENCOUNTER — Other Ambulatory Visit: Payer: Self-pay

## 2020-09-18 ENCOUNTER — Other Ambulatory Visit: Payer: Self-pay | Admitting: Internal Medicine

## 2020-09-20 MED ORDER — TELMISARTAN 40 MG PO TABS
40.0000 mg | ORAL_TABLET | Freq: Every day | ORAL | 0 refills | Status: DC
Start: 2020-09-20 — End: 2020-11-18

## 2020-10-26 ENCOUNTER — Other Ambulatory Visit: Payer: Self-pay

## 2020-10-26 ENCOUNTER — Other Ambulatory Visit: Payer: Self-pay | Admitting: Internal Medicine

## 2020-10-26 ENCOUNTER — Telehealth: Payer: Self-pay

## 2020-10-26 DIAGNOSIS — Z0000 Encounter for general adult medical examination without abnormal findings: Secondary | ICD-10-CM

## 2020-10-26 MED ORDER — ATORVASTATIN CALCIUM 80 MG PO TABS: 80.0000 mg | ORAL_TABLET | Freq: Every day | ORAL | 4 refills | Status: DC

## 2020-10-26 NOTE — Telephone Encounter (Signed)
Pt is scheduled for labs before his physical in October. I have ordered lipid panel, cmp, cbc, a1c, tsh, psa and microalbumin. Is there anything else that needs to be ordered?

## 2020-11-15 ENCOUNTER — Other Ambulatory Visit: Payer: Self-pay

## 2020-11-15 ENCOUNTER — Other Ambulatory Visit (INDEPENDENT_AMBULATORY_CARE_PROVIDER_SITE_OTHER): Payer: BC Managed Care – PPO

## 2020-11-15 DIAGNOSIS — R7401 Elevation of levels of liver transaminase levels: Secondary | ICD-10-CM

## 2020-11-15 DIAGNOSIS — Z125 Encounter for screening for malignant neoplasm of prostate: Secondary | ICD-10-CM | POA: Diagnosis not present

## 2020-11-15 DIAGNOSIS — Z0000 Encounter for general adult medical examination without abnormal findings: Secondary | ICD-10-CM

## 2020-11-15 DIAGNOSIS — E782 Mixed hyperlipidemia: Secondary | ICD-10-CM | POA: Diagnosis not present

## 2020-11-15 DIAGNOSIS — I10 Essential (primary) hypertension: Secondary | ICD-10-CM | POA: Diagnosis not present

## 2020-11-15 DIAGNOSIS — E669 Obesity, unspecified: Secondary | ICD-10-CM

## 2020-11-15 LAB — COMPREHENSIVE METABOLIC PANEL
ALT: 29 U/L (ref 0–53)
AST: 34 U/L (ref 0–37)
Albumin: 4.2 g/dL (ref 3.5–5.2)
Alkaline Phosphatase: 75 U/L (ref 39–117)
BUN: 18 mg/dL (ref 6–23)
CO2: 27 mEq/L (ref 19–32)
Calcium: 9.4 mg/dL (ref 8.4–10.5)
Chloride: 104 mEq/L (ref 96–112)
Creatinine, Ser: 1.01 mg/dL (ref 0.40–1.50)
GFR: 78.83 mL/min (ref >60.00)
Glucose, Bld: 95 mg/dL (ref 70–99)
Potassium: 3.6 mEq/L (ref 3.5–5.1)
Sodium: 140 mEq/L (ref 135–145)
Total Bilirubin: 0.7 mg/dL (ref 0.2–1.2)
Total Protein: 6.7 g/dL (ref 6.0–8.3)

## 2020-11-15 LAB — CBC WITH DIFFERENTIAL/PLATELET
Basophils Absolute: 0.1 10*3/uL (ref 0.0–0.1)
Basophils Relative: 0.8 % (ref 0.0–3.0)
Eosinophils Absolute: 0.2 10*3/uL (ref 0.0–0.7)
Eosinophils Relative: 3.2 % (ref 0.0–5.0)
HCT: 42.6 % (ref 39.0–52.0)
Hemoglobin: 15.1 g/dL (ref 13.0–17.0)
Lymphocytes Relative: 26.6 % (ref 12.0–46.0)
Lymphs Abs: 2 10*3/uL (ref 0.7–4.0)
MCHC: 35.4 g/dL (ref 30.0–36.0)
MCV: 90.5 fl (ref 78.0–100.0)
Monocytes Absolute: 0.7 10*3/uL (ref 0.1–1.0)
Monocytes Relative: 9.5 % (ref 3.0–12.0)
Neutro Abs: 4.6 10*3/uL (ref 1.4–7.7)
Neutrophils Relative %: 59.9 % (ref 43.0–77.0)
Platelets: 182 10*3/uL (ref 150.0–400.0)
RBC: 4.71 Mil/uL (ref 4.22–5.81)
RDW: 13.4 % (ref 11.5–15.5)
WBC: 7.7 10*3/uL (ref 4.0–10.5)

## 2020-11-15 LAB — MICROALBUMIN / CREATININE URINE RATIO
Creatinine,U: 82.3 mg/dL
Microalb Creat Ratio: 3.6 mg/g (ref 0.0–30.0)
Microalb, Ur: 3 mg/dL — ABNORMAL HIGH (ref 0.0–1.9)

## 2020-11-15 LAB — LIPID PANEL
Cholesterol: 146 mg/dL (ref 0–200)
HDL: 51.7 mg/dL (ref >39.00)
LDL Cholesterol: 73 mg/dL (ref 0–99)
NonHDL: 94.4
Total CHOL/HDL Ratio: 3
Triglycerides: 109 mg/dL (ref 0.0–149.0)
VLDL: 21.8 mg/dL (ref 0.0–40.0)

## 2020-11-15 LAB — TSH: TSH: 2.93 u[IU]/mL (ref 0.35–5.50)

## 2020-11-15 LAB — HEMOGLOBIN A1C: Hgb A1c MFr Bld: 5.7 % (ref 4.6–6.5)

## 2020-11-15 LAB — PSA: PSA: 9.51 ng/mL — ABNORMAL HIGH (ref 0.10–4.00)

## 2020-11-16 DIAGNOSIS — R972 Elevated prostate specific antigen [PSA]: Secondary | ICD-10-CM

## 2020-11-16 NOTE — Progress Notes (Signed)
Diagnoses changed on lab orders

## 2020-11-17 DIAGNOSIS — Z08 Encounter for follow-up examination after completed treatment for malignant neoplasm: Secondary | ICD-10-CM | POA: Diagnosis not present

## 2020-11-17 DIAGNOSIS — B078 Other viral warts: Secondary | ICD-10-CM | POA: Diagnosis not present

## 2020-11-17 DIAGNOSIS — R972 Elevated prostate specific antigen [PSA]: Secondary | ICD-10-CM | POA: Insufficient documentation

## 2020-11-17 DIAGNOSIS — Z85828 Personal history of other malignant neoplasm of skin: Secondary | ICD-10-CM | POA: Diagnosis not present

## 2020-11-17 DIAGNOSIS — D225 Melanocytic nevi of trunk: Secondary | ICD-10-CM | POA: Diagnosis not present

## 2020-11-17 DIAGNOSIS — L538 Other specified erythematous conditions: Secondary | ICD-10-CM | POA: Diagnosis not present

## 2020-11-17 NOTE — Assessment & Plan Note (Signed)
Referring to Urology for workup   Lab Results  Component Value Date   PSA 9.51 (H) 11/15/2020   PSA 2.34 11/12/2019   PSA 2.15 12/06/2017

## 2020-11-18 ENCOUNTER — Ambulatory Visit (INDEPENDENT_AMBULATORY_CARE_PROVIDER_SITE_OTHER): Payer: BC Managed Care – PPO | Admitting: Internal Medicine

## 2020-11-18 ENCOUNTER — Encounter: Payer: Self-pay | Admitting: Internal Medicine

## 2020-11-18 ENCOUNTER — Other Ambulatory Visit: Payer: Self-pay

## 2020-11-18 VITALS — BP 118/82 | HR 68 | Temp 95.8°F | Ht 72.0 in | Wt 237.2 lb

## 2020-11-18 DIAGNOSIS — G4700 Insomnia, unspecified: Secondary | ICD-10-CM | POA: Diagnosis not present

## 2020-11-18 DIAGNOSIS — R972 Elevated prostate specific antigen [PSA]: Secondary | ICD-10-CM | POA: Diagnosis not present

## 2020-11-18 DIAGNOSIS — I10 Essential (primary) hypertension: Secondary | ICD-10-CM

## 2020-11-18 DIAGNOSIS — G4733 Obstructive sleep apnea (adult) (pediatric): Secondary | ICD-10-CM

## 2020-11-18 DIAGNOSIS — K760 Fatty (change of) liver, not elsewhere classified: Secondary | ICD-10-CM

## 2020-11-18 DIAGNOSIS — E669 Obesity, unspecified: Secondary | ICD-10-CM

## 2020-11-18 DIAGNOSIS — D126 Benign neoplasm of colon, unspecified: Secondary | ICD-10-CM | POA: Diagnosis not present

## 2020-11-18 DIAGNOSIS — E782 Mixed hyperlipidemia: Secondary | ICD-10-CM

## 2020-11-18 DIAGNOSIS — Z0000 Encounter for general adult medical examination without abnormal findings: Secondary | ICD-10-CM | POA: Diagnosis not present

## 2020-11-18 DIAGNOSIS — G4730 Sleep apnea, unspecified: Secondary | ICD-10-CM

## 2020-11-18 DIAGNOSIS — R7301 Impaired fasting glucose: Secondary | ICD-10-CM

## 2020-11-18 DIAGNOSIS — Z9989 Dependence on other enabling machines and devices: Secondary | ICD-10-CM

## 2020-11-18 MED ORDER — ZOLPIDEM TARTRATE 10 MG PO TABS: 10.0000 mg | ORAL_TABLET | Freq: Every evening | ORAL | 5 refills | Status: DC | PRN

## 2020-11-18 MED ORDER — ATORVASTATIN CALCIUM 80 MG PO TABS: 80.0000 mg | ORAL_TABLET | Freq: Every day | ORAL | 1 refills | Status: DC

## 2020-11-18 MED ORDER — METFORMIN HCL ER 500 MG PO TB24: 500.0000 mg | ORAL_TABLET | Freq: Every day | ORAL | 0 refills | Status: DC

## 2020-11-18 MED ORDER — TELMISARTAN 40 MG PO TABS: 40.0000 mg | ORAL_TABLET | Freq: Every day | ORAL | 1 refills | Status: DC

## 2020-11-18 NOTE — Progress Notes (Signed)
Patient ID: Walter Osborne, male    DOB: 1955/06/07  Age: 65 y.o. MRN: [id number]  The patient is here for annual preventive  examination and management of other chronic and acute problems.  This visit occurred during the SARS-CoV-2 public health emergency.  Safety protocols were in place, including screening questions prior to the visit, additional usage of staff PPE, and extensive cleaning of exam room while observing appropriate contact time as indicated for disinfecting solutions.     The risk factors are reflected in the social history.  The roster of all physicians providing medical care to patient - is listed in the Snapshot section of the chart.  Activities of daily living:  The patient is 100% independent in all ADLs: dressing, toileting, feeding as well as independent mobility  Home safety : The patient has smoke detectors in the home. They wear seatbelts.  There are no firearms at home. There is no violence in the home.   There is no risks for hepatitis, STDs or HIV. There is no   history of blood transfusion. They have no travel history to infectious disease endemic areas of the world.  The patient has seen their dentist in the last six month. They have seen their eye doctor in the last year. They admit to slight hearing difficulty with regard to whispered voices and some television programs.  They have deferred audiologic testing in the last year.  They do not  have excessive sun exposure. Discussed the need for sun protection: hats, long sleeves and use of sunscreen if there is significant sun exposure.   Diet: the importance of a healthy diet is discussed. They do have a healthy diet.  The benefits of regular aerobic exercise were discussed. He is exercising for 30 minutes daily using the treadmill ,   situps  etc    Depression screen: there are no signs or vegative symptoms of depression- irritability, change in appetite, anhedonia, sadness/tearfullness.  Cognitive  assessment: the patient manages all their financial and personal affairs and is actively engaged. They could relate day,date,year and events; recalled 2/3 objects at 3 minutes; performed clock-face test normally.  The following portions of the patient's history were reviewed and updated as appropriate: allergies, current medications, past family history, past medical history,  past surgical history, past social history  and problem list.  Visual acuity was not assessed per patient preference since she has regular follow up with her ophthalmologist. Hearing and body mass index were assessed and reviewed.   During the course of the visit the patient was educated and counseled about appropriate screening and preventive services including : fall prevention , diabetes screening, nutrition counseling, colorectal cancer screening, and recommended immunizations.    CC: The primary encounter diagnosis was Encounter for preventive health examination. Diagnoses of Mixed hyperlipidemia, Impaired fasting glucose, Obesity (BMI 30-39.9), Insomnia w/ sleep apnea, OSA on CPAP, Primary hypertension, Serrated adenoma of colon, Hepatic steatosis, and Elevated PSA, less than 10 ng/ml were also pertinent to this visit.   1) Elevated PSA: No symptoms   stream not as strong as in 20's  but not an acute change,  and no retention , hesitancy or dysuria.  No history of UTI.   No history  of Prostate CA    2) Had COVID infection in May.  Last Booster .  3) Fatty liver:  liver enzymes  normal this year , adhering to the mediterranean diet and less fat.   No daily alcohol   [name]  has a past medical history of Hyperlipidemia, Hypertension, and Sleep apnea.   He has a past surgical history that includes Colonoscopy and Colonoscopy with propofol (N/A, 12/25/2019).   His family history includes CAD in his father; Cancer in his maternal aunt; [name]; Heart attack (age of onset: 23) in his maternal  grandfather; [name].He reports that he quit smoking about 27 years ago. His smoking use included cigarettes. He has never used smokeless tobacco. He reports current alcohol use of about 4.0 standard drinks per week. He reports that he does not use drugs.  Outpatient Medications Prior to Visit  Medication Sig Dispense Refill   aspirin EC 81 MG tablet Take 1 tablet (81 mg total) by mouth daily. Swallow whole. 90 tablet 3   fluticasone (FLONASE) 50 MCG/ACT nasal spray USE 2 SPRAYS EACH NOSTRIL DAILY 16 g 1   hydrochlorothiazide (HYDRODIURIL) 25 MG tablet Take 1 tablet by mouth once daily 90 tablet 1   Loratadine (ALAVERT PO) Take by mouth. 1 dissolvable tablet as needed daily for allergies.      sildenafil (REVATIO) 20 MG tablet Take 1 tablet (20 mg total) by mouth 3 (three) times daily. 30 tablet 11   atorvastatin (LIPITOR) 80 MG tablet Take 1 tablet (80 mg total) by mouth daily. 30 tablet 4   telmisartan (MICARDIS) 40 MG tablet Take 1 tablet (40 mg total) by mouth at bedtime. 30 tablet 0   zolpidem (AMBIEN) 10 MG tablet Take 1 tablet (10 mg total) by mouth at bedtime as needed. for sleep 30 tablet 4   diazepam (VALIUM) 10 MG tablet One tablet one hour before oral surgery (Patient not taking: No sig reported) 10 tablet 0   telmisartan (MICARDIS) 40 MG tablet TAKE 1 TABLET BY MOUTH AT BEDTIME (Patient not taking: Reported on 11/18/2020) 30 tablet 0   No facility-administered medications prior to visit.    Review of Systems  Patient denies headache, fevers, malaise, unintentional weight loss, skin rash, eye pain, sinus congestion and sinus pain, sore throat, dysphagia,  hemoptysis , cough, dyspnea, wheezing, chest pain, palpitations, orthopnea, edema, abdominal pain, nausea, melena, diarrhea, constipation, flank pain, dysuria, hematuria, urinary  Frequency, nocturia, numbness, tingling, seizures,  Focal weakness, Loss of consciousness,  Tremor, insomnia, depression, anxiety, and  suicidal ideation.    Objective:  BP 118/82 (BP Location: Left Arm, Patient Position: Sitting, Cuff Size: Large)   Pulse 68   Temp (!) 95.8 F (35.4 C) (Temporal)   Ht 6' (1.829 m)   Wt 237 lb 3.2 oz (107.6 kg)   SpO2 96%   BMI 32.17 kg/m   Physical Exam  General appearance: alert, cooperative and appears stated age Ears: normal TM's and external ear canals both ears Throat: lips, mucosa, and tongue normal; teeth and gums normal Neck: no adenopathy, no carotid bruit, supple, symmetrical, trachea midline and thyroid not enlarged, symmetric, no tenderness/mass/nodules Back: symmetric, no curvature. ROM normal. No CVA tenderness. Lungs: clear to auscultation bilaterally Heart: regular rate and rhythm, S1, S2 normal, no murmur, click, rub or gallop Abdomen: soft, non-tender; bowel sounds normal; no masses,  no organomegaly Pulses: 2+ and symmetric Skin: Skin color, texture, turgor normal. No rashes or lesions Lymph nodes: Cervical, supraclavicular, and axillary nodes normal.   Assessment & Plan:   Problem List Items Addressed This Visit       Unprioritized   Elevated PSA, less than 10 ng/ml    He is asymptomatic but has had a quadrupling of  PSA over the past year.  Urology referral made       Encounter for preventive health examination - Primary    age appropriate education and counseling updated, referrals for preventative services and immunizations addressed, dietary and smoking counseling addressed, most recent labs reviewed.  I have personally reviewed and have noted:   1) the patient's medical and social history 2) The pt's use of alcohol, tobacco, and illicit drugs 3) The patient's current medications and supplements 4) Functional ability including ADL's, fall risk, home safety risk, hearing and visual impairment 5) Diet and physical activities 6) Evidence for depression or mood disorder 7) The patient's height, weight, and BMI have been recorded in the chart   I  have made referrals, and provided counseling and education based on review of the above      Hepatic steatosis    Presumed by ultrasound changes and serologies negative for autoimmune causes of hepatitis in November 2021.  He was offered referral to liver specialist at time of workup but declined.  He has been followign a low GI diet and restricting alcohol use. . Walter Osborne to get Hep A/B vaccines at pharmacy.  Trial of metformin, already taking a statin .     Lab Results  Component Value Date   ALT 29 11/15/2020   AST 34 11/15/2020   ALKPHOS 75 11/15/2020   BILITOT 0.7 11/15/2020         Hyperlipidemia    Goal is now 70 due to high coronary  calcium score.  His  dose of lipitor was increased to 80 mg by Dr Sharion Settler and is now at goal  Lab Results  Component Value Date   CHOL 146 11/15/2020   HDL 51.70 11/15/2020   LDLCALC 73 11/15/2020   LDLDIRECT 105.0 07/24/2018   TRIG 109.0 11/15/2020   CHOLHDL 3 11/15/2020         Relevant Medications   telmisartan (MICARDIS) 40 MG tablet   atorvastatin (LIPITOR) 80 MG tablet   Other Relevant Orders   Comprehensive metabolic panel   Lipid panel   Hypertension    At goal on telmisartan and hct      Relevant Medications   telmisartan (MICARDIS) 40 MG tablet   atorvastatin (LIPITOR) 80 MG tablet   Insomnia w/ sleep apnea    Managed with 10 mg zolpidem.  Warned patient that dose will be reduced to  5 mg when he turns 66      Obesity (BMI 30-39.9)    With fatty liver, OSA  and a1c of 5.7  Recommending trial of  metformin       Relevant Medications   metFORMIN (GLUCOPHAGE XR) 500 MG 24 hr tablet   OSA on CPAP    Diagnosed by sleep study. e is wearing her CPAP every night a minimum of 6 hours per night and notes improved daytime wakefulness and decreased fatigue       Serrated adenoma of colon    Retrieved during 2021 colonoscopy.  3 yr follow up planned  2024      Other Visit Diagnoses     Impaired fasting glucose        Relevant Orders   Hemoglobin A1c       Meds ordered this encounter  Medications   metFORMIN (GLUCOPHAGE XR) 500 MG 24 hr tablet    Sig: Take 1 tablet (500 mg total) by mouth daily with breakfast.    Dispense:  90 tablet    Refill:  0   telmisartan (MICARDIS) 40 MG tablet    Sig: Take 1 tablet (40 mg total) by mouth at bedtime.    Dispense:  90 tablet    Refill:  1   zolpidem (AMBIEN) 10 MG tablet    Sig: Take 1 tablet (10 mg total) by mouth at bedtime as needed. for sleep    Dispense:  30 tablet    Refill:  5   atorvastatin (LIPITOR) 80 MG tablet    Sig: Take 1 tablet (80 mg total) by mouth daily.    Dispense:  90 tablet    Refill:  1    Medications Discontinued During This Encounter  Medication Reason   diazepam (VALIUM) 10 MG tablet    telmisartan (MICARDIS) 40 MG tablet Duplicate   telmisartan (MICARDIS) 40 MG tablet Reorder   zolpidem (AMBIEN) 10 MG tablet Reorder   atorvastatin (LIPITOR) 80 MG tablet Reorder    Follow-up: Return in about 6 months (around 05/19/2021).   Crecencio Mc, MD

## 2020-11-18 NOTE — Assessment & Plan Note (Addendum)
Goal is now 70 due to high coronary  calcium score.  His  dose of lipitor was increased to 80 mg by Dr Sharion Settler and is now at goal  Lab Results  Component Value Date   CHOL 146 11/15/2020   HDL 51.70 11/15/2020   LDLCALC 73 11/15/2020   LDLDIRECT 105.0 07/24/2018   TRIG 109.0 11/15/2020   CHOLHDL 3 11/15/2020

## 2020-11-18 NOTE — Assessment & Plan Note (Addendum)
With fatty liver, OSA  and a1c of 5.7  Recommending trial of  metformin

## 2020-11-18 NOTE — Assessment & Plan Note (Signed)
Diagnosed by sleep study. e is wearing her CPAP every night a minimum of 6 hours per night and notes improved daytime wakefulness and decreased fatigue

## 2020-11-18 NOTE — Assessment & Plan Note (Signed)
At goal on telmisartan and hct

## 2020-11-18 NOTE — Assessment & Plan Note (Signed)
Managed with 10 mg zolpidem.  Warned patient that dose will be reduced to  5 mg when he turns 66

## 2020-11-18 NOTE — Patient Instructions (Addendum)
Check with insurance about coverage for hepatitis A and B vaccines ( recommended for patients with fatty liver )  Glucophage XR (metformin) is also recommended for patient with fatty liver.  It helps curb appetite handles some of the metabolic changes that occur with fatty liver  it is taken once daily   Your LDL is at goal on atorvastatin 80 mg daily  .  Continue it along with a baby asa.  We will repeat your labs in 6 months    Your referral is in process as discussed to [place] [place].  Our referral coordinator will call you when the appointment has been made.  If you do not hear from our office in a week,   Please call us back.

## 2020-11-20 NOTE — Assessment & Plan Note (Addendum)
Presumed by ultrasound changes and serologies negative for autoimmune causes of hepatitis in November 2021.  He was offered referral to liver specialist at time of workup but declined.  He has been followign a low GI diet and restricting alcohol use. . Walter Osborne to get Hep A/B vaccines at pharmacy.  Trial of metformin, already taking a statin .     Lab Results  Component Value Date   ALT 29 11/15/2020   AST 34 11/15/2020   ALKPHOS 75 11/15/2020   BILITOT 0.7 11/15/2020

## 2020-11-20 NOTE — Assessment & Plan Note (Signed)

## 2020-11-20 NOTE — Assessment & Plan Note (Signed)
He is asymptomatic but has had a quadrupling of PSA over the past year.  Urology referral made

## 2020-11-20 NOTE — Assessment & Plan Note (Signed)
Retrieved during 2021 colonoscopy.  3 yr follow up planned  2024

## 2020-11-23 ENCOUNTER — Ambulatory Visit (INDEPENDENT_AMBULATORY_CARE_PROVIDER_SITE_OTHER): Payer: BC Managed Care – PPO | Admitting: Urology

## 2020-11-23 ENCOUNTER — Encounter: Payer: Self-pay | Admitting: Urology

## 2020-11-23 ENCOUNTER — Other Ambulatory Visit: Payer: Self-pay

## 2020-11-23 VITALS — BP 166/94 | HR 66 | Ht 72.0 in | Wt 237.0 lb

## 2020-11-23 DIAGNOSIS — R972 Elevated prostate specific antigen [PSA]: Secondary | ICD-10-CM

## 2020-11-23 LAB — URINALYSIS, COMPLETE
Bilirubin, UA: NEGATIVE
Glucose, UA: NEGATIVE
Ketones, UA: NEGATIVE
Leukocytes,UA: NEGATIVE
Nitrite, UA: NEGATIVE
Protein,UA: NEGATIVE
RBC, UA: NEGATIVE
Specific Gravity, UA: 1.01 (ref 1.005–1.030)
Urobilinogen, Ur: 0.2 mg/dL (ref 0.2–1.0)
pH, UA: 6.5 (ref 5.0–7.5)

## 2020-11-23 LAB — MICROSCOPIC EXAMINATION
Bacteria, UA: NONE SEEN
Epithelial Cells (non renal): NONE SEEN /hpf (ref 0–10)

## 2020-11-23 NOTE — Progress Notes (Signed)
11/23/2020 12:20 PM   Walter Osborne Dec 06, 1955 [id number]  Referring provider: Crecencio Mc, MD [place],  [place] [address]  Chief Complaint  Patient presents with   Elevated PSA    HPI: 65 year old male referred for further evaluation of elevated PSA.  Notably, most recent routine labs, his PSA had elevated to 9.51, previously within the normal range.  He denies any family history of prostate cancer or problems.  He denies any urinary symptoms.  He has an excellent stream, does not get up at night to void, and is done extremely well.  He takes no medicines for urinary issues.  He denies ejaculating or any mechanical manipulation of his prostate prior to this blood draw.  Urinalysis today is negative   Component PSA  Latest Ref Rng & Units 0.10 - 4.00 ng/mL  11/29/2016 3.38  12/06/2017 2.15  11/12/2019 2.34  11/15/2020 9.51 (H)    PMH: Past Medical History:  Diagnosis Date   Hyperlipidemia    Hypertension    Sleep apnea     Surgical History: Past Surgical History:  Procedure Laterality Date   COLONOSCOPY     COLONOSCOPY WITH PROPOFOL N/A 12/25/2019   Procedure: COLONOSCOPY WITH PROPOFOL;  Surgeon: Jonathon Bellows, MD;  Location: [facility] ENDOSCOPY;  Service: Gastroenterology;  Laterality: N/A;    Home Medications:  Allergies as of 11/23/2020   No Known Allergies      Medication List        Accurate as of November 23, 2020 12:20 PM. If you have any questions, ask your nurse or doctor.          ALAVERT PO Take by mouth. 1 dissolvable tablet as needed daily for allergies.   aspirin EC 81 MG tablet Take 1 tablet (81 mg total) by mouth daily. Swallow whole.   atorvastatin 80 MG tablet Commonly known as: LIPITOR Take 1 tablet (80 mg total) by mouth daily.   fluticasone 50 MCG/ACT nasal spray Commonly known as: FLONASE USE 2 SPRAYS EACH NOSTRIL DAILY   hydrochlorothiazide 25 MG tablet Commonly known as:  HYDRODIURIL Take 1 tablet by mouth once daily   metFORMIN 500 MG 24 hr tablet Commonly known as: Glucophage XR Take 1 tablet (500 mg total) by mouth daily with breakfast.   sildenafil 20 MG tablet Commonly known as: REVATIO Take 1 tablet (20 mg total) by mouth 3 (three) times daily.   telmisartan 40 MG tablet Commonly known as: MICARDIS Take 1 tablet (40 mg total) by mouth at bedtime.   zolpidem 10 MG tablet Commonly known as: AMBIEN Take 1 tablet (10 mg total) by mouth at bedtime as needed. for sleep        Allergies: No Known Allergies  Family History: Family History  Problem Relation Age of Onset   Hyperlipidemia Mother    Diabetes Father    CAD Father        MI at age 35   Cancer Maternal Aunt        Lung CA   Heart attack Maternal Grandfather 78    Social History:  reports that he quit smoking about 27 years ago. His smoking use included cigarettes. He has never used smokeless tobacco. He reports current alcohol use of about 4.0 standard drinks per week. He reports that he does not use drugs.   Physical Exam: BP (!) 166/94   Pulse 66   Ht 6' (1.829 m)   Wt 237 lb (107.5 kg)   BMI  32.14 kg/m   Constitutional:  Alert and oriented, No acute distress. HEENT: [place] AT, moist mucus membranes.  Trachea midline, no masses. Cardiovascular: No clubbing, cyanosis, or edema. Respiratory: Normal respiratory effort, no increased work of breathing. Rectal: External hemorrhoids appreciated.  Normal external sphincter.  50 cc prostate with rubbery lateral lobes, nontender, no nodules Skin: No rashes, bruises or suspicious lesions. Neurologic: Grossly intact, no focal deficits, moving all 4 extremities. Psychiatric: Normal mood and affect.  Laboratory Data: Lab Results  Component Value Date   WBC 7.7 11/15/2020   HGB 15.1 11/15/2020   HCT 42.6 11/15/2020   MCV 90.5 11/15/2020   PLT 182.0 11/15/2020    Lab Results  Component Value Date   CREATININE 1.01 11/15/2020    Assessment & Plan:    1. Elevated PSA  We reviewed the implications of an elevated PSA and the uncertainty surrounding it. In general, a man's PSA increases with age and is produced by both normal and cancerous prostate tissue. Differential for elevated PSA is BPH, prostate cancer, infection, recent intercourse/ejaculation, prostate infarction, recent urethroscopic manipulation (foley placement/cystoscopy) and prostatitis. Management of an elevated PSA can include observation or prostate biopsy and wediscussed this in detail. We discussed that indications for prostate biopsy are defined by age and race specific PSA cutoffs as well as a PSA velocity of 0.75/year.  No evidence of infection is contributing factor  We will plan to recheck his PSA today, if remains elevated, will pursue prostate biopsy.  We discussed prostate biopsy in detail including the procedure itself, the risks of blood in the urine, stool, and ejaculate, serious infection, and discomfort.   - Urinalysis, Complete - PSA; Future - PSA  Will call tomorrow with repeat PSA value and follow-up recommendations  Hollice Espy, MD  [facility] [address], [place],  [address] [phone]

## 2020-11-24 ENCOUNTER — Telehealth: Payer: Self-pay | Admitting: *Deleted

## 2020-11-24 DIAGNOSIS — R972 Elevated prostate specific antigen [PSA]: Secondary | ICD-10-CM

## 2020-11-24 LAB — PSA: Prostate Specific Ag, Serum: 6.5 ng/mL — ABNORMAL HIGH (ref 0.0–4.0)

## 2020-11-24 NOTE — Telephone Encounter (Addendum)
Patient informed, scheduled follow up. Voiced understanding.    ----- Message from Hollice Espy, MD sent at 11/24/2020  8:57 AM EDT ----- PSA remains markedly elevated but is trending back downward which is reassuring.  Lets have him repeat this test again in 3 months.  If it normalizes, no need for biopsy, if remains elevated then we will pursue biopsy at that time.  Hollice Espy, MD

## 2020-12-10 MED ORDER — TELMISARTAN 80 MG PO TABS: 80.0000 mg | ORAL_TABLET | Freq: Every day | ORAL | 0 refills | Status: DC

## 2020-12-10 NOTE — Assessment & Plan Note (Signed)
Readings remain > 150 o 40 mg telmisartan and 25 mg hctz    Advised to increase telmisartan to 80 mg daily

## 2021-01-27 ENCOUNTER — Ambulatory Visit (INDEPENDENT_AMBULATORY_CARE_PROVIDER_SITE_OTHER): Payer: BC Managed Care – PPO

## 2021-01-27 ENCOUNTER — Other Ambulatory Visit: Payer: Self-pay

## 2021-01-27 DIAGNOSIS — Z23 Encounter for immunization: Secondary | ICD-10-CM

## 2021-02-22 ENCOUNTER — Other Ambulatory Visit: Payer: BC Managed Care – PPO

## 2021-02-22 ENCOUNTER — Other Ambulatory Visit: Payer: Self-pay

## 2021-02-22 DIAGNOSIS — R972 Elevated prostate specific antigen [PSA]: Secondary | ICD-10-CM | POA: Diagnosis not present

## 2021-02-23 LAB — PSA: Prostate Specific Ag, Serum: 3 ng/mL (ref 0.0–4.0)

## 2021-04-05 ENCOUNTER — Encounter: Payer: Self-pay | Admitting: Internal Medicine

## 2021-04-06 ENCOUNTER — Telehealth (INDEPENDENT_AMBULATORY_CARE_PROVIDER_SITE_OTHER): Payer: BC Managed Care – PPO | Admitting: Registered Nurse

## 2021-04-06 DIAGNOSIS — J019 Acute sinusitis, unspecified: Secondary | ICD-10-CM | POA: Diagnosis not present

## 2021-04-06 DIAGNOSIS — B9689 Other specified bacterial agents as the cause of diseases classified elsewhere: Secondary | ICD-10-CM

## 2021-04-06 MED ORDER — AMOXICILLIN 875 MG PO TABS: 875.0000 mg | ORAL_TABLET | Freq: Two times a day (BID) | ORAL | 0 refills | Status: AC

## 2021-04-06 MED ORDER — PREDNISONE 10 MG (21) PO TBPK
ORAL_TABLET | ORAL | 0 refills | Status: DC
Start: 2021-04-06 — End: 2021-05-19

## 2021-04-06 NOTE — Progress Notes (Signed)
Telemedicine Encounter- SOAP NOTE Established Patient  This video encounter was conducted with the patient's (or proxy's) verbal consent via audio telecommunications: yes/no: Yes Patient was instructed to have this encounter in a suitably private space; and to only have persons present to whom they give permission to participate. In addition, patient identity was confirmed by use of name plus two identifiers (DOB and address).  I discussed the limitations, risks, security and privacy concerns of performing an evaluation and management service by telephone and the availability of in person appointments. I also discussed with the patient that there may be a patient responsible charge related to this service. The patient expressed understanding and agreed to proceed.  I spent a total of 16 minutes talking with the patient or their proxy.  Patient at home Provider in office  Participants: Kathrin Ruddy, NP and Orvilla Fus Zito  No chief complaint on file.   Subjective   Walter Osborne is a 66 y.o. established patient. video visit today for sinus infection  HPI Onset 10 days ago Left side face pressure in frontal and ethmoid sinus. Pressure over teeth - feels like toothache  Denies shob, doe, chest pain, fever, nvd  Patient Active Problem List   Diagnosis Date Noted   Elevated PSA, less than 10 ng/ml 11/17/2020   Olecranon bursitis 12/01/2019   Hepatic steatosis 11/15/2019   Left anterior knee pain 11/13/2019   Educated about COVID-19 virus infection 07/25/2018   Serrated adenoma of colon 06/11/2017   ED (erectile dysfunction) 09/19/2016   Encounter for preventive health examination 01/24/2014   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   OSA on CPAP 12/16/2012   Hypertension 06/13/2011   Obesity (BMI 30-39.9) 06/13/2011   Hyperlipidemia     Past Medical History:  Diagnosis Date   Hyperlipidemia    Hypertension    Sleep apnea     Current Outpatient Medications   Medication Sig Dispense Refill   aspirin EC 81 MG tablet Take 1 tablet (81 mg total) by mouth daily. Swallow whole. 90 tablet 3   atorvastatin (LIPITOR) 80 MG tablet Take 1 tablet (80 mg total) by mouth daily. 90 tablet 1   fluticasone (FLONASE) 50 MCG/ACT nasal spray USE 2 SPRAYS EACH NOSTRIL DAILY 16 g 1   hydrochlorothiazide (HYDRODIURIL) 25 MG tablet Take 1 tablet by mouth once daily 90 tablet 1   Loratadine (ALAVERT PO) Take by mouth. 1 dissolvable tablet as needed daily for allergies.      metFORMIN (GLUCOPHAGE XR) 500 MG 24 hr tablet Take 1 tablet (500 mg total) by mouth daily with breakfast. 90 tablet 0   sildenafil (REVATIO) 20 MG tablet Take 1 tablet (20 mg total) by mouth 3 (three) times daily. 30 tablet 11   telmisartan (MICARDIS) 80 MG tablet Take 1 tablet (80 mg total) by mouth daily. 90 tablet 0   zolpidem (AMBIEN) 10 MG tablet Take 1 tablet (10 mg total) by mouth at bedtime as needed. for sleep 30 tablet 5   No current facility-administered medications for this visit.    No Known Allergies  Social History   Socioeconomic History   Marital status: Married    Spouse name: Not on file   Number of children: Not on file   Years of education: Not on file   Highest education level: Not on file  Occupational History    Employer: [employer]    Comment: travels,    Tobacco Use   Smoking status: Former  Years: 10.00    Types: Cigarettes    Quit date: 02/13/1993    Years since quitting: 28.1   Smokeless tobacco: Never  Vaping Use   Vaping Use: Never used  Substance and Sexual Activity   Alcohol use: Yes    Alcohol/week: 4.0 standard drinks    Types: 4 Glasses of wine per week   Drug use: No   Sexual activity: Not on file  Other Topics Concern   Not on file  Social History Narrative   Not on file   Social Determinants of Health   Financial Resource Strain: Not on file  Food Insecurity: Not on file  Transportation Needs: Not on file  Physical Activity:  Not on file  Stress: Not on file  Social Connections: Not on file  Intimate Partner Violence: Not on file    Review of Systems  Constitutional: Negative.   HENT:  Positive for congestion and sinus pain.   Eyes: Negative.   Respiratory: Negative.    Cardiovascular: Negative.   Gastrointestinal: Negative.   Genitourinary: Negative.   Musculoskeletal: Negative.   Skin: Negative.   Neurological: Negative.   Endo/Heme/Allergies: Negative.   Psychiatric/Behavioral: Negative.    All other systems reviewed and are negative.  Objective   Vitals as reported by the patient: There were no vitals filed for this visit.  There are no diagnoses linked to this encounter.   I discussed the assessment and treatment plan with the patient. The patient was provided an opportunity to ask questions and all were answered. The patient agreed with the plan and demonstrated an understanding of the instructions.   The patient was advised to call back or seek an in-person evaluation if the symptoms worsen or if the condition fails to improve as anticipated.  PLAN Acute bacterial sinusitis. Amoxicillin and prednisone as above Supportive care with rest, hydration, and saline rinse Return if worsening or failing to improve.  Patient encouraged to call clinic with any questions, comments, or concerns.  I provided 16 minutes of face-to-face time during this encounter.  Maximiano Coss, NP

## 2021-04-27 ENCOUNTER — Other Ambulatory Visit: Payer: Self-pay | Admitting: Internal Medicine

## 2021-05-16 ENCOUNTER — Other Ambulatory Visit (INDEPENDENT_AMBULATORY_CARE_PROVIDER_SITE_OTHER): Payer: BC Managed Care – PPO

## 2021-05-16 ENCOUNTER — Other Ambulatory Visit: Payer: Self-pay | Admitting: Internal Medicine

## 2021-05-16 DIAGNOSIS — E782 Mixed hyperlipidemia: Secondary | ICD-10-CM

## 2021-05-16 DIAGNOSIS — R7301 Impaired fasting glucose: Secondary | ICD-10-CM

## 2021-05-16 LAB — COMPREHENSIVE METABOLIC PANEL
ALT: 24 U/L (ref 0–53)
AST: 35 U/L (ref 0–37)
Albumin: 4.1 g/dL (ref 3.5–5.2)
Alkaline Phosphatase: 58 U/L (ref 39–117)
BUN: 20 mg/dL (ref 6–23)
CO2: 26 mEq/L (ref 19–32)
Calcium: 9.2 mg/dL (ref 8.4–10.5)
Chloride: 107 mEq/L (ref 96–112)
Creatinine, Ser: 0.98 mg/dL (ref 0.40–1.50)
GFR: 81.45 mL/min (ref >60.00)
Glucose, Bld: 103 mg/dL — ABNORMAL HIGH (ref 70–99)
Potassium: 3.6 mEq/L (ref 3.5–5.1)
Sodium: 141 mEq/L (ref 135–145)
Total Bilirubin: 0.7 mg/dL (ref 0.2–1.2)
Total Protein: 6.4 g/dL (ref 6.0–8.3)

## 2021-05-16 LAB — LIPID PANEL
Cholesterol: 145 mg/dL (ref 0–200)
HDL: 47.3 mg/dL (ref >39.00)
LDL Cholesterol: 74 mg/dL (ref 0–99)
NonHDL: 97.79
Total CHOL/HDL Ratio: 3
Triglycerides: 120 mg/dL (ref 0.0–149.0)
VLDL: 24 mg/dL (ref 0.0–40.0)

## 2021-05-16 LAB — HEMOGLOBIN A1C: Hgb A1c MFr Bld: 5.6 % (ref 4.6–6.5)

## 2021-05-18 ENCOUNTER — Other Ambulatory Visit: Payer: Self-pay

## 2021-05-18 MED ORDER — TELMISARTAN 80 MG PO TABS: 80.0000 mg | ORAL_TABLET | Freq: Every day | ORAL | 1 refills | Status: DC

## 2021-05-19 ENCOUNTER — Ambulatory Visit (INDEPENDENT_AMBULATORY_CARE_PROVIDER_SITE_OTHER): Payer: BC Managed Care – PPO | Admitting: Internal Medicine

## 2021-05-19 ENCOUNTER — Telehealth: Payer: Self-pay | Admitting: Internal Medicine

## 2021-05-19 ENCOUNTER — Encounter: Payer: Self-pay | Admitting: Internal Medicine

## 2021-05-19 VITALS — BP 132/78 | HR 61 | Temp 98.1°F | Ht 73.0 in | Wt 237.4 lb

## 2021-05-19 DIAGNOSIS — K760 Fatty (change of) liver, not elsewhere classified: Secondary | ICD-10-CM

## 2021-05-19 DIAGNOSIS — B9689 Other specified bacterial agents as the cause of diseases classified elsewhere: Secondary | ICD-10-CM

## 2021-05-19 DIAGNOSIS — I10 Essential (primary) hypertension: Secondary | ICD-10-CM | POA: Diagnosis not present

## 2021-05-19 DIAGNOSIS — R7301 Impaired fasting glucose: Secondary | ICD-10-CM | POA: Diagnosis not present

## 2021-05-19 DIAGNOSIS — J019 Acute sinusitis, unspecified: Secondary | ICD-10-CM

## 2021-05-19 DIAGNOSIS — R5383 Other fatigue: Secondary | ICD-10-CM | POA: Diagnosis not present

## 2021-05-19 DIAGNOSIS — E669 Obesity, unspecified: Secondary | ICD-10-CM

## 2021-05-19 DIAGNOSIS — E782 Mixed hyperlipidemia: Secondary | ICD-10-CM | POA: Diagnosis not present

## 2021-05-19 DIAGNOSIS — R972 Elevated prostate specific antigen [PSA]: Secondary | ICD-10-CM

## 2021-05-19 MED ORDER — HEPATITIS A-HEP B RECOMB VAC 720-20 ELU-MCG/ML IM SUSY: PREFILLED_SYRINGE | INTRAMUSCULAR | 2 refills | Status: DC

## 2021-05-19 MED ORDER — ZOLPIDEM TARTRATE 10 MG PO TABS: 10.0000 mg | ORAL_TABLET | Freq: Every evening | ORAL | 5 refills | Status: DC | PRN

## 2021-05-19 MED ORDER — ATORVASTATIN CALCIUM 80 MG PO TABS: 80.0000 mg | ORAL_TABLET | Freq: Every day | ORAL | 1 refills | Status: DC

## 2021-05-19 MED ORDER — PREDNISONE 10 MG (21) PO TBPK: ORAL_TABLET | ORAL | 0 refills | Status: DC

## 2021-05-19 NOTE — Assessment & Plan Note (Addendum)
Repeat PSA's x 2 by Urology [name] have normalized.  Next PSA is due in Jan 2024 ?

## 2021-05-19 NOTE — Telephone Encounter (Signed)
Pt is transferring over to [employer]... Pt Schedule appt date is 11/16/2021 at 9:00am ?

## 2021-05-19 NOTE — Progress Notes (Signed)
? ?Subjective:  ?Patient ID: Walter Osborne, male    DOB: January 26, 1956  Age: 66 y.o. MRN: [id number] ? ?CC: The primary encounter diagnosis was Primary hypertension. Diagnoses of Mixed hyperlipidemia, Impaired fasting glucose, Fatigue, unspecified type, Hepatic steatosis, Acute bacterial sinusitis, Obesity (BMI 30-39.9), and Elevated PSA, less than 10 ng/ml were also pertinent to this visit. ? ? ?This visit occurred during the SARS-CoV-2 public health emergency.  Safety protocols were in place, including screening questions prior to the visit, additional usage of staff PPE, and extensive cleaning of exam room while observing appropriate contact time as indicated for disinfecting solutions.   ? ?HPI ?Walter Osborne presents for follow up on hypertension, obesity and fatty liver ?Chief Complaint  ?Patient presents with  ? Follow-up  ?  6 month follow up on hypertension   ? ?1)  Aortic atheroscerlosis:   Reviewed findings of prior CT scan today..  Patient is tolerating high potency statin therapy   ? ?2) HTN:  TAKING HCTZ 25 mg and TELMISARTAN 80 mg WITHOUT ISSUES.  patient checks blood pressure twice weekly at home.  Readings have been for the most part <  140/80 at rest . Patient is following a reduced salt, Mediterranean  diet most days and is taking medications as prescribed  ? ?3) Obesty with fatty liver :  he is exercising 3 times per week and restricting his carbohydrates.  Eating a mediterranean style diet.  Tried the metformin but did not tolerate it due to abdominal pain.  Not losing weigh.  Reviewed diet,/snacks and exercise routine ? ? ?Outpatient Medications Prior to Visit  ?Medication Sig Dispense Refill  ? aspirin EC 81 MG tablet Take 1 tablet (81 mg total) by mouth daily. Swallow whole. 90 tablet 3  ? fluticasone (FLONASE) 50 MCG/ACT nasal spray USE 2 SPRAYS EACH NOSTRIL DAILY 16 g 1  ? hydrochlorothiazide (HYDRODIURIL) 25 MG tablet Take 1 tablet by mouth once daily 90 tablet 1  ? Loratadine  (ALAVERT PO) Take by mouth. 1 dissolvable tablet as needed daily for allergies.     ? sildenafil (REVATIO) 20 MG tablet Take 1 tablet (20 mg total) by mouth 3 (three) times daily. 30 tablet 11  ? telmisartan (MICARDIS) 80 MG tablet Take 1 tablet (80 mg total) by mouth daily. 90 tablet 1  ? metFORMIN (GLUCOPHAGE XR) 500 MG 24 hr tablet Take 1 tablet (500 mg total) by mouth daily with breakfast. 90 tablet 0  ? predniSONE (STERAPRED UNI-PAK 21 TAB) 10 MG (21) TBPK tablet Take per package instructions. Do not skip doses. Finish entire supply. 1 each 0  ? zolpidem (AMBIEN) 10 MG tablet Take 1 tablet (10 mg total) by mouth at bedtime as needed. for sleep 30 tablet 5  ? atorvastatin (LIPITOR) 80 MG tablet Take 1 tablet (80 mg total) by mouth daily. 90 tablet 1  ? ?No facility-administered medications prior to visit.  ? ? ?Review of Systems; ? ?Patient denies headache, fevers, malaise, unintentional weight loss, skin rash, eye pain, sinus congestion and sinus pain, sore throat, dysphagia,  hemoptysis , cough, dyspnea, wheezing, chest pain, palpitations, orthopnea, edema, abdominal pain, nausea, melena, diarrhea, constipation, flank pain, dysuria, hematuria, urinary  Frequency, nocturia, numbness, tingling, seizures,  Focal weakness, Loss of consciousness,  Tremor, insomnia, depression, anxiety, and suicidal ideation.   ? ? ? ?Objective:  ?BP 132/78 (BP Location: Left Arm, Patient Position: Sitting, Cuff Size: Large)   Pulse 61   Temp 98.1 ?F (36.7 ?C) (Oral)  Ht '6\' 1"'$  (1.854 m)   Wt 237 lb 6.4 oz (107.7 kg)   SpO2 97%   BMI 31.32 kg/m?  ? ?BP Readings from Last 3 Encounters:  ?05/19/21 132/78  ?11/23/20 (!) 166/94  ?11/18/20 118/82  ? ? ?Wt Readings from Last 3 Encounters:  ?05/19/21 237 lb 6.4 oz (107.7 kg)  ?11/23/20 237 lb (107.5 kg)  ?11/18/20 237 lb 3.2 oz (107.6 kg)  ? ? ?General appearance: alert, cooperative and appears stated age ?Ears: normal TM's and external ear canals both ears ?Throat: lips, mucosa, and  tongue normal; teeth and gums normal ?Neck: no adenopathy, no carotid bruit, supple, symmetrical, trachea midline and thyroid not enlarged, symmetric, no tenderness/mass/nodules ?Back: symmetric, no curvature. ROM normal. No CVA tenderness. ?Lungs: clear to auscultation bilaterally ?Heart: regular rate and rhythm, S1, S2 normal, no murmur, click, rub or gallop ?Abdomen: soft, non-tender; bowel sounds normal; no masses,  no organomegaly ?Pulses: 2+ and symmetric ?Skin: Skin color, texture, turgor normal. No rashes or lesions ?Lymph nodes: Cervical, supraclavicular, and axillary nodes normal. ? ?Lab Results  ?Component Value Date  ? HGBA1C 5.6 05/16/2021  ? HGBA1C 5.7 11/15/2020  ? HGBA1C 5.7 11/12/2019  ? ? ?Lab Results  ?Component Value Date  ? CREATININE 0.98 05/16/2021  ? CREATININE 1.01 11/15/2020  ? CREATININE 0.99 11/12/2019  ? ? ?Lab Results  ?Component Value Date  ? WBC 7.7 11/15/2020  ? HGB 15.1 11/15/2020  ? HCT 42.6 11/15/2020  ? PLT 182.0 11/15/2020  ? GLUCOSE 103 (H) 05/16/2021  ? CHOL 145 05/16/2021  ? TRIG 120.0 05/16/2021  ? HDL 47.30 05/16/2021  ? LDLDIRECT 105.0 07/24/2018  ? [place] 74 05/16/2021  ? ALT 24 05/16/2021  ? AST 35 05/16/2021  ? NA 141 05/16/2021  ? K 3.6 05/16/2021  ? CL 107 05/16/2021  ? CREATININE 0.98 05/16/2021  ? BUN 20 05/16/2021  ? CO2 26 05/16/2021  ? TSH 2.93 11/15/2020  ? PSA 9.51 (H) 11/15/2020  ? HGBA1C 5.6 05/16/2021  ? MICROALBUR 3.0 (H) 11/15/2020  ? ? ?US Abdomen Limited RUQ (LIVER/GB) ? ?Result Date: 12/30/2019 ?CLINICAL DATA:  Elevated AST EXAM: ULTRASOUND ABDOMEN LIMITED RIGHT UPPER QUADRANT COMPARISON:  None. FINDINGS: Gallbladder: No gallstones or wall thickening visualized. No sonographic Murphy sign noted by sonographer. Common bile duct: Diameter: 4.2 mm Liver: Increased echogenicity liver diffusely without focal liver lesion. Portal vein is patent on color Doppler imaging with normal direction of blood flow towards the liver. Other: Negative for ascites  IMPRESSION: Negative for gallstones Echogenic liver suggesting fatty infiltration. Electronically Signed   By: Franchot Gallo M.D.   On: 12/30/2019 15:05  ? ? ?Assessment & Plan:  ? ?Problem List Items Addressed This Visit   ? ? Hypertension - Primary  ? Relevant Medications  ? atorvastatin (LIPITOR) 80 MG tablet  ? Other Relevant Orders  ? Comprehensive metabolic panel  ? Microalbumin / creatinine urine ratio  ? Obesity (BMI 30-39.9)  ?  Recommended reducing use of peanuts for snacks and increasing exercise to 5 days per week  ?  ?  ? Hyperlipidemia  ?  At goal on atorvastatin 80 mg . No changes today ? ?Lab Results  ?Component Value Date  ? CHOL 145 05/16/2021  ? HDL 47.30 05/16/2021  ? [place] 74 05/16/2021  ? LDLDIRECT 105.0 07/24/2018  ? TRIG 120.0 05/16/2021  ? CHOLHDL 3 05/16/2021  ? ?  ?  ? Relevant Medications  ? atorvastatin (LIPITOR) 80 MG tablet  ? Other Relevant  Orders  ? Lipid panel  ? Hepatic steatosis  ?  Presumed by ultrasound changes and serologies negative for autoimmune causes of hepatitis in November 2021.  He was offered referral to liver specialist at time of workup but declined.  He has been following a low GI diet  Reducing alcohol use, , exercising and taking meds  But did not take the prescribed metformin. . . [name] to get Hep A/B vaccines at pharmacy.  LFTS have normalized  ? ?Lab Results  ?Component Value Date  ? ALT 24 05/16/2021  ? AST 35 05/16/2021  ? ALKPHOS 58 05/16/2021  ? BILITOT 0.7 05/16/2021  ? ? ?  ?  ? Elevated PSA, less than 10 ng/ml  ?  Repeat PSA's x 2 by Urology [name] have normalized.  Next PSA is due in Jan 2024 ?  ?  ? ?Other Visit Diagnoses   ? ? Impaired fasting glucose      ? Relevant Orders  ? Hemoglobin A1c  ? Fatigue, unspecified type      ? Relevant Orders  ? CBC with Differential/Platelet  ? TSH  ? Acute bacterial sinusitis      ? Relevant Medications  ? predniSONE (STERAPRED UNI-PAK 21 TAB) 10 MG (21) TBPK tablet  ? Hepatitis A-Hep B [place]comb Vac  (TWINRIX) 720-20 ELU-MCG/ML injection  ? ?  ? ? ?I spent 30 minutes dedicated to the care of this patient on the date of this encounter to include pre-visit review of patient's medical history,  most recent  office visit

## 2021-05-19 NOTE — Assessment & Plan Note (Signed)
At goal on atorvastatin 80 mg . No changes today ? ?Lab Results  ?Component Value Date  ? CHOL 145 05/16/2021  ? HDL 47.30 05/16/2021  ? [place] 74 05/16/2021  ? LDLDIRECT 105.0 07/24/2018  ? TRIG 120.0 05/16/2021  ? CHOLHDL 3 05/16/2021  ? ?

## 2021-05-19 NOTE — Assessment & Plan Note (Signed)
Recommended reducing use of peanuts for snacks and increasing exercise to 5 days per week  ?

## 2021-05-19 NOTE — Assessment & Plan Note (Addendum)
Presumed by ultrasound changes and serologies negative for autoimmune causes of hepatitis in November 2021.  He was offered referral to liver specialist at time of workup but declined.  He has been following a low GI diet  Reducing alcohol use, , exercising and taking meds  But did not take the prescribed metformin. . . [name] to get Hep A/B vaccines at pharmacy.  LFTS have normalized  ? ?Lab Results  ?Component Value Date  ? ALT 24 05/16/2021  ? AST 35 05/16/2021  ? ALKPHOS 58 05/16/2021  ? BILITOT 0.7 05/16/2021  ? ? ?

## 2021-05-19 NOTE — Patient Instructions (Addendum)
No need to start metformin.  You've normalized your liver enzymes with lifestyle modifications.    ? ? I would, however consider getting the Hepatitis  A /B vaccine (called " twin rx")  at [facility].  The schedule is 0,  1 and 6 months  ? ?Increase the walking 1) frequency to 5 days/week  2) pace and/or incline so that you are breathing hard halfway through ? ?CUT BACK ON PEANUTS.  1 CUP IS AROUND 800 CALORIES !!! ?

## 2021-06-09 NOTE — Telephone Encounter (Signed)
[facility] [employer] confirmed 11/15/2021 ?

## 2021-06-18 ENCOUNTER — Telehealth: Payer: BC Managed Care – PPO | Admitting: Urgent Care

## 2021-06-18 DIAGNOSIS — M109 Gout, unspecified: Secondary | ICD-10-CM

## 2021-06-19 MED ORDER — PREDNISONE 20 MG PO TABS: 40.0000 mg | ORAL_TABLET | Freq: Every day | ORAL | 0 refills | Status: AC

## 2021-06-19 MED ORDER — COLCHICINE 0.6 MG PO TABS: ORAL_TABLET | ORAL | 0 refills | Status: DC

## 2021-06-19 NOTE — Progress Notes (Signed)
E-Visit for Gout Symptoms ? ?We are sorry that you are not feeling well. We are here to help! ? ?Based on what you shared with me it looks like you have a flare of your gout.  Gout is a form of arthritis. It can cause pain and swelling in the joints. At first, it tends to affect only 1 joint - most frequently the big toe. It happens in people who have too much uric acid in the blood. Uric acid is a chemical that is produced when the body breaks down certain foods. Uric acid can form sharp needle-like crystals that build up in the joints and cause pain. Uric acid crystals can also form inside the tubes that carry urine from the kidneys to the bladder. These crystals can turn into "kidney stones" that can cause pain and problems with the flow of urine. People with gout get sudden "flares" or attacks of severe pain, most often the big toe, ankle, or knee. Often the joint also turns red and swells. Usually, only 1 joint is affected, but some people have pain in more than 1 joint. Gout flares tend to happen more often during the night. ? ?The pain from gout can be extreme. The pain and swelling are worst at the beginning of a gout flare. The symptoms then get better within a few days to weeks. It is not clear how the body "turns off" a gout flare. ? ?Do not start any NEW preventative medicine until the gout has cleared completely. However, If you are already on Probenecid or Allopurinol for CHRONIC gout, you may continue taking this during an active flare up ? ?I have prescribed Colchicine 0.6 mg tabs - Take 2 tabs immediately, then 1 tab twice per day for the duration of the flare up to a max of 7 days (but discontinue for stomach pains or diarrhea)  and I have prescribed Prednisone 40 mg daily for 7 ? ?You might want to discuss your hydrochlorothiazide with your PCP - this diuretic for some people can make them more susceptible to gout flares. ? ?STOP your atorvastatin on the days you take colchicine, then resume  taking it once therapy completed. ? ? ?HOME CARE ?Losing weight can help relieve gout. ?It's not clear that following a specific diet plan will help with gout symptoms but eating a balanced diet can help improve your overall health. It can also help you lose weight, if you are overweight. ?In general, a healthy diet includes plenty of fruits, vegetables, whole grains, and low-fat dairy products (labelled ?low fat?, skim, 2%). Avoid sugar sweetened drinks (including sodas, tea, juice and juice blends, coffee drinks and sports drinks) Limit alcohol to 1-2 drinks of beer, spirits or wine daily these can make gout flares worse. ?Some people with gout also have other health problems, such as heart disease, high blood pressure, kidney disease, or obesity. If you have any of these issues, it's important to work with your doctor to manage them. This can help improve your overall health and might also help with your gout. ? ?GET HELP RIGHT AWAY IF: ?Your symptoms persist after you have completed your treatment plan ?You develop severe diarrhea ?You develop abnormal sensations ? You develop vomiting,  ? You develop weakness ? You develop abdominal pain ? ?FOLLOW UP WITH YOUR PRIMARY PROVIDER IF: ?If your symptoms do not improve within 10 days ? ?MAKE SURE YOU  ?Understand these instructions. ?Will watch your condition. ?Will get help right away if you are  not doing well or get worse. ? ?Thank you for choosing an e-visit. ? ?Your e-visit answers were reviewed by a board certified advanced clinical practitioner to complete your personal care plan. Depending upon the condition, your plan could have included both over the counter or prescription medications. ? ?Please review your pharmacy choice. Make sure the pharmacy is open so you can pick up prescription now. If there is a problem, you may contact your provider through [employer] and have the prescription routed to another pharmacy.  Your safety is important to [place]. If  you have drug allergies check your prescription carefully.  ? ?For the next 24 hours you can use MyChart to ask questions about today's visit, request a non-urgent call back, or ask for a work or school excuse. ?You will get an email in the next two days asking about your experience. I hope that your e-visit has been valuable and will speed your recovery.  ? ? ?I have spent 5 minutes in review of e-visit questionnaire, review and updating patient chart, medical decision making and response to patient.  ? ?Arland Usery L Kizzi Overbey, PA ? ?  ?

## 2021-06-30 IMAGING — CT CT CARDIAC CORONARY ARTERY CALCIUM SCORE
3 series · 14 of 20 positions shown, 16 images · non-contrast
Comparison: None.
COMPARISON: None.

Addendum:
EXAM:
OVER-READ INTERPRETATION  CT CHEST

The following report is an over-read performed by radiologist Dr.
Ebk Vielma [REDACTED] on 12/22/2019. This
over-read does not include interpretation of cardiac or coronary
anatomy or pathology. The coronary calcium score interpretation by
the cardiologist is attached.
CLINICAL DATA: Risk stratification
Coronary Calcium Score
TECHNIQUE: The patient was scanned on a Siemens go.Top Scanner. Axial
non-contrast 3 mm slices were carried out through the heart. The
data set was analyzed on a dedicated work station and scored using
the Agatson method.

[Series 2: sa36 calcium scoring 3.00 · axial · 0.44mm/px · z∈[-1144,-1045]mm · 4 of 56 slices shown]
[im 12/56  vessel]
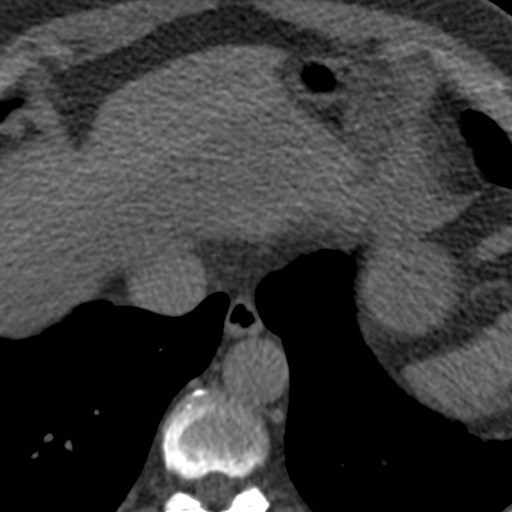
[im 23/56  vessel]
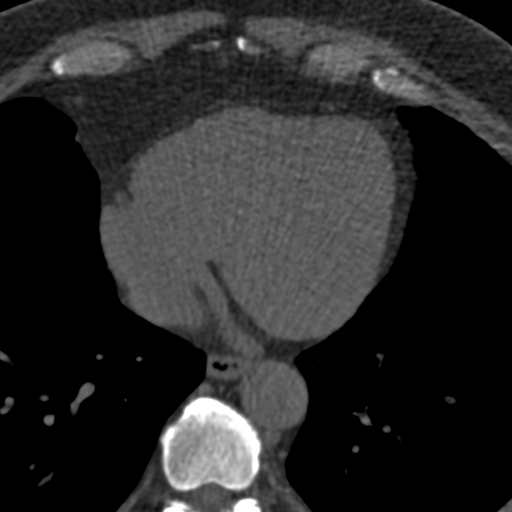
[im 34/56  vessel]
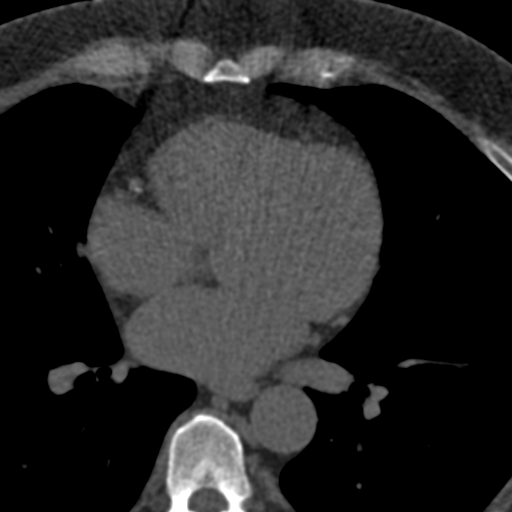
[im 45/56  vessel]
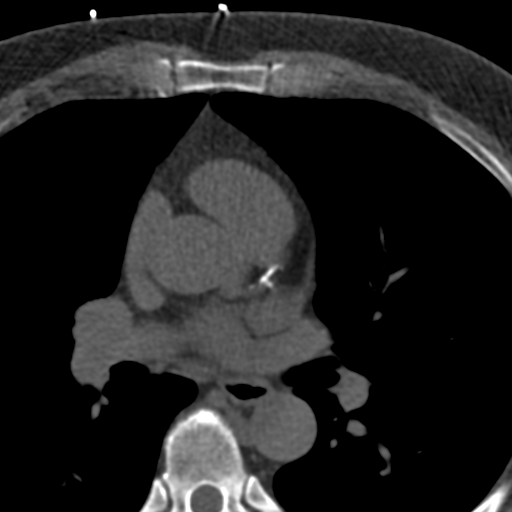

[Series 5: full fov st calcium scoring 3.00 · axial · 0.81mm/px · z∈[-1150,-1042]mm · 5 of 56 slices shown, 7 images]
[im 10/56  vessel]
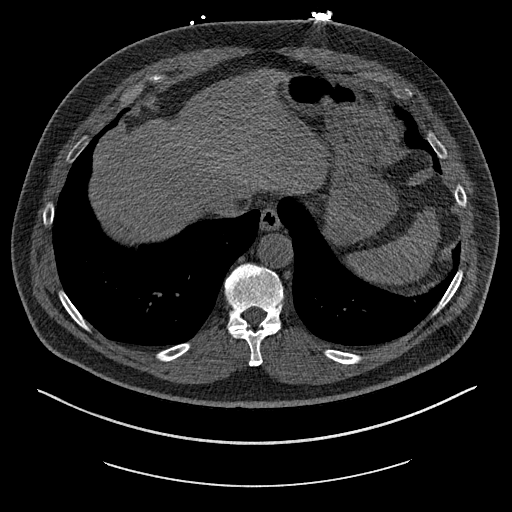
[im 10/56  lung]
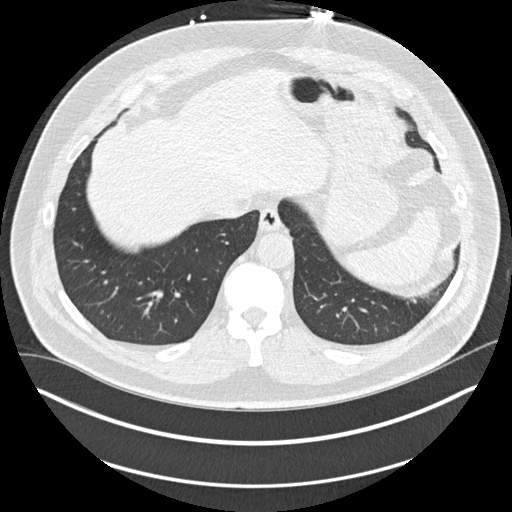
[im 19/56  vessel]
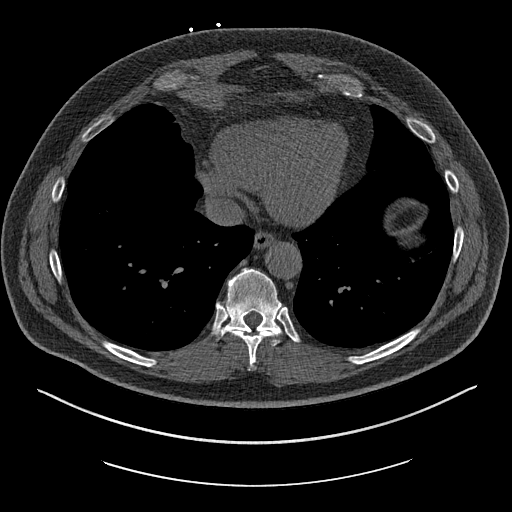
[im 28/56  vessel]
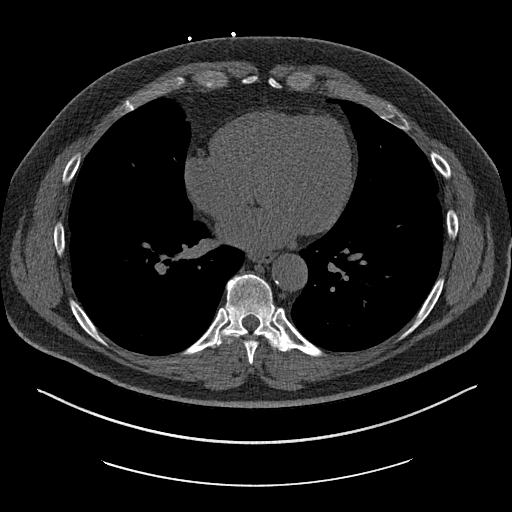
[im 37/56  vessel]
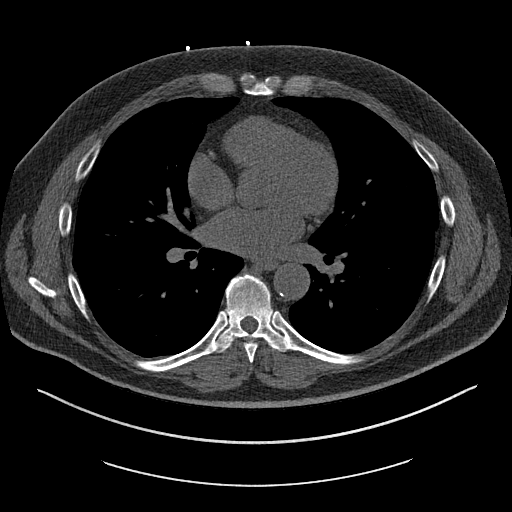
[im 46/56  vessel]
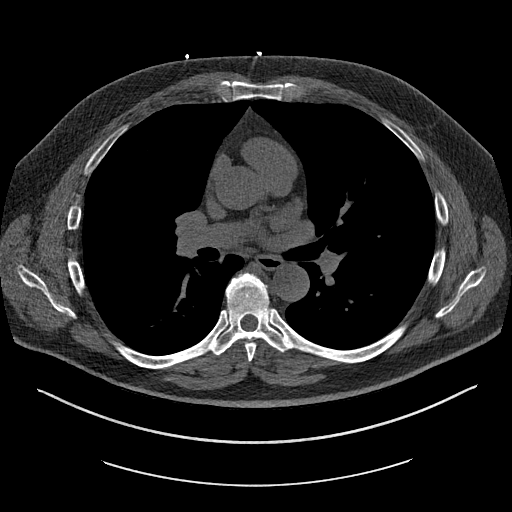
[im 46/56  lung]
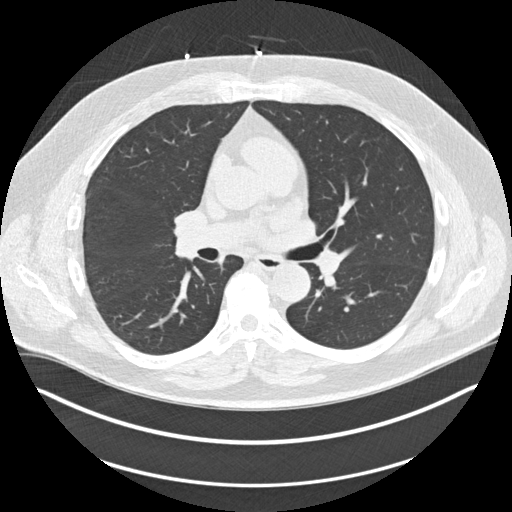

[Series 10: full fov lungs calcium scoring 3.00 ax · axial · 0.81mm/px · z∈[-1150,-1042]mm · 5 of 56 slices shown]
[im 10/56  vessel]
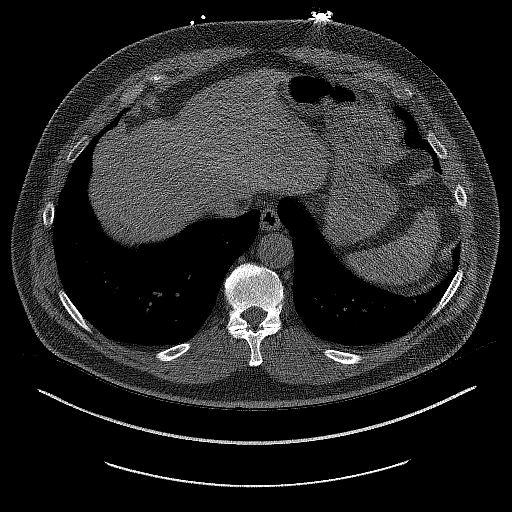
[im 19/56  vessel]
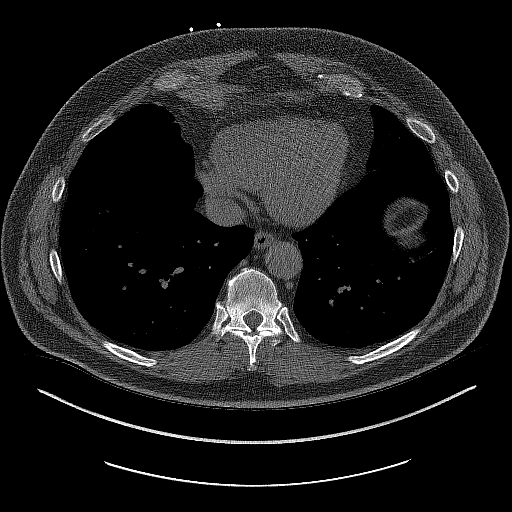
[im 28/56  vessel]
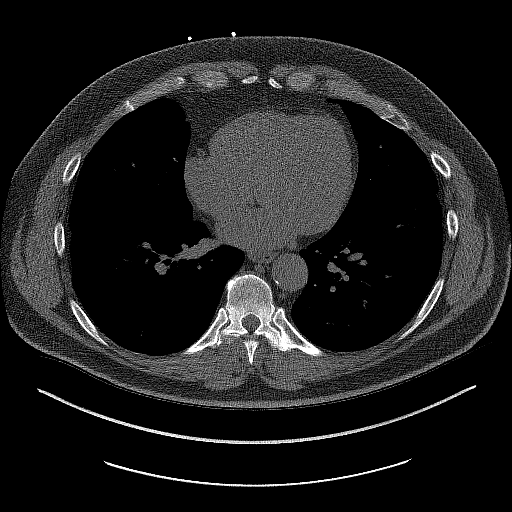
[im 37/56  vessel]
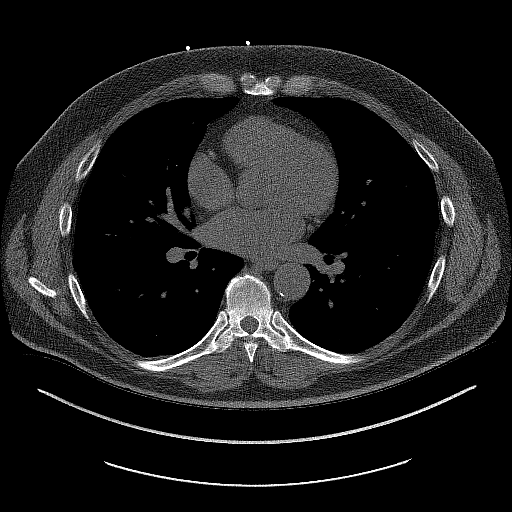
[im 46/56  vessel]
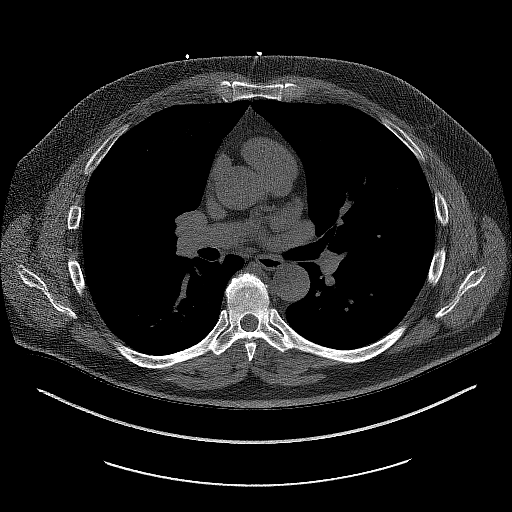

[14 of 20 positions shown; findings below may reference images not displayed]

FINDINGS: Aortic atherosclerosis. Within the visualized portions of the thorax
there are no suspicious appearing pulmonary nodules or masses, there
is no acute consolidative airspace disease, no pleural effusions, no
pneumothorax and no lymphadenopathy. Visualized portions of the
upper abdomen are unremarkable. There are no aggressive appearing
lytic or blastic lesions noted in the visualized portions of the
skeleton.
IMPRESSION: 1.  Aortic Atherosclerosis (BXDQT-1DV.V).
FINDINGS: Non-cardiac: See separate report from [REDACTED].

Ascending Aorta: Normal size

Pericardium: Normal

Coronary arteries: Normal origin of left and right coronary
arteries. Distribution of arterial calcifications if present, as
noted below;

LM 0

LAD 374

LCx 173

RCA 103

Total 649
IMPRESSION: 1. High Coronary calcium score of 649. This was 90th percentile for
age and sex matched control.

2.  Recommend asa and statin if no contraindications.

[employer]

*** End of Addendum ***
EXAM:
OVER-READ INTERPRETATION  CT CHEST

The following report is an over-read performed by radiologist Dr.
Ebk Vielma [REDACTED] on 12/22/2019. This
over-read does not include interpretation of cardiac or coronary
anatomy or pathology. The coronary calcium score interpretation by
the cardiologist is attached.
FINDINGS: Aortic atherosclerosis. Within the visualized portions of the thorax
there are no suspicious appearing pulmonary nodules or masses, there
is no acute consolidative airspace disease, no pleural effusions, no
pneumothorax and no lymphadenopathy. Visualized portions of the
upper abdomen are unremarkable. There are no aggressive appearing
lytic or blastic lesions noted in the visualized portions of the
skeleton.
IMPRESSION: 1.  Aortic Atherosclerosis (BXDQT-1DV.V).

## 2021-08-11 DIAGNOSIS — G4733 Obstructive sleep apnea (adult) (pediatric): Secondary | ICD-10-CM | POA: Diagnosis not present

## 2021-08-29 ENCOUNTER — Encounter: Payer: Self-pay | Admitting: Internal Medicine

## 2021-09-01 ENCOUNTER — Other Ambulatory Visit: Payer: Self-pay | Admitting: Internal Medicine

## 2021-09-01 MED ORDER — PREDNISONE 10 MG PO TABS: ORAL_TABLET | ORAL | 0 refills | Status: DC

## 2021-09-01 MED ORDER — MOLNUPIRAVIR EUA 200MG CAPSULE: 4.0000 | ORAL_CAPSULE | Freq: Two times a day (BID) | ORAL | 0 refills | Status: AC

## 2021-09-01 MED ORDER — ZOLPIDEM TARTRATE 10 MG PO TABS: 10.0000 mg | ORAL_TABLET | Freq: Every evening | ORAL | 0 refills | Status: DC | PRN

## 2021-09-01 MED ORDER — LEVOFLOXACIN 500 MG PO TABS: 500.0000 mg | ORAL_TABLET | Freq: Every day | ORAL | 0 refills | Status: AC

## 2021-09-13 DIAGNOSIS — M79672 Pain in left foot: Secondary | ICD-10-CM | POA: Diagnosis not present

## 2021-09-13 DIAGNOSIS — M76822 Posterior tibial tendinitis, left leg: Secondary | ICD-10-CM | POA: Diagnosis not present

## 2021-09-30 ENCOUNTER — Telehealth: Payer: BC Managed Care – PPO | Admitting: Family Medicine

## 2021-09-30 DIAGNOSIS — J019 Acute sinusitis, unspecified: Secondary | ICD-10-CM | POA: Diagnosis not present

## 2021-09-30 DIAGNOSIS — B9689 Other specified bacterial agents as the cause of diseases classified elsewhere: Secondary | ICD-10-CM

## 2021-09-30 MED ORDER — PREDNISONE 10 MG PO TABS: ORAL_TABLET | ORAL | 0 refills | Status: DC

## 2021-09-30 MED ORDER — AMOXICILLIN-POT CLAVULANATE 875-125 MG PO TABS: 1.0000 | ORAL_TABLET | Freq: Two times a day (BID) | ORAL | 0 refills | Status: DC

## 2021-09-30 NOTE — Progress Notes (Signed)
E-Visit for Sinus Problems  We are sorry that you are not feeling well.  Here is how we plan to help!  Based on what you have shared with me it looks like you have sinusitis.  Sinusitis is inflammation and infection in the sinus cavities of the head.  Based on your presentation I believe you most likely have Acute Bacterial Sinusitis.  This is an infection caused by bacteria and is treated with antibiotics. I have prescribed Augmentin '875mg'$ /'125mg'$  one tablet twice daily with food, for 7 days. You may use an oral decongestant such as Mucinex D or if you have glaucoma or high blood pressure use plain Mucinex. Saline nasal spray help and can safely be used as often as needed for congestion.  If you develop worsening sinus pain, fever or notice severe headache and vision changes, or if symptoms are not better after completion of antibiotic, please schedule an appointment with a health care provider.    I have provided 5 minutes of non face to face time during this encounter for chart review and documentation.   Sinus infections are not as easily transmitted as other respiratory infection, however we still recommend that you avoid close contact with loved ones, especially the very young and elderly.  Remember to wash your hands thoroughly throughout the day as this is the number one way to prevent the spread of infection!  Home Care: Only take medications as instructed by your medical team. Complete the entire course of an antibiotic. Do not take these medications with alcohol. A steam or ultrasonic humidifier can help congestion.  You can place a towel over your head and breathe in the steam from hot water coming from a faucet. Avoid close contacts especially the very young and the elderly. Cover your mouth when you cough or sneeze. Always remember to wash your hands.  Get Help Right Away If: You develop worsening fever or sinus pain. You develop a severe head ache or visual changes. Your symptoms  persist after you have completed your treatment plan.  Make sure you Understand these instructions. Will watch your condition. Will get help right away if you are not doing well or get worse.  Thank you for choosing an e-visit.  Your e-visit answers were reviewed by a board certified advanced clinical practitioner to complete your personal care plan. Depending upon the condition, your plan could have included both over the counter or prescription medications.  Please review your pharmacy choice. Make sure the pharmacy is open so you can pick up prescription now. If there is a problem, you may contact your provider through [employer] and have the prescription routed to another pharmacy.  Your safety is important to [place]. If you have drug allergies check your prescription carefully.   For the next 24 hours you can use MyChart to ask questions about today's visit, request a non-urgent call back, or ask for a work or school excuse. You will get an email in the next two days asking about your experience. I hope that your e-visit has been valuable and will speed your recovery.

## 2021-10-04 NOTE — Telephone Encounter (Signed)
Pt had a virtual visit on 09/30/2021.

## 2021-10-21 ENCOUNTER — Encounter: Payer: Self-pay | Admitting: Cardiology

## 2021-10-28 ENCOUNTER — Other Ambulatory Visit: Payer: Self-pay | Admitting: Internal Medicine

## 2021-11-02 ENCOUNTER — Other Ambulatory Visit: Payer: Self-pay | Admitting: Internal Medicine

## 2021-11-10 ENCOUNTER — Other Ambulatory Visit (INDEPENDENT_AMBULATORY_CARE_PROVIDER_SITE_OTHER): Payer: Medicare Other

## 2021-11-10 DIAGNOSIS — R5383 Other fatigue: Secondary | ICD-10-CM

## 2021-11-10 DIAGNOSIS — R7301 Impaired fasting glucose: Secondary | ICD-10-CM

## 2021-11-10 DIAGNOSIS — E782 Mixed hyperlipidemia: Secondary | ICD-10-CM | POA: Diagnosis not present

## 2021-11-10 DIAGNOSIS — I10 Essential (primary) hypertension: Secondary | ICD-10-CM

## 2021-11-10 LAB — CBC WITH DIFFERENTIAL/PLATELET
Basophils Absolute: 0.1 10*3/uL (ref 0.0–0.1)
Basophils Relative: 1.1 % (ref 0.0–3.0)
Eosinophils Absolute: 0.3 10*3/uL (ref 0.0–0.7)
Eosinophils Relative: 5.2 % — ABNORMAL HIGH (ref 0.0–5.0)
HCT: 42 % (ref 39.0–52.0)
Hemoglobin: 14.7 g/dL (ref 13.0–17.0)
Lymphocytes Relative: 34.3 % (ref 12.0–46.0)
Lymphs Abs: 2.2 10*3/uL (ref 0.7–4.0)
MCHC: 35.1 g/dL (ref 30.0–36.0)
MCV: 92.5 fl (ref 78.0–100.0)
Monocytes Absolute: 0.7 10*3/uL (ref 0.1–1.0)
Monocytes Relative: 10.3 % (ref 3.0–12.0)
Neutro Abs: 3.1 10*3/uL (ref 1.4–7.7)
Neutrophils Relative %: 49.1 % (ref 43.0–77.0)
Platelets: 159 10*3/uL (ref 150.0–400.0)
RBC: 4.54 Mil/uL (ref 4.22–5.81)
RDW: 13.9 % (ref 11.5–15.5)
WBC: 6.4 10*3/uL (ref 4.0–10.5)

## 2021-11-10 LAB — LIPID PANEL
Cholesterol: 143 mg/dL (ref 0–200)
HDL: 53.7 mg/dL (ref >39.00)
LDL Cholesterol: 69 mg/dL (ref 0–99)
NonHDL: 89.16
Total CHOL/HDL Ratio: 3
Triglycerides: 103 mg/dL (ref 0.0–149.0)
VLDL: 20.6 mg/dL (ref 0.0–40.0)

## 2021-11-10 LAB — COMPREHENSIVE METABOLIC PANEL
ALT: 23 U/L (ref 0–53)
AST: 35 U/L (ref 0–37)
Albumin: 4.1 g/dL (ref 3.5–5.2)
Alkaline Phosphatase: 64 U/L (ref 39–117)
BUN: 24 mg/dL — ABNORMAL HIGH (ref 6–23)
CO2: 25 mEq/L (ref 19–32)
Calcium: 9.3 mg/dL (ref 8.4–10.5)
Chloride: 106 mEq/L (ref 96–112)
Creatinine, Ser: 1.04 mg/dL (ref 0.40–1.50)
GFR: 75.59 mL/min (ref >60.00)
Glucose, Bld: 95 mg/dL (ref 70–99)
Potassium: 3.5 mEq/L (ref 3.5–5.1)
Sodium: 141 mEq/L (ref 135–145)
Total Bilirubin: 0.6 mg/dL (ref 0.2–1.2)
Total Protein: 6.4 g/dL (ref 6.0–8.3)

## 2021-11-10 LAB — TSH: TSH: 2.59 u[IU]/mL (ref 0.35–5.50)

## 2021-11-10 LAB — MICROALBUMIN / CREATININE URINE RATIO
Creatinine,U: 266.6 mg/dL
Microalb Creat Ratio: 0.8 mg/g (ref 0.0–30.0)
Microalb, Ur: 2 mg/dL — ABNORMAL HIGH (ref 0.0–1.9)

## 2021-11-10 LAB — HEMOGLOBIN A1C: Hgb A1c MFr Bld: 5.8 % (ref 4.6–6.5)

## 2021-11-11 DIAGNOSIS — M67962 Unspecified disorder of synovium and tendon, left lower leg: Secondary | ICD-10-CM | POA: Diagnosis not present

## 2021-11-16 ENCOUNTER — Encounter: Payer: Self-pay | Admitting: Internal Medicine

## 2021-11-16 ENCOUNTER — Ambulatory Visit (INDEPENDENT_AMBULATORY_CARE_PROVIDER_SITE_OTHER): Payer: Medicare Other | Admitting: Internal Medicine

## 2021-11-16 VITALS — BP 138/84 | HR 66 | Temp 97.6°F | Ht 73.0 in | Wt 245.4 lb

## 2021-11-16 DIAGNOSIS — Z0000 Encounter for general adult medical examination without abnormal findings: Secondary | ICD-10-CM | POA: Diagnosis not present

## 2021-11-16 DIAGNOSIS — Z125 Encounter for screening for malignant neoplasm of prostate: Secondary | ICD-10-CM

## 2021-11-16 DIAGNOSIS — Z23 Encounter for immunization: Secondary | ICD-10-CM | POA: Diagnosis not present

## 2021-11-16 DIAGNOSIS — G4733 Obstructive sleep apnea (adult) (pediatric): Secondary | ICD-10-CM

## 2021-11-16 DIAGNOSIS — I10 Essential (primary) hypertension: Secondary | ICD-10-CM | POA: Diagnosis not present

## 2021-11-16 DIAGNOSIS — E782 Mixed hyperlipidemia: Secondary | ICD-10-CM

## 2021-11-16 DIAGNOSIS — E669 Obesity, unspecified: Secondary | ICD-10-CM

## 2021-11-16 DIAGNOSIS — R972 Elevated prostate specific antigen [PSA]: Secondary | ICD-10-CM

## 2021-11-16 LAB — PSA, MEDICARE: PSA: 3.06 ng/ml (ref 0.10–4.00)

## 2021-11-16 MED ORDER — AMLODIPINE BESYLATE 2.5 MG PO TABS: 2.5000 mg | ORAL_TABLET | Freq: Every day | ORAL | 1 refills | Status: DC

## 2021-11-16 MED ORDER — ZOLPIDEM TARTRATE 10 MG PO TABS: 10.0000 mg | ORAL_TABLET | Freq: Every evening | ORAL | 5 refills | Status: DC | PRN

## 2021-11-16 MED ORDER — ATORVASTATIN CALCIUM 80 MG PO TABS: 80.0000 mg | ORAL_TABLET | Freq: Every day | ORAL | 1 refills | Status: DC

## 2021-11-16 NOTE — Assessment & Plan Note (Signed)

## 2021-11-16 NOTE — Patient Instructions (Addendum)
Adding 2.5 mg dose of amlpodipine once daily in the morning for blood pressure.  DO NOT START YET.  THE ELEVATION IN BLOOD PRESSURE MAY BE DUE TO THE CELEBREX    Please schedule an RN visit after you have stopped the celebrex.  Bring your home BP machine so we can compare readings  IF THE READINGS ARE STILL >130/80,  WE WILL ADD AMLODIPINE

## 2021-11-16 NOTE — Progress Notes (Unsigned)
The patient is here for the Welcome to  Medicare  preventive visit     has a past medical history of Hyperlipidemia, Hypertension, and Sleep apnea.    reports that he quit smoking about 28 years ago. His smoking use included cigarettes. He has never used smokeless tobacco. He reports current alcohol use of about 4.0 standard drinks of alcohol per week. He reports that he does not use drugs.   The roster of all physicians providing medical care to patient : Patient Care Team: Crecencio Mc, MD as PCP - General (Internal Medicine) Kate Sable, MD as PCP - Cardiology (Cardiology) Christene Lye, MD (General Surgery) Crecencio Mc, MD (Internal Medicine)  Activities of daily living:  The patient is 100% independent in all ADLs: dressing, toileting, feeding as well as independent mobility Fall risk was assessed by direct patient evaluation of patient's balance, gait and ability to risk from a chair and from a kneeling position. Home safety : The patient has smoke detectors in the home. They wear seatbelts.  There are no firearms at home. There is no violence in the home.  Patient has seen their eye doctor in the last year.   Visual acuity was assessed today  and was 20/20 with correction lenses. Patient denies hearing difficulty with regard to whispered voices and some television programs and has  deferred audiologic testing in the last year.   There is no risks for hepatitis, STDs or HIV. There is no   history of blood transfusion. They have no travel history to infectious disease endemic areas of the world.  The patient has seen their dentist in the last six month.  They do not  have excessive sun exposure. Discussed the need for sun protection: hats, long sleeves and use of sunscreen if there is significant sun exposure.   Diet: the importance of a healthy diet is discussed. They do have a healthy diet   The benefits of regular aerobic exercise were discussed. 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   Depression screen:      11/16/2021    9:05 AM 11/16/2021    8:58 AM 11/18/2020    8:18 AM 06/23/2020    4:16 PM 01/08/2019    8:33 AM  Depression screen PHQ 2/9  Decreased Interest 0 0 0 0 0  Down, Depressed, Hopeless 0 0 0 0 0  PHQ - 2 Score 0 0 0 0 0  Altered sleeping 0      Tired, decreased energy 0      Change in appetite 0      Feeling bad or failure about yourself  0      Trouble concentrating 0      Moving slowly or fidgety/restless 0      Suicidal thoughts 0      PHQ-9 Score 0      Difficult doing work/chores Not difficult at all           Cognitive assessment: the patient manages all their financial and personal affairs and is actively engaged. They could relate day,date,year and events; recalled 2/3 objects at 3 minutes; performed clock-face test normally.  The following portions of the patient's history were reviewed and updated as appropriate: allergies, current medications, past family history, past medical history,  past surgical  history, past social history  and problem list.  During the course of the visit the patient was educated and counseled about appropriate screening and preventive services including : fall prevention , diabetes screening, nutrition counseling, colorectal cancer screening, and recommended immunizations   Immunization History  Administered Date(s) Administered   Influenza,inj,Quad PF,6+ Mos 12/16/2012, 01/22/2014, 03/20/2016, 12/12/2017, 01/27/2021   Moderna SARS-COV2 Booster Vaccination 11/06/2019   PFIZER(Purple Top)SARS-COV-2 Vaccination 05/09/2019, 06/03/2019   Tdap 01/22/2014  HMLISTPATIENTFRIENDLY@ Health Maintenance Due  Topic Date Due   HIV Screening  Never done   COVID-19 Vaccine (3 - Pfizer series) 01/01/2020   INFLUENZA VACCINE  09/13/2021    Pneumonia Vaccine 53+ Years old (1 - PCV) Never done    Last skin cancer screening : April 2023 [facility]  .  Every 6 months     Vital Signs: BP 138/84 (BP Location: Left Arm, Patient Position: Sitting, Cuff Size: Normal)   Pulse 66   Temp 97.6 F (36.4 C) (Oral)   Ht '6\' 1"'$  (1.854 m)   Wt 245 lb 6.4 oz (111.3 kg)   SpO2 98%   BMI 32.38 kg/m    Exam: General appearance: alert, cooperative and appears stated age Head: Normocephalic, without obvious abnormality, atraumatic Eyes: conjunctivae/corneas clear. PERRL, EOM's intact. Fundi benign. Ears: normal TM's and external ear canals both ears Nose: Nares normal. Septum midline. Mucosa normal. No drainage or sinus tenderness. Throat: lips, mucosa, and tongue normal; teeth and gums normal Neck: no adenopathy, no carotid bruit, no JVD, supple, symmetrical, trachea midline and thyroid not enlarged, symmetric, no tenderness/mass/nodules Lungs: clear to auscultation bilaterally Breasts: normal appearance, no masses or tenderness Heart: regular rate and rhythm, S1, S2 normal, no murmur, click, rub or gallop Abdomen: soft, non-tender; bowel sounds normal; no masses,  no organomegaly Extremities: extremities normal, atraumatic, no cyanosis or edema Pulses: 2+ and symmetric Skin: Skin color, texture, turgor normal. No rashes or lesions Neurologic: Alert and oriented X 3, normal strength and tone. Normal symmetric reflexes. Normal coordination and gait.     End of Life Discussion and Planning   During the course of the visit , End of Life objectives were discussed at length.  Patient does not have a living will in place or a healthcare power of attorney.  Patient  was given printed information about advance directives and encouraged to return after discussing with their family.  Review of Opioid Prescriptions    Patient does not have a current opioid prescription Patient's risk factors for opioid use disorder was reviewed and  includes nothing  Treatment of pain using non-opioid alternatives was reviewed  and encouraged   Patient risk for potential substance abuse was assessed and addressed with counselling.

## 2021-11-17 NOTE — Assessment & Plan Note (Signed)
Repeat PSA's x 2 by Urology [name] have normalized.  Next PSA is due in Jan 2024  Lab Results  Component Value Date   PSA1 3.0 02/22/2021   PSA1 6.5 (H) 11/23/2020   PSA 3.06 11/16/2021   PSA 9.51 (H) 11/15/2020   PSA 2.34 11/12/2019

## 2021-11-17 NOTE — Assessment & Plan Note (Signed)
I have addressed  BMI and recommended a low glycemic index diet utilizing smaller more frequent meals to increase metabolism.  I have also recommended that patient start exercising with a goal of 30 minutes of aerobic exercise a minimum of 5 days per week. Screening for lipid disorders, thyroid and diabetes to be done today.   

## 2021-11-17 NOTE — Assessment & Plan Note (Signed)
Well controlled on current 3 drug regimen. Of anmlodipine  telmisartan and hctz.   Renal function stable, no changes today.  Lab Results  Component Value Date   CREATININE 1.04 11/10/2021   Lab Results  Component Value Date   NA 141 11/10/2021   K 3.5 11/10/2021   CL 106 11/10/2021   CO2 25 11/10/2021

## 2021-11-17 NOTE — Assessment & Plan Note (Signed)
Diagnosed by sleep study. he is wearing her CPAP every night a minimum of 6 hours per night and notes improved daytime wakefulness and decreased fatigue  . New mask is needed.

## 2021-11-17 NOTE — Assessment & Plan Note (Signed)
At goal on atorvastatin 80 mg . No changes today  Lab Results  Component Value Date   CHOL 143 11/10/2021   HDL 53.70 11/10/2021   LDLCALC 69 11/10/2021   LDLDIRECT 105.0 07/24/2018   TRIG 103.0 11/10/2021   CHOLHDL 3 11/10/2021

## 2021-12-12 DIAGNOSIS — M79672 Pain in left foot: Secondary | ICD-10-CM | POA: Diagnosis not present

## 2021-12-14 DIAGNOSIS — M79672 Pain in left foot: Secondary | ICD-10-CM | POA: Diagnosis not present

## 2021-12-19 DIAGNOSIS — S93432A Sprain of tibiofibular ligament of left ankle, initial encounter: Secondary | ICD-10-CM | POA: Diagnosis not present

## 2021-12-22 DIAGNOSIS — M76822 Posterior tibial tendinitis, left leg: Secondary | ICD-10-CM | POA: Diagnosis not present

## 2021-12-24 ENCOUNTER — Encounter: Payer: Self-pay | Admitting: Internal Medicine

## 2022-01-01 DIAGNOSIS — Z03818 Encounter for observation for suspected exposure to other biological agents ruled out: Secondary | ICD-10-CM | POA: Diagnosis not present

## 2022-01-01 DIAGNOSIS — J029 Acute pharyngitis, unspecified: Secondary | ICD-10-CM | POA: Diagnosis not present

## 2022-01-01 DIAGNOSIS — Z20822 Contact with and (suspected) exposure to covid-19: Secondary | ICD-10-CM | POA: Diagnosis not present

## 2022-01-12 DIAGNOSIS — M79672 Pain in left foot: Secondary | ICD-10-CM | POA: Diagnosis not present

## 2022-01-22 ENCOUNTER — Other Ambulatory Visit: Payer: Self-pay | Admitting: Internal Medicine

## 2022-02-02 DIAGNOSIS — M79672 Pain in left foot: Secondary | ICD-10-CM | POA: Diagnosis not present

## 2022-02-02 DIAGNOSIS — M76822 Posterior tibial tendinitis, left leg: Secondary | ICD-10-CM | POA: Diagnosis not present

## 2022-02-14 DIAGNOSIS — M79672 Pain in left foot: Secondary | ICD-10-CM | POA: Diagnosis not present

## 2022-02-15 ENCOUNTER — Telehealth: Payer: Self-pay | Admitting: Internal Medicine

## 2022-02-15 DIAGNOSIS — G4733 Obstructive sleep apnea (adult) (pediatric): Secondary | ICD-10-CM

## 2022-02-15 NOTE — Telephone Encounter (Signed)
[facility] called staying that Dr.Tullo ordered a C-pap and they need the settings added to the order, 5-20. Any question or concern reach at [phone]

## 2022-02-16 NOTE — Telephone Encounter (Signed)
Corrected and placed in quick sign folder.

## 2022-02-16 NOTE — Telephone Encounter (Signed)
Spoke with Melissa at [facility] and got he fax number to fax the order to. Order has been faxed. Fax # [phone]

## 2022-02-16 NOTE — Telephone Encounter (Signed)
DME for CPAP autotitrating (5-20) printed and signed

## 2022-03-27 DIAGNOSIS — G4733 Obstructive sleep apnea (adult) (pediatric): Secondary | ICD-10-CM | POA: Diagnosis not present

## 2022-04-04 DIAGNOSIS — J029 Acute pharyngitis, unspecified: Secondary | ICD-10-CM | POA: Diagnosis not present

## 2022-04-04 DIAGNOSIS — Z03818 Encounter for observation for suspected exposure to other biological agents ruled out: Secondary | ICD-10-CM | POA: Diagnosis not present

## 2022-04-04 DIAGNOSIS — Z20822 Contact with and (suspected) exposure to covid-19: Secondary | ICD-10-CM | POA: Diagnosis not present

## 2022-04-11 DIAGNOSIS — G4733 Obstructive sleep apnea (adult) (pediatric): Secondary | ICD-10-CM | POA: Diagnosis not present

## 2022-04-24 ENCOUNTER — Other Ambulatory Visit: Payer: Self-pay | Admitting: Internal Medicine

## 2022-04-25 DIAGNOSIS — G4733 Obstructive sleep apnea (adult) (pediatric): Secondary | ICD-10-CM | POA: Diagnosis not present

## 2022-05-16 ENCOUNTER — Encounter: Payer: Self-pay | Admitting: Internal Medicine

## 2022-05-16 ENCOUNTER — Other Ambulatory Visit: Payer: Self-pay | Admitting: Internal Medicine

## 2022-05-16 DIAGNOSIS — E782 Mixed hyperlipidemia: Secondary | ICD-10-CM

## 2022-05-16 DIAGNOSIS — I10 Essential (primary) hypertension: Secondary | ICD-10-CM

## 2022-05-17 ENCOUNTER — Other Ambulatory Visit: Payer: Self-pay | Admitting: Internal Medicine

## 2022-05-17 MED ORDER — ZOLPIDEM TARTRATE 10 MG PO TABS: 10.0000 mg | ORAL_TABLET | Freq: Every evening | ORAL | 5 refills | Status: DC | PRN

## 2022-05-19 MED ORDER — TELMISARTAN 80 MG PO TABS: 80.0000 mg | ORAL_TABLET | Freq: Every day | ORAL | 0 refills | Status: DC

## 2022-05-23 NOTE — Addendum Note (Signed)
Addended by: Sandy Salaam on: 05/23/2022 02:57 PM   Modules accepted: Orders

## 2022-05-25 ENCOUNTER — Other Ambulatory Visit (INDEPENDENT_AMBULATORY_CARE_PROVIDER_SITE_OTHER): Payer: Medicare Other

## 2022-05-25 DIAGNOSIS — I10 Essential (primary) hypertension: Secondary | ICD-10-CM

## 2022-05-25 DIAGNOSIS — E782 Mixed hyperlipidemia: Secondary | ICD-10-CM | POA: Diagnosis not present

## 2022-05-25 LAB — COMPREHENSIVE METABOLIC PANEL
ALT: 33 U/L (ref 0–53)
AST: 41 U/L — ABNORMAL HIGH (ref 0–37)
Albumin: 4.3 g/dL (ref 3.5–5.2)
Alkaline Phosphatase: 78 U/L (ref 39–117)
BUN: 21 mg/dL (ref 6–23)
CO2: 30 mEq/L (ref 19–32)
Calcium: 9.3 mg/dL (ref 8.4–10.5)
Chloride: 104 mEq/L (ref 96–112)
Creatinine, Ser: 1.09 mg/dL (ref 0.40–1.50)
GFR: 71.18 mL/min (ref >60.00)
Glucose, Bld: 101 mg/dL — ABNORMAL HIGH (ref 70–99)
Potassium: 3.7 mEq/L (ref 3.5–5.1)
Sodium: 140 mEq/L (ref 135–145)
Total Bilirubin: 0.8 mg/dL (ref 0.2–1.2)
Total Protein: 6.8 g/dL (ref 6.0–8.3)

## 2022-05-25 LAB — LDL CHOLESTEROL, DIRECT: Direct LDL: 90 mg/dL

## 2022-05-25 LAB — MICROALBUMIN / CREATININE URINE RATIO
Creatinine,U: 30.3 mg/dL
Microalb Creat Ratio: 2.3 mg/g (ref 0.0–30.0)
Microalb, Ur: <0.7 mg/dL (ref 0.0–1.9)

## 2022-05-25 LAB — LIPID PANEL
Cholesterol: 162 mg/dL (ref 0–200)
HDL: 52.6 mg/dL (ref >39.00)
LDL Cholesterol: 86 mg/dL (ref 0–99)
NonHDL: 109.47
Total CHOL/HDL Ratio: 3
Triglycerides: 115 mg/dL (ref 0.0–149.0)
VLDL: 23 mg/dL (ref 0.0–40.0)

## 2022-05-26 DIAGNOSIS — G4733 Obstructive sleep apnea (adult) (pediatric): Secondary | ICD-10-CM | POA: Diagnosis not present

## 2022-06-12 ENCOUNTER — Ambulatory Visit (INDEPENDENT_AMBULATORY_CARE_PROVIDER_SITE_OTHER): Payer: Medicare Other | Admitting: Internal Medicine

## 2022-06-12 ENCOUNTER — Encounter: Payer: Self-pay | Admitting: Internal Medicine

## 2022-06-12 VITALS — BP 140/80 | HR 65 | Temp 98.0°F | Ht 73.0 in | Wt 242.4 lb

## 2022-06-12 DIAGNOSIS — Z125 Encounter for screening for malignant neoplasm of prostate: Secondary | ICD-10-CM

## 2022-06-12 DIAGNOSIS — G4700 Insomnia, unspecified: Secondary | ICD-10-CM

## 2022-06-12 DIAGNOSIS — E782 Mixed hyperlipidemia: Secondary | ICD-10-CM

## 2022-06-12 DIAGNOSIS — R5383 Other fatigue: Secondary | ICD-10-CM

## 2022-06-12 DIAGNOSIS — I10 Essential (primary) hypertension: Secondary | ICD-10-CM | POA: Diagnosis not present

## 2022-06-12 DIAGNOSIS — R7301 Impaired fasting glucose: Secondary | ICD-10-CM | POA: Diagnosis not present

## 2022-06-12 DIAGNOSIS — K760 Fatty (change of) liver, not elsewhere classified: Secondary | ICD-10-CM | POA: Diagnosis not present

## 2022-06-12 DIAGNOSIS — G4733 Obstructive sleep apnea (adult) (pediatric): Secondary | ICD-10-CM

## 2022-06-12 DIAGNOSIS — G4730 Sleep apnea, unspecified: Secondary | ICD-10-CM

## 2022-06-12 MED ORDER — TELMISARTAN 80 MG PO TABS: 80.0000 mg | ORAL_TABLET | Freq: Every day | ORAL | 1 refills | Status: DC

## 2022-06-12 MED ORDER — ATORVASTATIN CALCIUM 80 MG PO TABS: 80.0000 mg | ORAL_TABLET | Freq: Every day | ORAL | 1 refills | Status: DC

## 2022-06-12 MED ORDER — HYDROCHLOROTHIAZIDE 25 MG PO TABS: 25.0000 mg | ORAL_TABLET | Freq: Every day | ORAL | 1 refills | Status: DC

## 2022-06-12 MED ORDER — AMLODIPINE BESYLATE 2.5 MG PO TABS: 2.5000 mg | ORAL_TABLET | Freq: Every day | ORAL | 1 refills | Status: DC

## 2022-06-12 NOTE — Patient Instructions (Addendum)
Increase amlodipine dose to 5 mg daily.  Continue telmisartan and Hctz  well  Please schedule an RN visit  in one month to check home machine against our machines .  We will also need to recheck your liver enzymes   Check the sodium content of the hdyration powder

## 2022-06-12 NOTE — Progress Notes (Unsigned)
Subjective:  Patient ID: Walter Osborne, male    DOB: July 22, 1955  Age: 67 y.o. MRN: [id number]  CC: The primary encounter diagnosis was Primary hypertension. Diagnoses of Mixed hyperlipidemia, Prostate cancer screening, Fatigue, unspecified type, and Impaired fasting glucose were also pertinent to this visit.   HPI Walter Osborne presents for  Chief Complaint  Patient presents with   Medical Management of Chronic Issues   1) HTN:   home readings on several machines are higher.    2) H/O TORN TENDON on dorsal surface of  LEFT FOOT. Wore a boot for 8 weeks.  No exercise     hurting again now that he has  resumed treadmill  and pickle ball  3)  Outpatient Medications Prior to Visit  Medication Sig Dispense Refill   amLODipine (NORVASC) 2.5 MG tablet Take 1 tablet (2.5 mg total) by mouth daily. 90 tablet 1   aspirin EC 81 MG tablet Take 1 tablet (81 mg total) by mouth daily. Swallow whole. 90 tablet 3   atorvastatin (LIPITOR) 80 MG tablet Take 1 tablet (80 mg total) by mouth daily. 90 tablet 1   colchicine 0.6 MG tablet Take 2 tabs immediately, then 1 tab twice per day for the duration of the flare up to a max of 7 9 tablet 0   fluticasone (FLONASE) 50 MCG/ACT nasal spray USE 2 SPRAYS EACH NOSTRIL DAILY 16 g 1   hydrochlorothiazide (HYDRODIURIL) 25 MG tablet Take 1 tablet by mouth once daily 90 tablet 0   Loratadine (ALAVERT PO) Take by mouth. 1 dissolvable tablet as needed daily for allergies.      sildenafil (REVATIO) 20 MG tablet Take 1 tablet (20 mg total) by mouth 3 (three) times daily. 30 tablet 11   telmisartan (MICARDIS) 80 MG tablet Take 1 tablet (80 mg total) by mouth daily. 90 tablet 0   zolpidem (AMBIEN) 10 MG tablet Take 1 tablet (10 mg total) by mouth at bedtime as needed. for sleep 30 tablet 5   Hepatitis A-Hep B Recomb Vac (TWINRIX) 720-20 ELU-MCG/ML injection REPEAT AFTER 1 AND 6 MONTHS (Patient not taking: Reported on 06/12/2022) 1 mL 2   predniSONE  (DELTASONE) 10 MG tablet 6 tablets on Day 1 , then reduce by 1 tablet daily until gone (Patient not taking: Reported on 06/12/2022) 21 tablet 0   No facility-administered medications prior to visit.    Review of Systems;  Patient denies headache, fevers, malaise, unintentional weight loss, skin rash, eye pain, sinus congestion and sinus pain, sore throat, dysphagia,  hemoptysis , cough, dyspnea, wheezing, chest pain, palpitations, orthopnea, edema, abdominal pain, nausea, melena, diarrhea, constipation, flank pain, dysuria, hematuria, urinary  Frequency, nocturia, numbness, tingling, seizures,  Focal weakness, Loss of consciousness,  Tremor, insomnia, depression, anxiety, and suicidal ideation.      Objective:  BP 136/64   Pulse 65   Temp 98 F (36.7 C) (Oral)   Ht 6\' 1"  (1.854 m)   Wt 242 lb 6.4 oz (110 kg)   SpO2 98%   BMI 31.98 kg/m   BP Readings from Last 3 Encounters:  06/12/22 136/64  11/16/21 138/84  05/19/21 132/78    Wt Readings from Last 3 Encounters:  06/12/22 242 lb 6.4 oz (110 kg)  11/16/21 245 lb 6.4 oz (111.3 kg)  05/19/21 237 lb 6.4 oz (107.7 kg)    Physical Exam  Lab Results  Component Value Date   HGBA1C 5.8 11/10/2021   HGBA1C 5.6 05/16/2021   HGBA1C  5.7 11/15/2020    Lab Results  Component Value Date   CREATININE 1.09 05/25/2022   CREATININE 1.04 11/10/2021   CREATININE 0.98 05/16/2021    Lab Results  Component Value Date   WBC 6.4 11/10/2021   HGB 14.7 11/10/2021   HCT 42.0 11/10/2021   PLT 159.0 11/10/2021   GLUCOSE 101 (H) 05/25/2022   CHOL 162 05/25/2022   TRIG 115.0 05/25/2022   HDL 52.60 05/25/2022   LDLDIRECT 90.0 05/25/2022   LDLCALC 86 05/25/2022   ALT 33 05/25/2022   AST 41 (H) 05/25/2022   NA 140 05/25/2022   K 3.7 05/25/2022   CL 104 05/25/2022   CREATININE 1.09 05/25/2022   BUN 21 05/25/2022   CO2 30 05/25/2022   TSH 2.59 11/10/2021   PSA 3.06 11/16/2021   HGBA1C 5.8 11/10/2021   MICROALBUR <0.7 05/25/2022     US Abdomen Limited RUQ (LIVER/GB)  Result Date: 12/30/2019 CLINICAL DATA:  Elevated AST EXAM: ULTRASOUND ABDOMEN LIMITED RIGHT UPPER QUADRANT COMPARISON:  None. FINDINGS: Gallbladder: No gallstones or wall thickening visualized. No sonographic Murphy sign noted by sonographer. Common bile duct: Diameter: 4.2 mm Liver: Increased echogenicity liver diffusely without focal liver lesion. Portal vein is patent on color Doppler imaging with normal direction of blood flow towards the liver. Other: Negative for ascites IMPRESSION: Negative for gallstones Echogenic liver suggesting fatty infiltration. Electronically Signed   By: Marlan Palau M.D.   On: 12/30/2019 15:05    Assessment & Plan:  .Primary hypertension  Mixed hyperlipidemia  Prostate cancer screening  Fatigue, unspecified type  Impaired fasting glucose     I provided 30 minutes of face-to-face time during this encounter reviewing patient's last visit with me, patient's  most recent visit with cardiology,  nephrology,  and neurology,  recent surgical and non surgical procedures, previous  labs and imaging studies, counseling on currently addressed issues,  and post visit ordering to diagnostics and therapeutics .   Follow-up: No follow-ups on file.   Sherlene Shams, MD

## 2022-06-13 MED ORDER — AMLODIPINE BESYLATE 5 MG PO TABS: 5.0000 mg | ORAL_TABLET | Freq: Every day | ORAL | 1 refills | Status: DC

## 2022-06-13 NOTE — Assessment & Plan Note (Signed)
Diagnosed by sleep study. he is wearing his CPAP every night a minimum of 6 hours per night and notes improved daytime wakefulness and decreased fatigue  

## 2022-06-13 NOTE — Assessment & Plan Note (Addendum)
Managed with hctz  telmisartan and amlodipine BUT NOT at goal.  Advised to increase amlodipine to 5 mg daily

## 2022-06-13 NOTE — Assessment & Plan Note (Signed)
Managed with 10 mg zolpidem.  Warned patient that dose will be reduced to  5 mg when he turns 66 

## 2022-06-13 NOTE — Assessment & Plan Note (Signed)
Presumed by ultrasound changes and serologies negative for autoimmune causes of hepatitis in November 2021.  He was offered referral to liver specialist at time of workup but declined.  He has been following a low GI diet  Reducing alcohol use, , exercising and taking meds  But did not take the prescribed metformin. .AST is slightly elevated   Lab Results  Component Value Date   ALT 33 05/25/2022   AST 41 (H) 05/25/2022   ALKPHOS 78 05/25/2022   BILITOT 0.8 05/25/2022

## 2022-06-14 ENCOUNTER — Encounter: Payer: Self-pay | Admitting: Internal Medicine

## 2022-06-16 MED ORDER — COLCHICINE 0.6 MG PO TABS: ORAL_TABLET | ORAL | 0 refills | Status: DC

## 2022-06-22 DIAGNOSIS — G4733 Obstructive sleep apnea (adult) (pediatric): Secondary | ICD-10-CM | POA: Diagnosis not present

## 2022-06-25 DIAGNOSIS — G4733 Obstructive sleep apnea (adult) (pediatric): Secondary | ICD-10-CM | POA: Diagnosis not present

## 2022-06-27 DIAGNOSIS — G4733 Obstructive sleep apnea (adult) (pediatric): Secondary | ICD-10-CM | POA: Diagnosis not present

## 2022-07-12 ENCOUNTER — Other Ambulatory Visit (INDEPENDENT_AMBULATORY_CARE_PROVIDER_SITE_OTHER): Payer: Medicare Other

## 2022-07-12 DIAGNOSIS — K760 Fatty (change of) liver, not elsewhere classified: Secondary | ICD-10-CM | POA: Diagnosis not present

## 2022-07-12 LAB — COMPREHENSIVE METABOLIC PANEL
ALT: 25 U/L (ref 0–53)
AST: 31 U/L (ref 0–37)
Albumin: 3.8 g/dL (ref 3.5–5.2)
Alkaline Phosphatase: 66 U/L (ref 39–117)
BUN: 19 mg/dL (ref 6–23)
CO2: 28 mEq/L (ref 19–32)
Calcium: 8.9 mg/dL (ref 8.4–10.5)
Chloride: 105 mEq/L (ref 96–112)
Creatinine, Ser: 1.13 mg/dL (ref 0.40–1.50)
GFR: 68.1 mL/min (ref >60.00)
Glucose, Bld: 104 mg/dL — ABNORMAL HIGH (ref 70–99)
Potassium: 3.6 mEq/L (ref 3.5–5.1)
Sodium: 140 mEq/L (ref 135–145)
Total Bilirubin: 0.5 mg/dL (ref 0.2–1.2)
Total Protein: 6.6 g/dL (ref 6.0–8.3)

## 2022-07-26 DIAGNOSIS — G4733 Obstructive sleep apnea (adult) (pediatric): Secondary | ICD-10-CM | POA: Diagnosis not present

## 2022-07-27 DIAGNOSIS — D2271 Melanocytic nevi of right lower limb, including hip: Secondary | ICD-10-CM | POA: Diagnosis not present

## 2022-07-27 DIAGNOSIS — D2261 Melanocytic nevi of right upper limb, including shoulder: Secondary | ICD-10-CM | POA: Diagnosis not present

## 2022-07-27 DIAGNOSIS — D2262 Melanocytic nevi of left upper limb, including shoulder: Secondary | ICD-10-CM | POA: Diagnosis not present

## 2022-07-27 DIAGNOSIS — Z08 Encounter for follow-up examination after completed treatment for malignant neoplasm: Secondary | ICD-10-CM | POA: Diagnosis not present

## 2022-07-27 DIAGNOSIS — D225 Melanocytic nevi of trunk: Secondary | ICD-10-CM | POA: Diagnosis not present

## 2022-07-27 DIAGNOSIS — Z85828 Personal history of other malignant neoplasm of skin: Secondary | ICD-10-CM | POA: Diagnosis not present

## 2022-07-27 DIAGNOSIS — D2272 Melanocytic nevi of left lower limb, including hip: Secondary | ICD-10-CM | POA: Diagnosis not present

## 2022-08-25 DIAGNOSIS — G4733 Obstructive sleep apnea (adult) (pediatric): Secondary | ICD-10-CM | POA: Diagnosis not present

## 2022-09-25 DIAGNOSIS — G4733 Obstructive sleep apnea (adult) (pediatric): Secondary | ICD-10-CM | POA: Diagnosis not present

## 2022-10-30 ENCOUNTER — Telehealth: Payer: Self-pay | Admitting: *Deleted

## 2022-10-30 ENCOUNTER — Telehealth: Payer: Self-pay

## 2022-10-30 ENCOUNTER — Encounter: Payer: Self-pay | Admitting: Internal Medicine

## 2022-10-30 ENCOUNTER — Other Ambulatory Visit: Payer: Self-pay | Admitting: *Deleted

## 2022-10-30 DIAGNOSIS — Z86010 Personal history of colonic polyps: Secondary | ICD-10-CM

## 2022-10-30 MED ORDER — PEG 3350-KCL-NABCB-NACL-NASULF 236 G PO SOLR: 4000.0000 mL | Freq: Once | ORAL | 0 refills | Status: AC

## 2022-10-30 NOTE — Telephone Encounter (Signed)
Colonoscopy schedule with Dr Tobi Bastos on 12/26/2022

## 2022-10-30 NOTE — Telephone Encounter (Signed)
Pt requesting call back received letter to call and schedule colonoscopy

## 2022-10-30 NOTE — Telephone Encounter (Signed)
Gastroenterology Pre-Procedure Review  Request Date: 12/26/2022 Requesting Physician: Dr. Tobi Bastos  PATIENT REVIEW QUESTIONS: The patient responded to the following health history questions as indicated:    1. Are you having any GI issues? no 2. Do you have a personal history of Polyps? yes (12/25/2019) 3. Do you have a family history of Colon Cancer or Polyps? no 4. Diabetes Mellitus? no 5. Joint replacements in the past 12 months?no 6. Major health problems in the past 3 months?no 7. Any artificial heart valves, MVP, or defibrillator?no    MEDICATIONS & ALLERGIES:    Patient reports the following regarding taking any anticoagulation/antiplatelet therapy:   Plavix, Coumadin, Eliquis, Xarelto, Lovenox, Pradaxa, Brilinta, or Effient? no Aspirin? yes (81 mg)  Patient confirms/reports the following medications:  Current Outpatient Medications  Medication Sig Dispense Refill   amLODipine (NORVASC) 5 MG tablet Take 1 tablet (5 mg total) by mouth daily. 90 tablet 1   aspirin EC 81 MG tablet Take 1 tablet (81 mg total) by mouth daily. Swallow whole. 90 tablet 3   atorvastatin (LIPITOR) 80 MG tablet Take 1 tablet (80 mg total) by mouth daily. 90 tablet 1   colchicine 0.6 MG tablet Take 2 tabs immediately, then 1 tab twice per day for the duration of the flare up to a max of 7 9 tablet 0   fluticasone (FLONASE) 50 MCG/ACT nasal spray USE 2 SPRAYS EACH NOSTRIL DAILY 16 g 1   hydrochlorothiazide (HYDRODIURIL) 25 MG tablet Take 1 tablet (25 mg total) by mouth daily. 90 tablet 1   Loratadine (ALAVERT PO) Take by mouth. 1 dissolvable tablet as needed daily for allergies.      sildenafil (REVATIO) 20 MG tablet Take 1 tablet (20 mg total) by mouth 3 (three) times daily. 30 tablet 11   telmisartan (MICARDIS) 80 MG tablet Take 1 tablet (80 mg total) by mouth daily. 90 tablet 1   zolpidem (AMBIEN) 10 MG tablet Take 1 tablet (10 mg total) by mouth at bedtime as needed. for sleep 30 tablet 5   No current  facility-administered medications for this visit.    Patient confirms/reports the following allergies:  Not on File  No orders of the defined types were placed in this encounter.   AUTHORIZATION INFORMATION Primary Insurance: 1D#: Group #:  Secondary Insurance: 1D#: Group #:  SCHEDULE INFORMATION: Date: 12/26/2022 Time: Location:  [facility]

## 2022-10-31 MED ORDER — ZOLPIDEM TARTRATE 10 MG PO TABS: 10.0000 mg | ORAL_TABLET | Freq: Every evening | ORAL | 5 refills | Status: DC | PRN

## 2022-11-20 ENCOUNTER — Encounter: Payer: Self-pay | Admitting: Family Medicine

## 2022-11-20 ENCOUNTER — Other Ambulatory Visit: Payer: Self-pay

## 2022-11-20 ENCOUNTER — Ambulatory Visit (INDEPENDENT_AMBULATORY_CARE_PROVIDER_SITE_OTHER): Payer: Medicare Other | Admitting: Family Medicine

## 2022-11-20 ENCOUNTER — Emergency Department
Admission: EM | Admit: 2022-11-20 | Discharge: 2022-11-20 | Disposition: A | Discharge status: Home / Self Care | Payer: Medicare Other | Attending: Emergency Medicine | Admitting: Emergency Medicine

## 2022-11-20 ENCOUNTER — Emergency Department: Payer: Medicare Other

## 2022-11-20 VITALS — BP 130/72 | HR 58 | Temp 98.1°F | Ht 73.0 in | Wt 246.4 lb

## 2022-11-20 DIAGNOSIS — K625 Hemorrhage of anus and rectum: Secondary | ICD-10-CM | POA: Diagnosis not present

## 2022-11-20 DIAGNOSIS — I10 Essential (primary) hypertension: Secondary | ICD-10-CM | POA: Diagnosis not present

## 2022-11-20 DIAGNOSIS — E782 Mixed hyperlipidemia: Secondary | ICD-10-CM

## 2022-11-20 DIAGNOSIS — K5732 Diverticulitis of large intestine without perforation or abscess without bleeding: Secondary | ICD-10-CM | POA: Diagnosis not present

## 2022-11-20 DIAGNOSIS — K5792 Diverticulitis of intestine, part unspecified, without perforation or abscess without bleeding: Secondary | ICD-10-CM | POA: Diagnosis not present

## 2022-11-20 DIAGNOSIS — N529 Male erectile dysfunction, unspecified: Secondary | ICD-10-CM | POA: Diagnosis not present

## 2022-11-20 DIAGNOSIS — R1032 Left lower quadrant pain: Secondary | ICD-10-CM | POA: Diagnosis not present

## 2022-11-20 DIAGNOSIS — K6389 Other specified diseases of intestine: Secondary | ICD-10-CM | POA: Insufficient documentation

## 2022-11-20 DIAGNOSIS — G4733 Obstructive sleep apnea (adult) (pediatric): Secondary | ICD-10-CM | POA: Diagnosis not present

## 2022-11-20 DIAGNOSIS — K4020 Bilateral inguinal hernia, without obstruction or gangrene, not specified as recurrent: Secondary | ICD-10-CM | POA: Diagnosis not present

## 2022-11-20 DIAGNOSIS — N289 Disorder of kidney and ureter, unspecified: Secondary | ICD-10-CM | POA: Diagnosis not present

## 2022-11-20 LAB — POCT URINALYSIS DIPSTICK
Bilirubin, UA: NEGATIVE
Blood, UA: NEGATIVE
Glucose, UA: NEGATIVE
Ketones, UA: NEGATIVE
Leukocytes, UA: NEGATIVE
Nitrite, UA: NEGATIVE
Protein, UA: NEGATIVE
Spec Grav, UA: 1.02 (ref 1.010–1.025)
Urobilinogen, UA: 0.2 U/dL
pH, UA: 6.5 (ref 5.0–8.0)

## 2022-11-20 LAB — CBC
HCT: 40.6 % (ref 39.0–52.0)
Hemoglobin: 14.4 g/dL (ref 13.0–17.0)
MCH: 32.4 pg (ref 26.0–34.0)
MCHC: 35.5 g/dL (ref 30.0–36.0)
MCV: 91.4 fL (ref 80.0–100.0)
Platelets: 170 K/uL (ref 150–400)
RBC: 4.44 MIL/uL (ref 4.22–5.81)
RDW: 12.2 % (ref 11.5–15.5)
WBC: 8.2 K/uL (ref 4.0–10.5)
nRBC: 0 % (ref 0.0–0.2)

## 2022-11-20 LAB — COMPREHENSIVE METABOLIC PANEL
ALT: 29 U/L (ref 0–44)
AST: 34 U/L (ref 15–41)
Albumin: 4.1 g/dL (ref 3.5–5.0)
Alkaline Phosphatase: 83 U/L (ref 38–126)
Anion gap: 8 (ref 5–15)
BUN: 18 mg/dL (ref 8–23)
CO2: 23 mmol/L (ref 22–32)
Calcium: 9 mg/dL (ref 8.9–10.3)
Chloride: 106 mmol/L (ref 98–111)
Creatinine, Ser: 0.99 mg/dL (ref 0.61–1.24)
GFR, Estimated: >60 mL/min (ref >60)
Glucose, Bld: 99 mg/dL (ref 70–99)
Potassium: 3.3 mmol/L — ABNORMAL LOW (ref 3.5–5.1)
Sodium: 137 mmol/L (ref 135–145)
Total Bilirubin: 1.2 mg/dL (ref 0.3–1.2)
Total Protein: 7.4 g/dL (ref 6.5–8.1)

## 2022-11-20 LAB — LIPASE, BLOOD: Lipase: 27 U/L (ref 11–51)

## 2022-11-20 MED ORDER — IOHEXOL 300 MG/ML  SOLN
100.0000 mL | Freq: Once | INTRAMUSCULAR | Status: AC | PRN
  Administered 2022-11-20: 100 mL via INTRAVENOUS

## 2022-11-20 MED ORDER — SULFAMETHOXAZOLE-TRIMETHOPRIM 800-160 MG PO TABS: 1.0000 | ORAL_TABLET | Freq: Two times a day (BID) | ORAL | 0 refills | Status: DC

## 2022-11-20 MED ORDER — METRONIDAZOLE 500 MG PO TABS: 500.0000 mg | ORAL_TABLET | Freq: Three times a day (TID) | ORAL | 0 refills | Status: AC

## 2022-11-20 NOTE — Assessment & Plan Note (Signed)
New problem Pain started on Saturday, no clear precipitating event. Pain is constant and located in the lower abdomen, more on the left side. No associated fever, nausea, vomiting, or change in bowel habits. No history of trauma or heavy lifting. Pain increases on standing. -Refer to the emergency department for further evaluation and imaging.

## 2022-11-20 NOTE — Assessment & Plan Note (Addendum)
New problem. Hemodynamically stable Patient noticed rectal bleeding during the visit. No prior history of rectal bleeding. Patient has a history of hemorrhoids.  -Refer to the emergency department for immediate evaluation r/o diverticular bleed

## 2022-11-20 NOTE — ED Provider Notes (Signed)
[facility] Emergency Department Provider Note     Event Date/Time   First MD Initiated Contact with Patient 11/20/22 1619     (approximate)   History   Abdominal Pain   HPI  Walter Osborne is a 67 y.o. male with a history of HTN, obesity, HLD, OSA, and hemorrhoids, presents to the ED for lower abdominal pain.  He reported onset of left lower quad abdominal pain over the weekend he denies any associated nausea, vomiting, diarrhea.  Patient presented to his primary provider today for further evaluation.  They referred him to the ED for evaluation management of his symptoms.  He denies any fevers, chills, sweats.   Physical Exam   Triage Vital Signs: ED Triage Vitals  Encounter Vitals Group     BP 11/20/22 1331 (!) 146/88     Systolic BP Percentile --      Diastolic BP Percentile --      Pulse Rate 11/20/22 1330 64     Resp 11/20/22 1330 18     Temp 11/20/22 1330 98 F (36.7 C)     Temp src --      SpO2 11/20/22 1330 97 %     Weight --      Height --      Head Circumference --      Peak Flow --      Pain Score 11/20/22 1331 5     Pain Loc --      Pain Education --      Exclude from Growth Chart --     Most recent vital signs: Vitals:   11/20/22 1330 11/20/22 1331  BP:  (!) 146/88  Pulse: 64   Resp: 18   Temp: 98 F (36.7 C)   SpO2: 97%     General Awake, no distress. NAD CV:  Good peripheral perfusion. RRR RESP:  Normal effort. CTA ABD:  No distention.  Normal bowel sounds noted.  Mild tender palpation over the lower left greater than right quadrants  ED Results / Procedures / Treatments   Labs (all labs ordered are listed, but only abnormal results are displayed) Labs Reviewed  COMPREHENSIVE METABOLIC PANEL - Abnormal; Notable for the following components:      Result Value   Potassium 3.3 (*)    All other components within normal limits  LIPASE, BLOOD  CBC     EKG  RADIOLOGY  I personally viewed and  evaluated these images as part of my medical decision making, as well as reviewing the written report by the radiologist.  ED Provider Interpretation: Acute diverticulitis without rupture and questionable epiploic appendagitis  CT ABD/Pelvis w/ CM   IMPRESSION: 1. Inflammatory haziness and stranding adjacent to the distal descending colon may be due to acute diverticulitis. Presence of fat necrosis raises the alternative possibility of epiploic appendagitis. 2. Enlarged prostate. 3.  Aortic atherosclerosis (ICD10-I70.0).  PROCEDURES:  Critical Care performed: No  Procedures   MEDICATIONS ORDERED IN ED: Medications  iohexol (OMNIPAQUE) 300 MG/ML solution 100 mL (100 mLs Intravenous Contrast Given 11/20/22 1655)     IMPRESSION / MDM / ASSESSMENT AND PLAN / ED COURSE  I reviewed the triage vital signs and the nursing notes.                              Differential diagnosis includes, but is not limited to, acute appendicitis, renal colic, testicular torsion, urinary tract  infection/pyelonephritis, prostatitis,  epididymitis, diverticulitis, small bowel obstruction or ileus, colitis, abdominal aortic aneurysm, gastroenteritis, hernia, etc.  Patient's presentation is most consistent with acute complicated illness / injury requiring diagnostic workup.  Patient's diagnosis is consistent with acute diverticulitis without abscess or rupture and possible epiploic appendagitis.  Patient stable at this time for outpatient management and is agreeable to the plan.  Patient will be discharged home with prescriptions for metronidazole and Bactrim. Patient is to follow up with his primary provider as discussed, as needed or otherwise directed. Patient is given ED precautions to return to the ED for any worsening or new symptoms.   FINAL CLINICAL IMPRESSION(S) / ED DIAGNOSES   Final diagnoses:  Left lower quadrant abdominal pain  Diverticulitis  Epiploic appendagitis     Rx / DC Orders    ED Discharge Orders          Ordered    sulfamethoxazole-trimethoprim (BACTRIM DS) 800-160 MG tablet  2 times daily        11/20/22 1933    metroNIDAZOLE (FLAGYL) 500 MG tablet  3 times daily        11/20/22 1933             Note:  This document was prepared using Dragon voice recognition software and may include unintentional dictation errors.    Lissa Hoard, PA-C 11/25/22 0105    Loleta Rose, MD 12/06/22 1622

## 2022-11-20 NOTE — ED Triage Notes (Signed)
Pt to ED for lower abd pain started over weekend. Denies n/v/d.

## 2022-11-20 NOTE — Assessment & Plan Note (Signed)
Patient is on amlodipine and telmisartan for management. -Continue current medications.

## 2022-11-20 NOTE — Assessment & Plan Note (Signed)
Patient has a history of erectile dysfunction and was previously prescribed sildenafil. Not currently taking medication -No changes to treatment at this time.

## 2022-11-20 NOTE — Discharge Instructions (Addendum)
Take the prescription meds as directed. Follow-up with your primary provider and GI Specialist as discussed.

## 2022-11-20 NOTE — Assessment & Plan Note (Signed)
Patient uses a sleep apnea machine and takes Flonase at night. -Continue current treatment regimen.

## 2022-11-20 NOTE — Progress Notes (Signed)
SUBJECTIVE:   Chief Complaint  Patient presents with   Abdominal Pain    Left lower abdomin started Saturday.  When sitting pain is more dull, when standing sharpe.     HPI Discussed the use of AI scribe software for clinical note transcription with the patient, who gave verbal consent to proceed.  History of Present Illness The patient, a 67 year old with a history of hypertension and gout, presented with a complaint of lower abdominal pain that started on Saturday. The pain was described as being located in the lower abdomen, predominantly on the left side but recently also noted on the right side. The pain was not associated with any specific activity and was noted to be worse upon standing. The patient denied any associated fever, penile discharge, testicular pain, or swelling. Has had no hematuria or bloody stool.  However, the patient reported a recent onset of lower back pain. No recent use of NSAIDS.  Drinks 3 cups coffee daily.  The patient's medical history includes hypertension, managed with amlodipine and telmisartan, and gout, managed with colchicine as needed. The patient also takes Lipitor for cholesterol management, fluticasone for allergies, and hydrochlorothiazide. The patient has a history of erectile dysfunction, previously managed with sildenafil, but has not taken this medication for several years. The patient also takes Ambien for sleep and uses a sleep apnea machine.  The patient reported a recent change in bowel habits, with constipation. During the consultation, the patient noticed an unexpected rectal discharge, which was not present at the beginning of the consultation. The patient has a history of hemorrhoids and is due for a colonoscopy later in the year. The patient denied any history of kidney stones and reported good hydration habits.  The patient's current medications include over-the-counter Flonase for allergies and Alavert during allergy season. The patient  denied any use of herbal medications. The patient's urine test came back clear.    PERTINENT PMH / PSH: As above  OBJECTIVE:  BP 130/72   Pulse (!) 58   Temp 98.1 F (36.7 C) (Oral)   Ht 6\' 1"  (1.854 m)   Wt 246 lb 6.4 oz (111.8 kg)   SpO2 98%   BMI 32.51 kg/m    Physical Exam Vitals reviewed.  Constitutional:      General: He is not in acute distress.    Appearance: He is normal weight. He is not ill-appearing, toxic-appearing or diaphoretic.  Abdominal:     General: There is no distension.     Tenderness: There is abdominal tenderness in the left lower quadrant. Negative signs include Murphy's sign and McBurney's sign.     Hernia: No hernia is present.  Genitourinary:    Rectum: Mass present.     Comments: Erythematous bulging rectal mass        11/20/2022   12:06 PM 06/12/2022    4:14 PM 11/16/2021    9:05 AM 11/16/2021    8:58 AM 11/18/2020    8:18 AM  Depression screen PHQ 2/9  Decreased Interest 0 0 0 0 0  Down, Depressed, Hopeless 0 0 0 0 0  PHQ - 2 Score 0 0 0 0 0  Altered sleeping 0  0    Tired, decreased energy 0  0    Change in appetite 0  0    Feeling bad or failure about yourself  0  0    Trouble concentrating 0  0    Moving slowly or fidgety/restless 0  0  Suicidal thoughts 0  0    PHQ-9 Score 0  0    Difficult doing work/chores   Not difficult at all        11/20/2022   12:06 PM 11/16/2021    9:08 AM  GAD 7 : Generalized Anxiety Score  Nervous, Anxious, on Edge 0 0  Control/stop worrying 0 0  Worry too much - different things 0 0  Trouble relaxing 0 0  Restless 0 0  Easily annoyed or irritable 0 0  Afraid - awful might happen 0 0  Total GAD 7 Score 0 0  Anxiety Difficulty  Not difficult at all    ASSESSMENT/PLAN:  Rectal bleeding Assessment & Plan: New problem. Hemodynamically stable Patient noticed rectal bleeding during the visit. No prior history of rectal bleeding. Patient has a history of hemorrhoids.  -Refer to the emergency  department for immediate evaluation r/o diverticular bleed   Pain, abdominal, LLQ Assessment & Plan: New problem Pain started on Saturday, no clear precipitating event. Pain is constant and located in the lower abdomen, more on the left side. No associated fever, nausea, vomiting, or change in bowel habits. No history of trauma or heavy lifting. Pain increases on standing. -Refer to the emergency department for further evaluation and imaging.  Orders: -     POCT urinalysis dipstick  Erectile dysfunction, unspecified erectile dysfunction type Assessment & Plan: Patient has a history of erectile dysfunction and was previously prescribed sildenafil. Not currently taking medication -No changes to treatment at this time.   OSA on CPAP Assessment & Plan: Patient uses a sleep apnea machine and takes Flonase at night. -Continue current treatment regimen.   Primary hypertension Assessment & Plan: Patient is on amlodipine and telmisartan for management. -Continue current medications.   Mixed hyperlipidemia Assessment & Plan: Patient is on Lipitor for management. -Continue current medication.    PDMP reviewed  Return if symptoms worsen or fail to improve, for PCP.  Dana Allan, MD

## 2022-11-20 NOTE — Assessment & Plan Note (Signed)
Patient is on Lipitor for management. -Continue current medication.

## 2022-11-20 NOTE — Telephone Encounter (Signed)
Phone call to pt.  He reports that this is new pain that he has never experienced before.  When sitting he describes it as dull but when moving around describes it as sharpe.  Scheduled to see Dr. Clent Ridges at 11:40a today.

## 2022-11-20 NOTE — Patient Instructions (Addendum)
It was a pleasure meeting you today. Thank you for allowing me to take part in your health care.  Our goals for today as we discussed include:  Go to [facility] ED for evaluation of rectal bleeding They are aware that you will be arriving    If you have any questions or concerns, please do not hesitate to call the office at [phone].  I look forward to our next visit and until then take care and stay safe.  Regards,   Dana Allan, MD   [facility] [place]

## 2022-11-21 ENCOUNTER — Encounter: Payer: Self-pay | Admitting: Family Medicine

## 2022-11-22 ENCOUNTER — Other Ambulatory Visit: Payer: Self-pay | Admitting: Internal Medicine

## 2022-11-28 ENCOUNTER — Ambulatory Visit (INDEPENDENT_AMBULATORY_CARE_PROVIDER_SITE_OTHER): Payer: Medicare Other | Admitting: *Deleted

## 2022-11-28 VITALS — Ht 73.0 in | Wt 238.0 lb

## 2022-11-28 DIAGNOSIS — Z0000 Encounter for general adult medical examination without abnormal findings: Secondary | ICD-10-CM | POA: Diagnosis not present

## 2022-11-28 NOTE — Patient Instructions (Signed)
Mr. Walter Osborne , Thank you for taking time to come for your Medicare Wellness Visit. I appreciate your ongoing commitment to your health goals. Please review the following plan we discussed and let me know if I can assist you in the future.   Referrals/Orders/Follow-Ups/Clinician Recommendations: Remember to get your flu vaccine and consider the shingles vaccines  This is a list of the screening recommended for you and due dates:  Health Maintenance  Topic Date Due   Zoster (Shingles) Vaccine (1 of 2) Never done   COVID-19 Vaccine (4 - 2023-24 season) 10/15/2022   Colon Cancer Screening  12/25/2022   Flu Shot  05/14/2023*   Medicare Annual Wellness Visit  11/28/2023   DTaP/Tdap/Td vaccine (2 - Td or Tdap) 01/23/2024   Pneumonia Vaccine  Completed   Hepatitis C Screening  Completed   HPV Vaccine  Aged Out  *Topic was postponed. The date shown is not the original due date.    Advanced directives: (Declined) Advance directive discussed with you today. Even though you declined this today, please call our office should you change your mind, and we can give you the proper paperwork for you to fill out. Patient has the paperwork and will work on   Next [employer] Visit scheduled for next year: Yes 12/04/23 @ 9:00

## 2022-11-28 NOTE — Progress Notes (Signed)
Subjective:   Walter Osborne is a 67 y.o. male who presents for an Initial Medicare Annual Wellness Visit.  Visit Complete: Virtual I connected with  Walter Osborne on 11/28/22 by a audio enabled telemedicine application and verified that I am speaking with the correct person using two identifiers.  Patient Location: Home  Provider Location: Office/Clinic  I discussed the limitations of evaluation and management by telemedicine. The patient expressed understanding and agreed to proceed.  Vital Signs: Because this visit was a virtual/telehealth visit, some criteria may be missing or patient reported. Any vitals not documented were not able to be obtained and vitals that have been documented are patient reported.  Patient Medicare AWV questionnaire was completed by the patient on 11/27/22; I have confirmed that all information answered by patient is correct and no changes since this date.  Cardiac Risk Factors include: advanced age (>56men, >67 women);male gender;obesity (BMI >30kg/m2);hypertension;dyslipidemia     Objective:    Today's Vitals   11/28/22 0820  Weight: 238 lb (108 kg)  Height: 6\' 1"  (1.854 m)   Body mass index is 31.4 kg/m.     11/28/2022    8:32 AM 11/20/2022    1:32 PM 12/25/2019    8:48 AM  Advanced Directives  Does Patient Have a Medical Advance Directive? No No No  Would patient like information on creating a medical advance directive? No - Patient declined  No - Patient declined    Current Medications (verified) Outpatient Encounter Medications as of 11/28/2022  Medication Sig   amLODipine (NORVASC) 5 MG tablet Take 1 tablet (5 mg total) by mouth daily.   aspirin EC 81 MG tablet Take 1 tablet (81 mg total) by mouth daily. Swallow whole.   atorvastatin (LIPITOR) 80 MG tablet Take 1 tablet (80 mg total) by mouth daily.   colchicine 0.6 MG tablet TAKE 2 TABLETS BY MOUTH IMMEDIATELY THEN TAKE 1 TABLET TWICE DAILY FOR  THE  DURATION  OF  THE   FLARE  UP  TO  A  MAXIMUM  OF  7   fluticasone (FLONASE) 50 MCG/ACT nasal spray USE 2 SPRAYS EACH NOSTRIL DAILY   hydrochlorothiazide (HYDRODIURIL) 25 MG tablet Take 1 tablet (25 mg total) by mouth daily.   Loratadine (ALAVERT PO) Take by mouth. 1 dissolvable tablet as needed daily for allergies.    sulfamethoxazole-trimethoprim (BACTRIM DS) 800-160 MG tablet Take 1 tablet by mouth 2 (two) times daily.   telmisartan (MICARDIS) 80 MG tablet Take 1 tablet (80 mg total) by mouth daily.   zolpidem (AMBIEN) 10 MG tablet Take 1 tablet (10 mg total) by mouth at bedtime as needed. for sleep   sildenafil (REVATIO) 20 MG tablet Take 1 tablet (20 mg total) by mouth 3 (three) times daily. (Patient not taking: Reported on 11/28/2022)   No facility-administered encounter medications on file as of 11/28/2022.    Allergies (verified) Patient has no known allergies.   History: Past Medical History:  Diagnosis Date   Hyperlipidemia    Hypertension    Sleep apnea    Past Surgical History:  Procedure Laterality Date   COLONOSCOPY     COLONOSCOPY WITH PROPOFOL N/A 12/25/2019   Procedure: COLONOSCOPY WITH PROPOFOL;  Surgeon: [name], MD;  Location: [facility] ENDOSCOPY;  Service: Gastroenterology;  Laterality: N/A;   Family History  Problem Relation Age of Onset   Hyperlipidemia Mother    Diabetes Father    CAD Father        MI at  age 5   Cancer Maternal Aunt        Lung CA   Heart attack Maternal Grandfather 75   Social History   Socioeconomic History   Marital status: Married    Spouse name: Not on file   Number of children: Not on file   Years of education: Not on file   Highest education level: Some college, no degree  Occupational History    Employer: [employer]    Comment: travels,    Tobacco Use   Smoking status: Former    Current packs/day: 0.00    Types: Cigarettes    Start date: 02/14/1983    Quit date: 02/13/1993    Years since quitting: 29.8   Smokeless tobacco:  Never  Vaping Use   Vaping status: Never Used  Substance and Sexual Activity   Alcohol use: Yes    Alcohol/week: 4.0 standard drinks of alcohol    Types: 4 Glasses of wine per week   Drug use: No   Sexual activity: Not on file  Other Topics Concern   Not on file  Social History Narrative   Married   Social Determinants of Health   Financial Resource Strain: Low Risk  (11/27/2022)   Overall Financial Resource Strain (CARDIA)    Difficulty of Paying Living Expenses: Not hard at all  Food Insecurity: No Food Insecurity (11/27/2022)   Hunger Vital Sign    Worried About Running Out of Food in the Last Year: Never true    Ran Out of Food in the Last Year: Never true  Transportation Needs: No Transportation Needs (11/27/2022)   PRAPARE - Administrator, Civil Service (Medical): No    Lack of Transportation (Non-Medical): No  Physical Activity: Insufficiently Active (11/27/2022)   Exercise Vital Sign    Days of Exercise per Week: 3 days    Minutes of Exercise per Session: 30 min  Stress: Stress Concern Present (11/27/2022)   [employer] of Occupational Health - Occupational Stress Questionnaire    Feeling of Stress : To some extent  Social Connections: Moderately Integrated (11/27/2022)   Social Connection and Isolation Panel [NHANES]    Frequency of Communication with Friends and Family: More than three times a week    Frequency of Social Gatherings with Friends and Family: Three times a week    Attends Religious Services: Never    Active Member of Clubs or Organizations: Yes    Attends Engineer, structural: More than 4 times per year    Marital Status: Married    Tobacco Counseling Counseling given: Not Answered   Clinical Intake:  Pre-visit preparation completed: Yes  Pain : No/denies pain     BMI - recorded: 31.4 Nutritional Status: BMI > 30  Obese Nutritional Risks: None Diabetes: No  How often do you need to have someone help you  when you read instructions, pamphlets, or other written materials from your doctor or pharmacy?: 1 - Never  Interpreter Needed?: No  Information entered by :: R. Alexsandria Kivett LPN   Activities of Daily Living    11/27/2022   10:46 AM  In your present state of health, do you have any difficulty performing the following activities:  Hearing? 0  Vision? 0  Difficulty concentrating or making decisions? 0  Walking or climbing stairs? 0  Dressing or bathing? 0  Doing errands, shopping? 0  Preparing Food and eating ? N  Using the Toilet? N  In the past six months, have  you accidently leaked urine? N  Do you have problems with loss of bowel control? N  Managing your Medications? N  Managing your Finances? N  Housekeeping or managing your Housekeeping? N    Patient Care Team: Sherlene Shams, MD as PCP - General (Internal Medicine) Debbe Odea, MD as PCP - Cardiology (Cardiology) Kieth Brightly, MD (General Surgery) Sherlene Shams, MD (Internal Medicine)  Indicate any recent Medical Services you may have received from other than [facility] providers in the past year (date may be approximate).     Assessment:   This is a routine wellness examination for Charlie.  Hearing/Vision screen Hearing Screening - Comments:: No issues Vision Screening - Comments:: Glasses at times   Goals Addressed             This Visit's Progress    Patient Stated       Wants time to exercise more       Depression Screen    11/28/2022    8:27 AM 11/20/2022   12:06 PM 06/12/2022    4:14 PM 11/16/2021    9:05 AM 11/16/2021    8:58 AM 11/18/2020    8:18 AM 06/23/2020    4:16 PM  PHQ 2/9 Scores  PHQ - 2 Score 0 0 0 0 0 0 0  PHQ- 9 Score 1 0  0       Fall Risk    11/27/2022   10:46 AM 11/20/2022   12:06 PM 06/12/2022    4:14 PM 11/16/2021    8:58 AM 11/18/2020    8:18 AM  Fall Risk   Falls in the past year? 0 0 0 0 0  Number falls in past yr: 0 0 0    Injury with Fall? 0 0 0    Risk  for fall due to : No Fall Risks No Fall Risks No Fall Risks No Fall Risks   Follow up Falls prevention discussed;Falls evaluation completed  Falls evaluation completed Falls evaluation completed Falls evaluation completed    MEDICARE RISK AT HOME: Medicare Risk at Home Any stairs in or around the home?: Yes If so, are there any without handrails?: No Home free of loose throw rugs in walkways, pet beds, electrical cords, etc?: No Adequate lighting in your home to reduce risk of falls?: Yes Life alert?: No Use of a cane, walker or w/c?: No Grab bars in the bathroom?: No Shower chair or bench in shower?: No Elevated toilet seat or a handicapped toilet?: No   Cognitive Function:    11/16/2021    9:10 AM  MMSE - Mini Mental State Exam  Orientation to time 5  Orientation to Place 5  Registration 3  Attention/ Calculation 5  Recall 3  Language- name 2 objects 2  Language- repeat 1  Language- follow 3 step command 3  Language- read & follow direction 1  Write a sentence 1  Copy design 1  Total score 30        11/28/2022    8:33 AM 11/16/2021    9:01 AM  6CIT Screen  What Year? 0 points 0 points  What month? 0 points 0 points  What time? 0 points 0 points  Count back from 20 0 points 0 points  Months in reverse 0 points 0 points  Repeat phrase 0 points 0 points  Total Score 0 points 0 points    Immunizations Immunization History  Administered Date(s) Administered   Influenza,inj,Quad PF,6+ Mos 12/16/2012, 01/22/2014, 03/20/2016,  12/12/2017, 01/27/2021   Moderna SARS-COV2 Booster Vaccination 11/06/2019   PFIZER(Purple Top)SARS-COV-2 Vaccination 05/09/2019, 06/03/2019   PNEUMOCOCCAL CONJUGATE-20 11/16/2021   Tdap 01/22/2014    TDAP status: Up to date  Flu Vaccine status: Due, Education has been provided regarding the importance of this vaccine. Advised may receive this vaccine at local pharmacy or Health Dept. Aware to provide a copy of the vaccination record if  obtained from local pharmacy or Health Dept. Verbalized acceptance and understanding.  Pneumococcal vaccine status: Up to date  Covid-19 vaccine status: Information provided on how to obtain vaccines.   Qualifies for Shingles Vaccine? Yes   Zostavax completed No   Shingrix Completed?: No.    Education has been provided regarding the importance of this vaccine. Patient has been advised to call insurance company to determine out of pocket expense if they have not yet received this vaccine. Advised may also receive vaccine at local pharmacy or Health Dept. Verbalized acceptance and understanding.  Screening Tests Health Maintenance  Topic Date Due   Zoster Vaccines- Shingrix (1 of 2) Never done   COVID-19 Vaccine (4 - 2023-24 season) 10/15/2022   Medicare Annual Wellness (AWV)  11/17/2022   Colonoscopy  12/25/2022   INFLUENZA VACCINE  05/14/2023 (Originally 09/14/2022)   DTaP/Tdap/Td (2 - Td or Tdap) 01/23/2024   Pneumonia Vaccine 16+ Years old  Completed   Hepatitis C Screening  Completed   HPV VACCINES  Aged Out    Health Maintenance  Health Maintenance Due  Topic Date Due   Zoster Vaccines- Shingrix (1 of 2) Never done   COVID-19 Vaccine (4 - 2023-24 season) 10/15/2022   Medicare Annual Wellness (AWV)  11/17/2022   Colonoscopy  12/25/2022    Colorectal cancer screening: Type of screening: Colonoscopy. Completed 12/2019. Repeat every 3 years Appointment scheduled  Lung Cancer Screening: (Low Dose CT Chest recommended if Age 73-80 years, 20 pack-year currently smoking OR have quit w/in 15years.) does not qualify.    Additional Screening:  Hepatitis C Screening: does qualify; Completed 12/2019  Vision Screening: Recommended annual ophthalmology exams for early detection of glaucoma and other disorders of the eye. Is the patient up to date with their annual eye exam?  Yes  Who is the provider or what is the name of the office in which the patient attends annual eye exams?  [facility] If pt is not established with a provider, would they like to be referred to a provider to establish care? No .   Dental Screening: Recommended annual dental exams for proper oral hygiene    Community Resource Referral / Chronic Care Management: CRR required this visit?  No   CCM required this visit?  No    Plan:     I have personally reviewed and noted the following in the patient's chart:   Medical and social history Use of alcohol, tobacco or illicit drugs  Current medications and supplements including opioid prescriptions. Patient is not currently taking opioid prescriptions. Functional ability and status Nutritional status Physical activity Advanced directives List of other physicians Hospitalizations, surgeries, and ER visits in previous 12 months Vitals Screenings to include cognitive, depression, and falls Referrals and appointments  In addition, I have reviewed and discussed with patient certain preventive protocols, quality metrics, and best practice recommendations. A written personalized care plan for preventive services as well as general preventive health recommendations were provided to patient.    c Sydell Axon, LPN   09/81/1914   After Visit Summary: (MyChart)  Due to this being a telephonic visit, the after visit summary with patients personalized plan was offered to patient via MyChart   Nurse Notes: None

## 2022-12-13 ENCOUNTER — Ambulatory Visit: Payer: Medicare Other | Admitting: Internal Medicine

## 2022-12-13 ENCOUNTER — Encounter: Payer: Self-pay | Admitting: Internal Medicine

## 2022-12-13 VITALS — BP 136/64 | HR 62 | Ht 73.0 in | Wt 241.8 lb

## 2022-12-13 DIAGNOSIS — M109 Gout, unspecified: Secondary | ICD-10-CM | POA: Insufficient documentation

## 2022-12-13 DIAGNOSIS — T502X5A Adverse effect of carbonic-anhydrase inhibitors, benzothiadiazides and other diuretics, initial encounter: Secondary | ICD-10-CM

## 2022-12-13 DIAGNOSIS — I10 Essential (primary) hypertension: Secondary | ICD-10-CM

## 2022-12-13 DIAGNOSIS — E782 Mixed hyperlipidemia: Secondary | ICD-10-CM | POA: Diagnosis not present

## 2022-12-13 DIAGNOSIS — Z125 Encounter for screening for malignant neoplasm of prostate: Secondary | ICD-10-CM | POA: Diagnosis not present

## 2022-12-13 DIAGNOSIS — K5792 Diverticulitis of intestine, part unspecified, without perforation or abscess without bleeding: Secondary | ICD-10-CM

## 2022-12-13 DIAGNOSIS — K760 Fatty (change of) liver, not elsewhere classified: Secondary | ICD-10-CM

## 2022-12-13 DIAGNOSIS — R5383 Other fatigue: Secondary | ICD-10-CM

## 2022-12-13 DIAGNOSIS — R972 Elevated prostate specific antigen [PSA]: Secondary | ICD-10-CM

## 2022-12-13 DIAGNOSIS — E876 Hypokalemia: Secondary | ICD-10-CM | POA: Insufficient documentation

## 2022-12-13 DIAGNOSIS — R7301 Impaired fasting glucose: Secondary | ICD-10-CM | POA: Diagnosis not present

## 2022-12-13 DIAGNOSIS — R159 Full incontinence of feces: Secondary | ICD-10-CM

## 2022-12-13 DIAGNOSIS — Z8739 Personal history of other diseases of the musculoskeletal system and connective tissue: Secondary | ICD-10-CM

## 2022-12-13 DIAGNOSIS — N529 Male erectile dysfunction, unspecified: Secondary | ICD-10-CM

## 2022-12-13 LAB — CBC WITH DIFFERENTIAL/PLATELET
Basophils Absolute: 0 10*3/uL (ref 0.0–0.1)
Basophils Relative: 0.8 % (ref 0.0–3.0)
Eosinophils Absolute: 0.1 10*3/uL (ref 0.0–0.7)
Eosinophils Relative: 2.4 % (ref 0.0–5.0)
HCT: 41.2 % (ref 39.0–52.0)
Hemoglobin: 14.1 g/dL (ref 13.0–17.0)
Lymphocytes Relative: 32.4 % (ref 12.0–46.0)
Lymphs Abs: 1.7 10*3/uL (ref 0.7–4.0)
MCHC: 34.3 g/dL (ref 30.0–36.0)
MCV: 93.3 fL (ref 78.0–100.0)
Monocytes Absolute: 0.5 10*3/uL (ref 0.1–1.0)
Monocytes Relative: 9.5 % (ref 3.0–12.0)
Neutro Abs: 2.9 10*3/uL (ref 1.4–7.7)
Neutrophils Relative %: 54.9 % (ref 43.0–77.0)
Platelets: 210 10*3/uL (ref 150.0–400.0)
RBC: 4.41 Mil/uL (ref 4.22–5.81)
RDW: 13.1 % (ref 11.5–15.5)
WBC: 5.3 10*3/uL (ref 4.0–10.5)

## 2022-12-13 LAB — LIPID PANEL
Cholesterol: 152 mg/dL (ref 0–200)
HDL: 47.8 mg/dL (ref >39.00)
LDL Cholesterol: 76 mg/dL (ref 0–99)
NonHDL: 104.63
Total CHOL/HDL Ratio: 3
Triglycerides: 142 mg/dL (ref 0.0–149.0)
VLDL: 28.4 mg/dL (ref 0.0–40.0)

## 2022-12-13 LAB — PSA, MEDICARE: PSA: 5.83 ng/mL — ABNORMAL HIGH (ref 0.10–4.00)

## 2022-12-13 LAB — LDL CHOLESTEROL, DIRECT: Direct LDL: 85 mg/dL

## 2022-12-13 LAB — TSH: TSH: 1.36 u[IU]/mL (ref 0.35–5.50)

## 2022-12-13 LAB — HEMOGLOBIN A1C: Hgb A1c MFr Bld: 5.9 % (ref 4.6–6.5)

## 2022-12-13 MED ORDER — POTASSIUM CHLORIDE CRYS ER 20 MEQ PO TBCR: 20.0000 meq | EXTENDED_RELEASE_TABLET | Freq: Every day | ORAL | 3 refills | Status: DC

## 2022-12-13 MED ORDER — COLCHICINE 0.6 MG PO TABS: ORAL_TABLET | ORAL | 5 refills | Status: DC

## 2022-12-13 MED ORDER — TELMISARTAN 80 MG PO TABS: 80.0000 mg | ORAL_TABLET | Freq: Every day | ORAL | 1 refills | Status: DC

## 2022-12-13 MED ORDER — AMLODIPINE BESYLATE 5 MG PO TABS: 5.0000 mg | ORAL_TABLET | Freq: Every day | ORAL | 1 refills | Status: DC

## 2022-12-13 MED ORDER — ATORVASTATIN CALCIUM 80 MG PO TABS: 80.0000 mg | ORAL_TABLET | Freq: Every day | ORAL | 1 refills | Status: DC

## 2022-12-13 MED ORDER — HYDROCHLOROTHIAZIDE 25 MG PO TABS: 25.0000 mg | ORAL_TABLET | Freq: Every day | ORAL | 1 refills | Status: DC

## 2022-12-13 NOTE — Assessment & Plan Note (Signed)
At goal on atorvastatin 80 mg . No changes today  Lab Results  Component Value Date   CHOL 152 12/13/2022   HDL 47.80 12/13/2022   LDLCALC 76 12/13/2022   LDLDIRECT 85.0 12/13/2022   TRIG 142.0 12/13/2022   CHOLHDL 3 12/13/2022

## 2022-12-13 NOTE — Assessment & Plan Note (Signed)
Presumed by ultrasound changes and serologies negative for autoimmune causes of hepatitis in November 2021.  He was offered referral to liver specialist at time of workup but declined.  He has been following a low GI diet  Reducing alcohol use, , exercising and taking meds  But did not take the prescribed metformin. Marland KitchenLFTs are normal   Lab Results  Component Value Date   ALT 29 11/20/2022   AST 34 11/20/2022   ALKPHOS 83 11/20/2022   BILITOT 1.2 11/20/2022

## 2022-12-13 NOTE — Patient Instructions (Signed)
The new "normal"  for  blood pressure is now LESS THAN 130/80   .  Please check your blood pressure a few times at home and send me the readings so I can determine if you need to  increase your amlodipine dose

## 2022-12-13 NOTE — Assessment & Plan Note (Addendum)
He reports painless intermittent rectal leakage on NON FECAL MATERIAL.  Colonoscopy is scheduled

## 2022-12-13 NOTE — Progress Notes (Signed)
Subjective:  Patient ID: Walter Osborne, male    DOB: 23-Jun-1955  Age: 67 y.o. MRN: [id number]  CC: The primary encounter diagnosis was History of gout. Diagnoses of Prostate cancer screening, Mixed hyperlipidemia, Fatigue, unspecified type, Impaired fasting glucose, Elevated PSA, less than 10 ng/ml, Incontinence of feces, unspecified fecal incontinence type, Diverticulitis, Erectile dysfunction, unspecified erectile dysfunction type, Hepatic steatosis, Primary hypertension, and Diuretic-induced hypokalemia were also pertinent to this visit.   HPI Walter Osborne presents for  Chief Complaint  Patient presents with   Medical Management of Chronic Issues    6 month follow up    1) HTN:   he has been taking amlodipine 5,  hydrochlorothiazide 25  mg in the mornign ,  and  telmisartan 80  mg  taken at bedtime   2)HLD:  taking lipitor 80 mg without myalgias   3) obesity:   reviewed prior attempts at weight loss.  personal best 231 lbs   Cc:  has been experiencing  "anal leakage"  of clear liquid for the last several months .  Was  treated for  diverticulitis episode 2 weeks ago  after CT scan suggested = inflammation.  Was treated with septra ds and metronidazole with good response  LLQ pin has resolved    Outpatient Medications Prior to Visit  Medication Sig Dispense Refill   aspirin EC 81 MG tablet Take 1 tablet (81 mg total) by mouth daily. Swallow whole. 90 tablet 3   fluticasone (FLONASE) 50 MCG/ACT nasal spray USE 2 SPRAYS EACH NOSTRIL DAILY 16 g 1   Loratadine (ALAVERT PO) Take by mouth. 1 dissolvable tablet as needed daily for allergies.      zolpidem (AMBIEN) 10 MG tablet Take 1 tablet (10 mg total) by mouth at bedtime as needed. for sleep 30 tablet 5   colchicine 0.6 MG tablet TAKE 2 TABLETS BY MOUTH IMMEDIATELY THEN TAKE 1 TABLET TWICE DAILY FOR  THE  DURATION  OF  THE  FLARE  UP  TO  A  MAXIMUM  OF  7 9 tablet 0   PEG 3350-KCl-NaBcb-NaCl-NaSulf (PEG-3350/ELECTROLYTES)  236 g SOLR Take 4,000 mLs by mouth once. (Patient not taking: Reported on 12/13/2022)     amLODipine (NORVASC) 5 MG tablet Take 1 tablet (5 mg total) by mouth daily. 90 tablet 1   atorvastatin (LIPITOR) 80 MG tablet Take 1 tablet (80 mg total) by mouth daily. 90 tablet 1   hydrochlorothiazide (HYDRODIURIL) 25 MG tablet Take 1 tablet (25 mg total) by mouth daily. 90 tablet 1   sildenafil (REVATIO) 20 MG tablet Take 1 tablet (20 mg total) by mouth 3 (three) times daily. (Patient not taking: Reported on 11/28/2022) 30 tablet 11   sulfamethoxazole-trimethoprim (BACTRIM DS) 800-160 MG tablet Take 1 tablet by mouth 2 (two) times daily. (Patient not taking: Reported on 12/13/2022) 20 tablet 0   telmisartan (MICARDIS) 80 MG tablet Take 1 tablet (80 mg total) by mouth daily. 90 tablet 1   No facility-administered medications prior to visit.    Review of Systems;  Patient denies headache, fevers, malaise, unintentional weight loss, skin rash, eye pain, sinus congestion and sinus pain, sore throat, dysphagia,  hemoptysis , cough, dyspnea, wheezing, chest pain, palpitations, orthopnea, edema, abdominal pain, nausea, melena, diarrhea, constipation, flank pain, dysuria, hematuria, urinary  Frequency, nocturia, numbness, tingling, seizures,  Focal weakness, Loss of consciousness,  Tremor, insomnia, depression, anxiety, and suicidal ideation.      Objective:  BP 136/64   Pulse 62  Ht 6\' 1"  (1.854 m)   Wt 241 lb 12.8 oz (109.7 kg)   SpO2 97%   BMI 31.90 kg/m   BP Readings from Last 3 Encounters:  12/13/22 136/64  11/20/22 (!) 146/88  11/20/22 130/72    Wt Readings from Last 3 Encounters:  12/13/22 241 lb 12.8 oz (109.7 kg)  11/28/22 238 lb (108 kg)  11/20/22 246 lb 6.4 oz (111.8 kg)    Physical Exam  Lab Results  Component Value Date   HGBA1C 5.9 12/13/2022   HGBA1C 5.8 11/10/2021   HGBA1C 5.6 05/16/2021    Lab Results  Component Value Date   CREATININE 0.99 11/20/2022   CREATININE  1.13 07/12/2022   CREATININE 1.09 05/25/2022    Lab Results  Component Value Date   WBC 5.3 12/13/2022   HGB 14.1 12/13/2022   HCT 41.2 12/13/2022   PLT 210.0 12/13/2022   GLUCOSE 99 11/20/2022   CHOL 152 12/13/2022   TRIG 142.0 12/13/2022   HDL 47.80 12/13/2022   LDLDIRECT 85.0 12/13/2022   LDLCALC 76 12/13/2022   ALT 29 11/20/2022   AST 34 11/20/2022   NA 137 11/20/2022   K 3.3 (L) 11/20/2022   CL 106 11/20/2022   CREATININE 0.99 11/20/2022   BUN 18 11/20/2022   CO2 23 11/20/2022   TSH 1.36 12/13/2022   PSA 5.83 (H) 12/13/2022   HGBA1C 5.9 12/13/2022   MICROALBUR <0.7 05/25/2022    CT ABDOMEN PELVIS W CONTRAST  Result Date: 11/20/2022 CLINICAL DATA:  Left lower quadrant pain since Saturday. EXAM: CT ABDOMEN AND PELVIS WITH CONTRAST TECHNIQUE: Multidetector CT imaging of the abdomen and pelvis was performed using the standard protocol following bolus administration of intravenous contrast. RADIATION DOSE REDUCTION: This exam was performed according to the departmental dose-optimization program which includes automated exposure control, adjustment of the mA and/or kV according to patient size and/or use of iterative reconstruction technique. CONTRAST:  OMNIPAQUE IOHEXOL 300 MG/ML  SOLN COMPARISON:  None available. FINDINGS: Lower chest: Mild bibasilar volume loss. Heart is at the upper limits of normal in size to mildly enlarged. No pericardial or pleural effusion. Distal esophagus is grossly unremarkable. Hepatobiliary: Liver and gallbladder are unremarkable. No biliary ductal dilatation. Pancreas: Negative. Spleen: Negative. Adrenals/Urinary Tract: Adrenal glands are unremarkable. Small low-attenuation lesions in the lower pole right kidney, too small to characterize. No specific follow-up necessary. Kidneys are otherwise unremarkable. Ureters are decompressed. Bladder is grossly unremarkable. Stomach/Bowel: Stomach, small bowel, appendix and majority of the colon are  unremarkable. Haziness and stranding and probable mild fat necrosis adjacent to the distal descending colon. No extraluminal air or organized fluid collection. Vascular/Lymphatic: Atherosclerotic calcification of the aorta. No pathologically enlarged lymph nodes. Reproductive: Prostate is enlarged. Other: Small bilateral inguinal hernias contain fat. No free fluid. Mesenteries and peritoneum are otherwise unremarkable. Left hemidiaphragm is elevated. Musculoskeletal: Degenerative changes in the spine. No worrisome lytic or sclerotic lesions. IMPRESSION: 1. Inflammatory haziness and stranding adjacent to the distal descending colon may be due to acute diverticulitis. Presence of fat necrosis raises the alternative possibility of epiploic appendagitis. 2. Enlarged prostate. 3.  Aortic atherosclerosis (ICD10-I70.0). Electronically Signed   By: Leanna Battles M.D.   On: 11/20/2022 17:49    Assessment & Plan:  .History of gout  Prostate cancer screening -     PSA, Medicare  Mixed hyperlipidemia Assessment & Plan: At goal on atorvastatin 80 mg . No changes today  Lab Results  Component Value Date   CHOL 152 12/13/2022  HDL 47.80 12/13/2022   LDLCALC 76 12/13/2022   LDLDIRECT 85.0 12/13/2022   TRIG 142.0 12/13/2022   CHOLHDL 3 12/13/2022    Orders: -     CBC with Differential/Platelet -     LDL cholesterol, direct -     Lipid panel  Fatigue, unspecified type -     TSH  Impaired fasting glucose -     Hemoglobin A1c  Elevated PSA, less than 10 ng/ml Assessment & Plan: He was referred to urology in Oct 2022 for PSA  of 9,  repeat PSA's by urology trended down to normal  by Oct 2023 so no biopsy was done.   repeat PSA is nearly doubled.  Lab Results  Component Value Date   PSA1 3.0 02/22/2021   PSA1 6.5 (H) 11/23/2020   PSA 5.83 (H) 12/13/2022   PSA 3.06 11/16/2021   PSA 9.51 (H) 11/15/2020       Incontinence of feces, unspecified fecal incontinence type Assessment &  Plan: He reports painless intermittent rectal leakage on NON FECAL MATERIAL.  Colonoscopy is scheduled    Diverticulitis Assessment & Plan: Suggested by CT scan done in ER,  resolved clinically with empiric abx (Septra DS and metronidazole)    Erectile dysfunction, unspecified erectile dysfunction type Assessment & Plan: Managed with sildenafil    Hepatic steatosis Assessment & Plan: Presumed by ultrasound changes and serologies negative for autoimmune causes of hepatitis in November 2021.  He was offered referral to liver specialist at time of workup but declined.  He has been following a low GI diet  Reducing alcohol use, , exercising and taking meds  But did not take the prescribed metformin. Marland KitchenLFTs are normal   Lab Results  Component Value Date   ALT 29 11/20/2022   AST 34 11/20/2022   ALKPHOS 83 11/20/2022   BILITOT 1.2 11/20/2022      Primary hypertension Assessment & Plan: he reports compliance with medication regimen  but has an elevated reading today in office.  He has been asked to check his BP at work and  submit readings for evaluation. Renal function will be checked today   Lab Results  Component Value Date   CREATININE 0.99 11/20/2022   Lab Results  Component Value Date   NA 137 11/20/2022   K 3.3 (L) 11/20/2022   CL 106 11/20/2022   CO2 23 11/20/2022      Diuretic-induced hypokalemia  Other orders -     amLODIPine Besylate; Take 1 tablet (5 mg total) by mouth daily.  Dispense: 90 tablet; Refill: 1 -     Atorvastatin Calcium; Take 1 tablet (80 mg total) by mouth daily.  Dispense: 90 tablet; Refill: 1 -     hydroCHLOROthiazide; Take 1 tablet (25 mg total) by mouth daily.  Dispense: 90 tablet; Refill: 1 -     Telmisartan; Take 1 tablet (80 mg total) by mouth daily.  Dispense: 90 tablet; Refill: 1 -     Colchicine; TAKE 2 TABLETS BY MOUTH IMMEDIATELY THEN TAKE 1 TABLET TWICE DAILY FOR  THE  DURATION  OF  THE  FLARE  UP  TO  A  MAXIMUM  OF  7  Dispense: 9  tablet; Refill: 5 -     Potassium Chloride Crys ER; Take 1 tablet (20 mEq total) by mouth daily.  Dispense: 30 tablet; Refill: 3     I provided 30 minutes of face-to-face time during this encounter reviewing patient's last visit with me, patient's  most recent  visit with cardiology,  nephrology,  and neurology,  recent surgical and non surgical procedures, previous  labs and imaging studies, counseling on currently addressed issues,  and post visit ordering to diagnostics and therapeutics .   Follow-up: No follow-ups on file.   Sherlene Shams, MD

## 2022-12-13 NOTE — Assessment & Plan Note (Signed)
Suggested by CT scan done in ER,  resolved clinically with empiric abx (Septra DS and metronidazole)

## 2022-12-13 NOTE — Assessment & Plan Note (Signed)
he reports compliance with medication regimen  but has an elevated reading today in office.  He has been asked to check his BP at work and  submit readings for evaluation. Renal function will be checked today   Lab Results  Component Value Date   CREATININE 0.99 11/20/2022   Lab Results  Component Value Date   NA 137 11/20/2022   K 3.3 (L) 11/20/2022   CL 106 11/20/2022   CO2 23 11/20/2022

## 2022-12-13 NOTE — Assessment & Plan Note (Addendum)
He was referred to urology in Oct 2022 for PSA  of 9,  repeat PSA's by urology trended down to normal  by Oct 2023 so no biopsy was done.   repeat PSA is nearly doubled.  Lab Results  Component Value Date   PSA1 3.0 02/22/2021   PSA1 6.5 (H) 11/23/2020   PSA 5.83 (H) 12/13/2022   PSA 3.06 11/16/2021   PSA 9.51 (H) 11/15/2020

## 2022-12-13 NOTE — Assessment & Plan Note (Signed)
Managed with sildenafil

## 2022-12-25 ENCOUNTER — Encounter: Payer: Self-pay | Admitting: Gastroenterology

## 2022-12-26 ENCOUNTER — Ambulatory Visit
Admission: RE | Admit: 2022-12-26 | Discharge: 2022-12-26 | Disposition: A | Discharge status: Home / Self Care | Payer: Medicare Other | Attending: Gastroenterology | Admitting: Gastroenterology

## 2022-12-26 ENCOUNTER — Ambulatory Visit: Payer: Medicare Other | Admitting: Anesthesiology

## 2022-12-26 ENCOUNTER — Encounter
Admission: RE | Disposition: A | Discharge status: Home / Self Care | Payer: Self-pay | Source: Home / Self Care | Attending: Gastroenterology

## 2022-12-26 ENCOUNTER — Encounter: Payer: Self-pay | Admitting: Gastroenterology

## 2022-12-26 ENCOUNTER — Other Ambulatory Visit: Payer: Self-pay

## 2022-12-26 DIAGNOSIS — Z87891 Personal history of nicotine dependence: Secondary | ICD-10-CM | POA: Diagnosis not present

## 2022-12-26 DIAGNOSIS — Z860100 Personal history of colon polyps, unspecified: Secondary | ICD-10-CM

## 2022-12-26 DIAGNOSIS — G4730 Sleep apnea, unspecified: Secondary | ICD-10-CM | POA: Diagnosis not present

## 2022-12-26 DIAGNOSIS — K5730 Diverticulosis of large intestine without perforation or abscess without bleeding: Secondary | ICD-10-CM | POA: Insufficient documentation

## 2022-12-26 DIAGNOSIS — Z1211 Encounter for screening for malignant neoplasm of colon: Secondary | ICD-10-CM | POA: Insufficient documentation

## 2022-12-26 DIAGNOSIS — D123 Benign neoplasm of transverse colon: Secondary | ICD-10-CM | POA: Insufficient documentation

## 2022-12-26 DIAGNOSIS — I10 Essential (primary) hypertension: Secondary | ICD-10-CM | POA: Insufficient documentation

## 2022-12-26 DIAGNOSIS — D126 Benign neoplasm of colon, unspecified: Secondary | ICD-10-CM | POA: Diagnosis present

## 2022-12-26 DIAGNOSIS — K635 Polyp of colon: Secondary | ICD-10-CM | POA: Diagnosis not present

## 2022-12-26 HISTORY — PX: COLONOSCOPY WITH PROPOFOL: SHX5780

## 2022-12-26 HISTORY — PX: POLYPECTOMY: SHX5525

## 2022-12-26 SURGERY — COLONOSCOPY WITH PROPOFOL
Anesthesia: General

## 2022-12-26 MED ORDER — PROPOFOL 500 MG/50ML IV EMUL
INTRAVENOUS | Status: DC | PRN
  Administered 2022-12-26: 150 ug/kg/min via INTRAVENOUS

## 2022-12-26 MED ORDER — PROPOFOL 10 MG/ML IV BOLUS
INTRAVENOUS | Status: DC | PRN
  Administered 2022-12-26: 90 mg via INTRAVENOUS

## 2022-12-26 MED ORDER — SODIUM CHLORIDE 0.9 % IV SOLN: INTRAVENOUS | Status: DC

## 2022-12-26 MED ORDER — LIDOCAINE HCL (CARDIAC) PF 100 MG/5ML IV SOSY
PREFILLED_SYRINGE | INTRAVENOUS | Status: DC | PRN
  Administered 2022-12-26: 100 mg via INTRAVENOUS

## 2022-12-26 NOTE — Anesthesia Postprocedure Evaluation (Signed)
Anesthesia Post Note  Patient: Walter Osborne  Procedure(s) Performed: COLONOSCOPY WITH PROPOFOL POLYPECTOMY  Patient location during evaluation: Endoscopy Anesthesia Type: General Level of consciousness: awake and alert Pain management: pain level controlled Vital Signs Assessment: post-procedure vital signs reviewed and stable Respiratory status: spontaneous breathing, nonlabored ventilation, respiratory function stable and patient connected to nasal cannula oxygen Cardiovascular status: blood pressure returned to baseline and stable Postop Assessment: no apparent nausea or vomiting Anesthetic complications: no   No notable events documented.   Last Vitals:  Vitals:   12/26/22 0908 12/26/22 0917  BP: 120/82 133/78  Pulse: (!) 59 (!) 59  Resp: 14 12  Temp:  (!) 35.4 C  SpO2: 98% 99%    Last Pain:  Vitals:   12/26/22 0917  TempSrc: Temporal  PainSc: 0-No pain                 Louie Boston

## 2022-12-26 NOTE — Op Note (Signed)
[facility] Patient Name: Walter Osborne Procedure Date: 12/26/2022 8:27 AM MRN: [id number] Account #: [id number] Date of Birth: July 29, 1955 Admit Type: Outpatient Age: 67 Room: [facility] ENDO ROOM 2 Gender: Male Note Status: Finalized Instrument Name: Prentice Docker [id number] Procedure:             Colonoscopy Indications:           Surveillance: Personal history of adenomatous polyps                         on last colonoscopy 5 years ago Providers:             [name] MD, MD Referring MD:          Duncan Dull, MD (Referring MD) Medicines:             Monitored Anesthesia Care Complications:         No immediate complications. Procedure:             Pre-Anesthesia Assessment:                        - Prior to the procedure, a History and Physical was                         performed, and patient medications, allergies and                         sensitivities were reviewed. The patient's tolerance                         of previous anesthesia was reviewed.                        - The risks and benefits of the procedure and the                         sedation options and risks were discussed with the                         patient. All questions were answered and informed                         consent was obtained.                        - ASA Grade Assessment: II - A patient with mild                         systemic disease.                        After obtaining informed consent, the colonoscope was                         passed under direct vision. Throughout the procedure,                         the patient's blood pressure, pulse, and oxygen                         saturations  were monitored continuously. The                         Colonoscope was introduced through the anus and                         advanced to the the cecum, identified by the                         appendiceal orifice. The colonoscopy was performed                          with ease. The patient tolerated the procedure well.                         The quality of the bowel preparation was excellent.                         The ileocecal valve, appendiceal orifice, and rectum                         were photographed. Findings:      The perianal and digital rectal examinations were normal.      A 5 mm polyp was found in the transverse colon. The polyp was sessile.       The polyp was removed with a cold snare. Resection and retrieval were       complete.      The exam was otherwise without abnormality on direct and retroflexion       views.      Multiple medium-mouthed diverticula were found in the sigmoid colon. Impression:            - One 5 mm polyp in the transverse colon, removed with                         a cold snare. Resected and retrieved.                        - The examination was otherwise normal on direct and                         retroflexion views. Recommendation:        - Discharge patient to home (with escort).                        - Resume previous diet.                        - Continue present medications.                        - Await pathology results.                        - Repeat colonoscopy in 7 years for surveillance. Procedure Code(s):     --- Professional ---                        [phone], Colonoscopy, flexible; with removal of  tumor(s), polyp(s), or other lesion(s) by snare                         technique Diagnosis Code(s):     --- Professional ---                        Z86.010, Personal history of colonic polyps                        D12.3, Benign neoplasm of transverse colon (hepatic                         flexure or splenic flexure) CPT copyright 2022 [employer]. All rights reserved. The codes documented in this report are preliminary and upon coder review may  be revised to meet current compliance requirements. [name], MD [name] MD, MD 12/26/2022  8:57:37 AM This report has been signed electronically. Number of Addenda: 0 Note Initiated On: 12/26/2022 8:27 AM Scope Withdrawal Time: 0 hours 12 minutes 30 seconds  Total Procedure Duration: 0 hours 16 minutes 5 seconds  Estimated Blood Loss:  Estimated blood loss: none.      [facility]

## 2022-12-26 NOTE — Anesthesia Preprocedure Evaluation (Signed)
Anesthesia Evaluation  Patient identified by MRN, date of birth, ID band Patient awake    Reviewed: Allergy & Precautions, NPO status , Patient's Chart, lab work & pertinent test results  History of Anesthesia Complications Negative for: history of anesthetic complications  Airway Mallampati: II  TM Distance: >3 FB Neck ROM: full    Dental no notable dental hx.    Pulmonary sleep apnea , former smoker   Pulmonary exam normal        Cardiovascular hypertension, negative cardio ROS Normal cardiovascular exam     Neuro/Psych negative neurological ROS  negative psych ROS   GI/Hepatic negative GI ROS, Neg liver ROS,,,  Endo/Other  negative endocrine ROS    Renal/GU negative Renal ROS  negative genitourinary   Musculoskeletal   Abdominal   Peds  Hematology negative hematology ROS (+)   Anesthesia Other Findings Past Medical History: No date: Hyperlipidemia No date: Hypertension No date: Sleep apnea  Past Surgical History: No date: COLONOSCOPY 12/25/2019: COLONOSCOPY WITH PROPOFOL; N/A     Comment:  Procedure: COLONOSCOPY WITH PROPOFOL;  Surgeon: [name], MD;  Location: [facility] ENDOSCOPY;  Service:               Gastroenterology;  Laterality: N/A;     Reproductive/Obstetrics negative OB ROS                             Anesthesia Physical Anesthesia Plan  ASA: 3  Anesthesia Plan: General   Post-op Pain Management: Minimal or no pain anticipated   Induction: Intravenous  PONV Risk Score and Plan: 1 and Propofol infusion and TIVA  Airway Management Planned: Natural Airway and Nasal Cannula  Additional Equipment:   Intra-op Plan:   Post-operative Plan:   Informed Consent: I have reviewed the patients History and Physical, chart, labs and discussed the procedure including the risks, benefits and alternatives for the proposed anesthesia with the patient or  authorized representative who has indicated his/her understanding and acceptance.     Dental Advisory Given  Plan Discussed with: Anesthesiologist, CRNA and Surgeon  Anesthesia Plan Comments: (Patient consented for risks of anesthesia including but not limited to:  - adverse reactions to medications - risk of airway placement if required - damage to eyes, teeth, lips or other oral mucosa - nerve damage due to positioning  - sore throat or hoarseness - Damage to heart, brain, nerves, lungs, other parts of body or loss of life  Patient voiced understanding and assent.)       Anesthesia Quick Evaluation

## 2022-12-26 NOTE — H&P (Signed)
[name], MD [address], [address], [place], [place], [address] [address], [place], [place], [address] Phone: [phone]  Fax: [phone]  Primary Care Physician:  Sherlene Shams, MD   Pre-Procedure History & Physical: HPI:  Walter Osborne is a 67 y.o. male is here for an colonoscopy.   Past Medical History:  Diagnosis Date   Hyperlipidemia    Hypertension    Sleep apnea     Past Surgical History:  Procedure Laterality Date   COLONOSCOPY     COLONOSCOPY WITH PROPOFOL N/A 12/25/2019   Procedure: COLONOSCOPY WITH PROPOFOL;  Surgeon: [name], MD;  Location: [facility] ENDOSCOPY;  Service: Gastroenterology;  Laterality: N/A;    Prior to Admission medications   Medication Sig Start Date End Date Taking? Authorizing Provider  amLODipine (NORVASC) 5 MG tablet Take 1 tablet (5 mg total) by mouth daily. 12/13/22   Sherlene Shams, MD  aspirin EC 81 MG tablet Take 1 tablet (81 mg total) by mouth daily. Swallow whole. 01/05/20   Debbe Odea, MD  atorvastatin (LIPITOR) 80 MG tablet Take 1 tablet (80 mg total) by mouth daily. 12/13/22   Sherlene Shams, MD  colchicine 0.6 MG tablet TAKE 2 TABLETS BY MOUTH IMMEDIATELY THEN TAKE 1 TABLET TWICE DAILY FOR  THE  DURATION  OF  THE  FLARE  UP  TO  A  MAXIMUM  OF  7 12/13/22   Sherlene Shams, MD  fluticasone [facility]) 50 MCG/ACT nasal spray USE 2 SPRAYS EACH NOSTRIL DAILY 07/27/14   Sherlene Shams, MD  hydrochlorothiazide (HYDRODIURIL) 25 MG tablet Take 1 tablet (25 mg total) by mouth daily. 12/13/22   Sherlene Shams, MD  Loratadine (ALAVERT PO) Take by mouth. 1 dissolvable tablet as needed daily for allergies.     [provider]  PEG 3350-KCl-NaBcb-NaCl-NaSulf (PEG-3350/ELECTROLYTES) 236 g SOLR Take 4,000 mLs by mouth once. Patient not taking: Reported on 12/13/2022 10/30/22   [provider]  potassium chloride SA (KLOR-CON M) 20 MEQ tablet Take 1 tablet (20 mEq total) by mouth daily. 12/13/22    Sherlene Shams, MD  telmisartan (MICARDIS) 80 MG tablet Take 1 tablet (80 mg total) by mouth daily. 12/13/22   Sherlene Shams, MD  zolpidem (AMBIEN) 10 MG tablet Take 1 tablet (10 mg total) by mouth at bedtime as needed. for sleep 10/31/22   Sherlene Shams, MD    Allergies as of 10/31/2022   (Not on File)    Family History  Problem Relation Age of Onset   Hyperlipidemia Mother    Diabetes Father    CAD Father        MI at age 27   Cancer Maternal Aunt        Lung CA   Heart attack Maternal Grandfather 43    Social History   Socioeconomic History   Marital status: Married    Spouse name: Not on file   Number of children: Not on file   Years of education: Not on file   Highest education level: Associate degree: occupational, Scientist, product/process development, or vocational program  Occupational History    Employer: [employer]    Comment: travels,    Tobacco Use   Smoking status: Former    Current packs/day: 0.00    Types: Cigarettes    Start date: 02/14/1983    Quit date: 02/13/1993    Years since quitting: 29.8   Smokeless tobacco: Never  Vaping Use   Vaping  status: Never Used  Substance and Sexual Activity   Alcohol use: Yes    Alcohol/week: 4.0 standard drinks of alcohol    Types: 4 Glasses of wine per week   Drug use: No   Sexual activity: Not on file  Other Topics Concern   Not on file  Social History Narrative   Married   Social Determinants of Health   Financial Resource Strain: Low Risk  (12/07/2022)   Overall Financial Resource Strain (CARDIA)    Difficulty of Paying Living Expenses: Not hard at all  Food Insecurity: No Food Insecurity (12/07/2022)   Hunger Vital Sign    Worried About Running Out of Food in the Last Year: Never true    Ran Out of Food in the Last Year: Never true  Transportation Needs: No Transportation Needs (12/07/2022)   PRAPARE - Administrator, Civil Service (Medical): No    Lack of Transportation (Non-Medical): No  Physical  Activity: Insufficiently Active (12/07/2022)   Exercise Vital Sign    Days of Exercise per Week: 3 days    Minutes of Exercise per Session: 30 min  Stress: No Stress Concern Present (12/07/2022)   [employer] of Occupational Health - Occupational Stress Questionnaire    Feeling of Stress : Only a little  Recent Concern: Stress - Stress Concern Present (11/27/2022)   [employer] of Occupational Health - Occupational Stress Questionnaire    Feeling of Stress : To some extent  Social Connections: Socially Integrated (12/07/2022)   Social Connection and Isolation Panel [NHANES]    Frequency of Communication with Friends and Family: More than three times a week    Frequency of Social Gatherings with Friends and Family: Twice a week    Attends Religious Services: 1 to 4 times per year    Active Member of [employer] or Organizations: Yes    Attends Engineer, structural: More than 4 times per year    Marital Status: Married  Catering manager Violence: Not At Risk (11/28/2022)   Humiliation, Afraid, Rape, and Kick questionnaire    Fear of Current or Ex-Partner: No    Emotionally Abused: No    Physically Abused: No    Sexually Abused: No    Review of Systems: See HPI, otherwise negative ROS  Physical Exam: There were no vitals taken for this visit. General:   Alert,  pleasant and cooperative in NAD Head:  Normocephalic and atraumatic. Neck:  Supple; no masses or thyromegaly. Lungs:  Clear throughout to auscultation, normal respiratory effort.    Heart:  +S1, +S2, Regular rate and rhythm, No edema. Abdomen:  Soft, nontender and nondistended. Normal bowel sounds, without guarding, and without rebound.   Neurologic:  Alert and  oriented x4;  grossly normal neurologically.  Impression/Plan: Walter Osborne is here for an colonoscopy to be performed for surveillance due to prior history of colon polyps   Risks, benefits, limitations, and alternatives regarding   colonoscopy have been reviewed with the patient.  Questions have been answered.  All parties agreeable.   [name], MD  12/26/2022, 8:07 AM

## 2022-12-26 NOTE — Transfer of Care (Signed)
Immediate Anesthesia Transfer of Care Note  Patient: Walter Osborne  Procedure(s) Performed: COLONOSCOPY WITH PROPOFOL POLYPECTOMY  Patient Location: PACU  Anesthesia Type:General  Level of Consciousness: awake and alert   Airway & Oxygen Therapy: Patient Spontanous Breathing  Post-op Assessment: Report given to RN and Post -op Vital signs reviewed and stable  Post vital signs: Reviewed and stable  Last Vitals:  Vitals Value Taken Time  BP 110/70 12/26/22 0858  Temp    Pulse 61 12/26/22 0858  Resp 16 12/26/22 0858  SpO2 98 % 12/26/22 0858    Last Pain:  Vitals:   12/26/22 0858  TempSrc:   PainSc: Asleep         Complications: No notable events documented.

## 2022-12-27 ENCOUNTER — Encounter: Payer: Self-pay | Admitting: Gastroenterology

## 2022-12-28 LAB — SURGICAL PATHOLOGY

## 2023-01-01 ENCOUNTER — Encounter: Payer: Self-pay | Admitting: Gastroenterology

## 2023-02-12 ENCOUNTER — Encounter: Payer: Self-pay | Admitting: Internal Medicine

## 2023-02-12 ENCOUNTER — Telehealth: Payer: Self-pay

## 2023-02-12 NOTE — Telephone Encounter (Signed)
Error

## 2023-02-12 NOTE — Telephone Encounter (Signed)
LMTCB. Pt will need to schedule a virtual visit with a provider or be seen at an urgent care.

## 2023-02-26 DIAGNOSIS — Z23 Encounter for immunization: Secondary | ICD-10-CM | POA: Diagnosis not present

## 2023-02-26 DIAGNOSIS — J029 Acute pharyngitis, unspecified: Secondary | ICD-10-CM | POA: Diagnosis not present

## 2023-02-26 DIAGNOSIS — J018 Other acute sinusitis: Secondary | ICD-10-CM | POA: Diagnosis not present

## 2023-02-28 ENCOUNTER — Encounter: Payer: Self-pay | Admitting: Internal Medicine

## 2023-02-28 DIAGNOSIS — R972 Elevated prostate specific antigen [PSA]: Secondary | ICD-10-CM

## 2023-03-12 NOTE — Progress Notes (Unsigned)
03/13/2023 4:32 PM   Walter Osborne [birth year] [id number]  Referring provider: Sherlene Shams, MD [address] [place],  [place] [address]  Urological history: 1. Elevated PSA -PSA (11/2022) 5.83 -PSA (11/2020) 9.51  2. BPH with LU TS -prostate volume (CT 11/2022) ~ 70 cc   Chief Complaint  Patient presents with   Elevated PSA   HPI: Walter Osborne is a 68 y.o. male who presents today for elevated PSA.    Previous records reviewed.   He was last seen in our office in October 2022 with Dr. Apolinar Junes for an elevated PSA of 9.51.  Thankfully his PSA trended back down to baseline and he had been followed by his primary care physician until recently when his PSA started to increase again.  In October, it increased to 5.83.     He has not had any issues with urination.  He voids with a good strong stream.  He feels he is completely emptying his bladder.  Patient denies any modifying or aggravating factors.  Patient denies any recent UTI's, gross hematuria, dysuria or suprapubic/flank pain.  Patient denies any fevers, chills, nausea or vomiting.    He does not have any issues erectile dysfunction, but he states he is not sexually active as he has been in the past.  He  PMH: Past Medical History:  Diagnosis Date   Hyperlipidemia    Hypertension    Sleep apnea     Surgical History: Past Surgical History:  Procedure Laterality Date   COLONOSCOPY     COLONOSCOPY WITH PROPOFOL N/A 12/25/2019   Procedure: COLONOSCOPY WITH PROPOFOL;  Surgeon: [name], MD;  Location: [facility] ENDOSCOPY;  Service: Gastroenterology;  Laterality: N/A;   COLONOSCOPY WITH PROPOFOL N/A 12/26/2022   Procedure: COLONOSCOPY WITH PROPOFOL;  Surgeon: [name], MD;  Location: [facility] ENDOSCOPY;  Service: Gastroenterology;  Laterality: N/A;   POLYPECTOMY  12/26/2022   Procedure: POLYPECTOMY;  Surgeon: [name], MD;  Location: [facility] ENDOSCOPY;  Service: Gastroenterology;;     Home Medications:  Allergies as of 03/13/2023   No Known Allergies      Medication List        Accurate as of March 13, 2023  4:32 PM. If you have any questions, ask your nurse or doctor.          ALAVERT PO Take by mouth. 1 dissolvable tablet as needed daily for allergies.   amLODipine 5 MG tablet Commonly known as: NORVASC Take 1 tablet (5 mg total) by mouth daily.   aspirin EC 81 MG tablet Take 1 tablet (81 mg total) by mouth daily. Swallow whole.   atorvastatin 80 MG tablet Commonly known as: LIPITOR Take 1 tablet (80 mg total) by mouth daily.   colchicine 0.6 MG tablet TAKE 2 TABLETS BY MOUTH IMMEDIATELY THEN TAKE 1 TABLET TWICE DAILY FOR  THE  DURATION  OF  THE  FLARE  UP  TO  A  MAXIMUM  OF  7   fluticasone 50 MCG/ACT nasal spray Commonly known as: FLONASE USE 2 SPRAYS EACH NOSTRIL DAILY   hydrochlorothiazide 25 MG tablet Commonly known as: HYDRODIURIL Take 1 tablet (25 mg total) by mouth daily.   PEG-3350/Electrolytes 236 g Solr Take 4,000 mLs by mouth once.   potassium chloride SA 20 MEQ tablet Commonly known as: KLOR-CON M Take 1 tablet (20 mEq total) by mouth daily.   telmisartan 80 MG tablet Commonly known as: MICARDIS Take 1 tablet (80 mg total) by  mouth daily.   zolpidem 10 MG tablet Commonly known as: AMBIEN Take 1 tablet (10 mg total) by mouth at bedtime as needed. for sleep        Allergies: No Known Allergies  Family History: Family History  Problem Relation Age of Onset   Hyperlipidemia Mother    Diabetes Father    CAD Father        MI at age 77   Cancer Maternal Aunt        Lung CA   Heart attack Maternal Grandfather 60    Social History:  reports that he quit smoking about 30 years ago. His smoking use included cigarettes. He started smoking about 40 years ago. He has never used smokeless tobacco. He reports current alcohol use of about 4.0 standard drinks of alcohol per week. He reports that he does not use  drugs.  ROS: Pertinent ROS in HPI  Physical Exam: BP 113/71   Pulse 73   Ht 6\' 1"  (1.854 m)   Wt 238 lb (108 kg)   BMI 31.40 kg/m   Constitutional:  Well nourished. Alert and oriented, No acute distress. HEENT: [place] AT, moist mucus membranes.  Trachea midline, no masses. Cardiovascular: No clubbing, cyanosis, or edema. Respiratory: Normal respiratory effort, no increased work of breathing. GU: No CVA tenderness.  No bladder fullness or masses.  Patient with circumcised phallus.  Urethral meatus is patent.  No penile discharge. No penile lesions or rashes. Scrotum without lesions, cysts, rashes and/or edema.  Testicles are located scrotally bilaterally. No masses are appreciated in the testicles. Left and right epididymis are normal. Rectal: Patient with  normal sphincter tone. Anus and perineum without scarring or rashes. No rectal masses are appreciated. Prostate is approximately 50 + grams, rubbery lateral lobes, no nodules are appreciated. Seminal vesicles could not be palpated  Neurologic: Grossly intact, no focal deficits, moving all 4 extremities. Psychiatric: Normal mood and affect.  Laboratory Data: Lab Results  Component Value Date   WBC 5.3 12/13/2022   HGB 14.1 12/13/2022   HCT 41.2 12/13/2022   MCV 93.3 12/13/2022   PLT 210.0 12/13/2022    Lab Results  Component Value Date   CREATININE 0.99 11/20/2022    Lab Results  Component Value Date   PSA 5.83 (H) 12/13/2022   PSA 3.06 11/16/2021   PSA 9.51 (H) 11/15/2020    Lab Results  Component Value Date   HGBA1C 5.9 12/13/2022    Lab Results  Component Value Date   TSH 1.36 12/13/2022       Component Value Date/Time   CHOL 152 12/13/2022 0935   HDL 47.80 12/13/2022 0935   CHOLHDL 3 12/13/2022 0935   VLDL 28.4 12/13/2022 0935   LDLCALC 76 12/13/2022 0935    Lab Results  Component Value Date   AST 34 11/20/2022   Lab Results  Component Value Date   ALT 29 11/20/2022   I have reviewed the  labs.   Pertinent Imaging: CLINICAL DATA:  Left lower quadrant pain since Saturday.   EXAM: CT ABDOMEN AND PELVIS WITH CONTRAST   TECHNIQUE: Multidetector CT imaging of the abdomen and pelvis was performed using the standard protocol following bolus administration of intravenous contrast.   RADIATION DOSE REDUCTION: This exam was performed according to the departmental dose-optimization program which includes automated exposure control, adjustment of the mA and/or kV according to patient size and/or use of iterative reconstruction technique.   CONTRAST:  OMNIPAQUE IOHEXOL 300 MG/ML  SOLN  COMPARISON:  None available.   FINDINGS: Lower chest: Mild bibasilar volume loss. Heart is at the upper limits of normal in size to mildly enlarged. No pericardial or pleural effusion. Distal esophagus is grossly unremarkable.   Hepatobiliary: Liver and gallbladder are unremarkable. No biliary ductal dilatation.   Pancreas: Negative.   Spleen: Negative.   Adrenals/Urinary Tract: Adrenal glands are unremarkable. Small low-attenuation lesions in the lower pole right kidney, too small to characterize. No specific follow-up necessary. Kidneys are otherwise unremarkable. Ureters are decompressed. Bladder is grossly unremarkable.   Stomach/Bowel: Stomach, small bowel, appendix and majority of the colon are unremarkable. Haziness and stranding and probable mild fat necrosis adjacent to the distal descending colon. No extraluminal air or organized fluid collection.   Vascular/Lymphatic: Atherosclerotic calcification of the aorta. No pathologically enlarged lymph nodes.   Reproductive: Prostate is enlarged.   Other: Small bilateral inguinal hernias contain fat. No free fluid. Mesenteries and peritoneum are otherwise unremarkable. Left hemidiaphragm is elevated.   Musculoskeletal: Degenerative changes in the spine. No worrisome lytic or sclerotic lesions.   IMPRESSION: 1.  Inflammatory haziness and stranding adjacent to the distal descending colon may be due to acute diverticulitis. Presence of fat necrosis raises the alternative possibility of epiploic appendagitis. 2. Enlarged prostate. 3.  Aortic atherosclerosis (ICD10-I70.0).     Electronically Signed   By: Leanna Battles M.D.   On: 11/20/2022 17:49   I have independently reviewed the films.    Assessment & Plan:    1. Elevated PSA -Obtained a free and total PSA today -Explained a lot of times the PSA can vacillate especially in the setting of a large prostate -If in the unfortunate event it does continue to increase in value, we will need to consider the possibility of prostate cancer and either obtain a prostate MRI for further stratification or pursue prostate biopsy, but we will discuss this more once the results are available tomorrow  2. BPH with LU TS -Continue to manage conservatively  3. ED -He is currently not sexually active  Return for pending free and total PSA result .  These notes generated with voice recognition software. I apologize for typographical errors.  Cloretta Ned  [facility] [address]  [address] [place], [place] [address] [phone]

## 2023-03-13 ENCOUNTER — Ambulatory Visit: Payer: Medicare Other | Admitting: Urology

## 2023-03-13 ENCOUNTER — Encounter: Payer: Self-pay | Admitting: Urology

## 2023-03-13 VITALS — BP 113/71 | HR 73 | Ht 73.0 in | Wt 238.0 lb

## 2023-03-13 DIAGNOSIS — N401 Enlarged prostate with lower urinary tract symptoms: Secondary | ICD-10-CM | POA: Diagnosis not present

## 2023-03-13 DIAGNOSIS — N529 Male erectile dysfunction, unspecified: Secondary | ICD-10-CM | POA: Diagnosis not present

## 2023-03-13 DIAGNOSIS — R972 Elevated prostate specific antigen [PSA]: Secondary | ICD-10-CM | POA: Diagnosis not present

## 2023-03-13 DIAGNOSIS — N138 Other obstructive and reflux uropathy: Secondary | ICD-10-CM

## 2023-03-14 LAB — PSA TOTAL (REFLEX TO FREE): Prostate Specific Ag, Serum: 5.4 ng/mL — ABNORMAL HIGH (ref 0.0–4.0)

## 2023-03-14 LAB — FPSA% REFLEX
% FREE PSA: 20.6 %
PSA, FREE: 1.11 ng/mL

## 2023-04-08 ENCOUNTER — Other Ambulatory Visit: Payer: Self-pay | Admitting: Urology

## 2023-04-08 ENCOUNTER — Telehealth: Payer: Self-pay | Admitting: Urology

## 2023-04-08 DIAGNOSIS — R972 Elevated prostate specific antigen [PSA]: Secondary | ICD-10-CM

## 2023-04-08 NOTE — Telephone Encounter (Signed)
 I was communicating with Walter Osborne through MyChart and we need to get him scheduled to have a repeat free and total PSA in early April.  Would you call him and get that lab appointment scheduled?

## 2023-04-10 ENCOUNTER — Other Ambulatory Visit: Payer: Self-pay

## 2023-04-10 NOTE — Telephone Encounter (Signed)
 Lab appt made 4/2 130pm.   Order in per -SM

## 2023-04-11 ENCOUNTER — Encounter: Payer: Self-pay | Admitting: Internal Medicine

## 2023-04-11 ENCOUNTER — Other Ambulatory Visit: Payer: Self-pay | Admitting: Internal Medicine

## 2023-04-11 MED ORDER — PREDNISONE 10 MG PO TABS: ORAL_TABLET | ORAL | 0 refills | Status: DC

## 2023-04-17 ENCOUNTER — Ambulatory Visit (INDEPENDENT_AMBULATORY_CARE_PROVIDER_SITE_OTHER): Payer: Medicare Other | Admitting: Internal Medicine

## 2023-04-17 ENCOUNTER — Ambulatory Visit

## 2023-04-17 ENCOUNTER — Encounter: Payer: Self-pay | Admitting: Internal Medicine

## 2023-04-17 VITALS — BP 128/68 | HR 65 | Ht 73.0 in | Wt 243.2 lb

## 2023-04-17 DIAGNOSIS — Z8739 Personal history of other diseases of the musculoskeletal system and connective tissue: Secondary | ICD-10-CM | POA: Diagnosis not present

## 2023-04-17 DIAGNOSIS — M7989 Other specified soft tissue disorders: Secondary | ICD-10-CM | POA: Diagnosis not present

## 2023-04-17 DIAGNOSIS — M10071 Idiopathic gout, right ankle and foot: Secondary | ICD-10-CM | POA: Diagnosis not present

## 2023-04-17 LAB — C-REACTIVE PROTEIN: CRP: <1 mg/dL (ref 0.5–20.0)

## 2023-04-17 LAB — URIC ACID: Uric Acid, Serum: 7.3 mg/dL (ref 4.0–7.8)

## 2023-04-17 NOTE — Progress Notes (Unsigned)
 Subjective:  Patient ID: Walter Osborne, male    DOB: 05-13-1955  Age: 68 y.o. MRN: [id number]  CC: There were no encounter diagnoses.   HPI Walter Osborne presents for  Chief Complaint  Patient presents with   Medical Management of Chronic Issues    Follow up on gout   Follow up on recent gout flare.  Patient sent a mychart message on Feb 26 requesting alternative treatment for gout as colchicine was not working .  Until recently he was averaging  2 attack per year.   But in the past 2 months he has had 2  attacks. Right ankle.  Gets hot swollen and exquisitely painful. Never started the prednisone because he did not read his my chart message   Outpatient Medications Prior to Visit  Medication Sig Dispense Refill   amLODipine (NORVASC) 5 MG tablet Take 1 tablet (5 mg total) by mouth daily. 90 tablet 1   aspirin EC 81 MG tablet Take 1 tablet (81 mg total) by mouth daily. Swallow whole. 90 tablet 3   atorvastatin (LIPITOR) 80 MG tablet Take 1 tablet (80 mg total) by mouth daily. 90 tablet 1   colchicine 0.6 MG tablet TAKE 2 TABLETS BY MOUTH IMMEDIATELY THEN TAKE 1 TABLET TWICE DAILY FOR  THE  DURATION  OF  THE  FLARE  UP  TO  A  MAXIMUM  OF  7 9 tablet 5   fluticasone (FLONASE) 50 MCG/ACT nasal spray USE 2 SPRAYS EACH NOSTRIL DAILY 16 g 1   hydrochlorothiazide (HYDRODIURIL) 25 MG tablet Take 1 tablet (25 mg total) by mouth daily. 90 tablet 1   Loratadine (ALAVERT PO) Take by mouth. 1 dissolvable tablet as needed daily for allergies.      PEG 3350-KCl-NaBcb-NaCl-NaSulf (PEG-3350/ELECTROLYTES) 236 g SOLR Take 4,000 mLs by mouth once.     potassium chloride SA (KLOR-CON M) 20 MEQ tablet Take 1 tablet (20 mEq total) by mouth daily. 30 tablet 3   telmisartan (MICARDIS) 80 MG tablet Take 1 tablet (80 mg total) by mouth daily. 90 tablet 1   zolpidem (AMBIEN) 10 MG tablet Take 1 tablet (10 mg total) by mouth at bedtime as needed. for sleep 30 tablet 5   predniSONE (DELTASONE) 10 MG  tablet 6 tablets daily for 3 days, then reduce by 1 tablet daily until gone (Patient not taking: Reported on 04/17/2023) 33 tablet 0   No facility-administered medications prior to visit.    Review of Systems;  Patient denies headache, fevers, malaise, unintentional weight loss, skin rash, eye pain, sinus congestion and sinus pain, sore throat, dysphagia,  hemoptysis , cough, dyspnea, wheezing, chest pain, palpitations, orthopnea, edema, abdominal pain, nausea, melena, diarrhea, constipation, flank pain, dysuria, hematuria, urinary  Frequency, nocturia, numbness, tingling, seizures,  Focal weakness, Loss of consciousness,  Tremor, insomnia, depression, anxiety, and suicidal ideation.      Objective:  BP 128/68   Pulse 65   Ht 6\' 1"  (1.854 m)   Wt 243 lb 3.2 oz (110.3 kg)   SpO2 98%   BMI 32.09 kg/m   BP Readings from Last 3 Encounters:  04/17/23 128/68  03/13/23 113/71  12/26/22 133/78    Wt Readings from Last 3 Encounters:  04/17/23 243 lb 3.2 oz (110.3 kg)  03/13/23 238 lb (108 kg)  12/26/22 238 lb (108 kg)    Physical Exam  Lab Results  Component Value Date   HGBA1C 5.9 12/13/2022   HGBA1C 5.8 11/10/2021   HGBA1C 5.6  05/16/2021    Lab Results  Component Value Date   CREATININE 0.99 11/20/2022   CREATININE 1.13 07/12/2022   CREATININE 1.09 05/25/2022    Lab Results  Component Value Date   WBC 5.3 12/13/2022   HGB 14.1 12/13/2022   HCT 41.2 12/13/2022   PLT 210.0 12/13/2022   GLUCOSE 99 11/20/2022   CHOL 152 12/13/2022   TRIG 142.0 12/13/2022   HDL 47.80 12/13/2022   LDLDIRECT 85.0 12/13/2022   LDLCALC 76 12/13/2022   ALT 29 11/20/2022   AST 34 11/20/2022   NA 137 11/20/2022   K 3.3 (L) 11/20/2022   CL 106 11/20/2022   CREATININE 0.99 11/20/2022   BUN 18 11/20/2022   CO2 23 11/20/2022   TSH 1.36 12/13/2022   PSA 5.83 (H) 12/13/2022   HGBA1C 5.9 12/13/2022   MICROALBUR <0.7 05/25/2022    No results found.  Assessment & Plan:  .There are no  diagnoses linked to this encounter.   I spent 34 minutes on the day of this face to face encounter reviewing patient's  most recent visit with cardiology,  nephrology,  and neurology,  prior relevant surgical and non surgical procedures, recent  labs and imaging studies, counseling on weight management,  reviewing the assessment and plan with patient, and post visit ordering and reviewing of  diagnostics and therapeutics with patient  .   Follow-up: No follow-ups on file.   Sherlene Shams, MD

## 2023-04-17 NOTE — Patient Instructions (Signed)
 THE SWELLING ON THE "INSIDE OF YOUR ANKLE IS DUE TO "venous insufficiency" and is managed with compression socks   The gout may in time cause changes to to ankle bone.  I have ordered an x ray for further evaluation   Use the colchicine and prednisone (and tylenol 1000 mg every 6 hours) for next gout flare

## 2023-04-18 ENCOUNTER — Encounter: Payer: Self-pay | Admitting: Internal Medicine

## 2023-04-18 LAB — SEDIMENTATION RATE: Sed Rate: 10 mm/h (ref 0–20)

## 2023-04-18 NOTE — Assessment & Plan Note (Signed)
 Right lateral ankle is the primary joint for the last several attacks.   Checking uric acid level and plain films,  treating attacks with colchicine and prednisone taper going forward.

## 2023-04-25 ENCOUNTER — Other Ambulatory Visit: Payer: Self-pay | Admitting: Internal Medicine

## 2023-04-27 DIAGNOSIS — J029 Acute pharyngitis, unspecified: Secondary | ICD-10-CM | POA: Diagnosis not present

## 2023-04-27 DIAGNOSIS — I10 Essential (primary) hypertension: Secondary | ICD-10-CM | POA: Diagnosis not present

## 2023-04-27 DIAGNOSIS — Z1152 Encounter for screening for COVID-19: Secondary | ICD-10-CM | POA: Diagnosis not present

## 2023-05-07 ENCOUNTER — Encounter: Payer: Self-pay | Admitting: Internal Medicine

## 2023-05-10 MED ORDER — ZOLPIDEM TARTRATE 10 MG PO TABS: 10.0000 mg | ORAL_TABLET | Freq: Every evening | ORAL | 5 refills | Status: DC | PRN

## 2023-05-14 DIAGNOSIS — S63602A Unspecified sprain of left thumb, initial encounter: Secondary | ICD-10-CM | POA: Diagnosis not present

## 2023-05-16 ENCOUNTER — Other Ambulatory Visit: Payer: Medicare Other

## 2023-05-16 DIAGNOSIS — R972 Elevated prostate specific antigen [PSA]: Secondary | ICD-10-CM

## 2023-05-17 LAB — PSA, TOTAL AND FREE
PSA, Free Pct: 23.9 %
PSA, Free: 0.86 ng/mL
Prostate Specific Ag, Serum: 3.6 ng/mL (ref 0.0–4.0)

## 2023-05-23 ENCOUNTER — Other Ambulatory Visit: Payer: Self-pay | Admitting: Internal Medicine

## 2023-05-24 ENCOUNTER — Other Ambulatory Visit: Payer: Self-pay | Admitting: Internal Medicine

## 2023-05-24 DIAGNOSIS — E876 Hypokalemia: Secondary | ICD-10-CM

## 2023-05-24 NOTE — Telephone Encounter (Signed)
 Refilled: 04/25/2023 Last OV: 04/17/2023 Next OV: not scheduled  Last potassium level on 11/20/2022 was 3.3

## 2023-05-24 NOTE — Telephone Encounter (Signed)
 Does pt need to have labs done soon or should he wait until he is due for everything else?

## 2023-06-05 ENCOUNTER — Other Ambulatory Visit: Payer: Self-pay | Admitting: Internal Medicine

## 2023-06-05 DIAGNOSIS — S63602A Unspecified sprain of left thumb, initial encounter: Secondary | ICD-10-CM | POA: Diagnosis not present

## 2023-06-25 ENCOUNTER — Encounter: Payer: Self-pay | Admitting: Internal Medicine

## 2023-06-27 ENCOUNTER — Other Ambulatory Visit: Payer: Self-pay | Admitting: Internal Medicine

## 2023-07-13 ENCOUNTER — Other Ambulatory Visit: Payer: Self-pay | Admitting: Internal Medicine

## 2023-07-20 ENCOUNTER — Other Ambulatory Visit: Payer: Self-pay | Admitting: Internal Medicine

## 2023-07-26 ENCOUNTER — Other Ambulatory Visit: Payer: Self-pay | Admitting: Internal Medicine

## 2023-08-05 ENCOUNTER — Other Ambulatory Visit: Payer: Self-pay | Admitting: Internal Medicine

## 2023-08-09 DIAGNOSIS — D2271 Melanocytic nevi of right lower limb, including hip: Secondary | ICD-10-CM | POA: Diagnosis not present

## 2023-08-09 DIAGNOSIS — D225 Melanocytic nevi of trunk: Secondary | ICD-10-CM | POA: Diagnosis not present

## 2023-08-09 DIAGNOSIS — D2261 Melanocytic nevi of right upper limb, including shoulder: Secondary | ICD-10-CM | POA: Diagnosis not present

## 2023-08-09 DIAGNOSIS — Z08 Encounter for follow-up examination after completed treatment for malignant neoplasm: Secondary | ICD-10-CM | POA: Diagnosis not present

## 2023-08-09 DIAGNOSIS — D2272 Melanocytic nevi of left lower limb, including hip: Secondary | ICD-10-CM | POA: Diagnosis not present

## 2023-08-09 DIAGNOSIS — D2262 Melanocytic nevi of left upper limb, including shoulder: Secondary | ICD-10-CM | POA: Diagnosis not present

## 2023-08-09 DIAGNOSIS — Z85828 Personal history of other malignant neoplasm of skin: Secondary | ICD-10-CM | POA: Diagnosis not present

## 2023-08-09 DIAGNOSIS — L570 Actinic keratosis: Secondary | ICD-10-CM | POA: Diagnosis not present

## 2023-08-21 ENCOUNTER — Encounter: Payer: Self-pay | Admitting: Internal Medicine

## 2023-08-21 NOTE — Telephone Encounter (Signed)
 Spoke to pt. Pt stated that he had been [place] his htcz for the past 2 weeks. Pt was able to pick up meds today and has started back taking. Next available appt was 08/23/23 but pt stated he would be [place]. Pt is scheduled to see Dr. Onesimo on 08/29/23. Informed pt if swelling gets worse or he develops any other symptoms to go to urgent care. Pt verbalized understanding

## 2023-08-21 NOTE — Telephone Encounter (Signed)
 Copied from CRM [id number]. Topic: Appointments - Scheduling Inquiry for Clinic >> Aug 21, 2023 12:40 PM Chasity T wrote: Reason for CRM: Patient is having swelling in both of his feet and ankles. He states that Dr Marylynn mentioned if this ever happens that he needs to be seen. She doesn't have anything until August but he needs to be seen this week. Please contact him regarding an appointment for this week if possible.

## 2023-08-27 ENCOUNTER — Other Ambulatory Visit: Payer: Self-pay | Admitting: Internal Medicine

## 2023-08-28 NOTE — Telephone Encounter (Signed)
 Refilled: 04/25/2023 Last OV: 12/13/2022 Next OV: not scheduled Potassium level: 3.3 on 10/7/22024

## 2023-08-29 ENCOUNTER — Ambulatory Visit (INDEPENDENT_AMBULATORY_CARE_PROVIDER_SITE_OTHER): Admitting: Internal Medicine

## 2023-08-29 ENCOUNTER — Ambulatory Visit
Admission: RE | Admit: 2023-08-29 | Discharge: 2023-08-29 | Disposition: A | Discharge status: Home / Self Care | Source: Ambulatory Visit | Attending: Internal Medicine | Admitting: Internal Medicine

## 2023-08-29 ENCOUNTER — Encounter: Payer: Self-pay | Admitting: Internal Medicine

## 2023-08-29 VITALS — BP 120/76 | HR 65 | Temp 98.1°F | Ht 73.0 in | Wt 240.2 lb

## 2023-08-29 DIAGNOSIS — R600 Localized edema: Secondary | ICD-10-CM | POA: Insufficient documentation

## 2023-08-29 DIAGNOSIS — I10 Essential (primary) hypertension: Secondary | ICD-10-CM

## 2023-08-29 NOTE — Addendum Note (Signed)
 Addended by: SEBASTIAN NORRIS A on: 08/29/2023 03:50 PM   Modules accepted: Orders

## 2023-08-29 NOTE — Assessment & Plan Note (Signed)
-   This problem is chronic and stable -Patient blood pressure today is well-controlled at 120/76 -Will continue with current medication including amlodipine  5 mg daily, hydrochlorothiazide  25 mg daily and telmisartan  80 mg daily -Patient did have significant bilateral lower extremity swelling over the last 2 weeks.  He states this occurred when he ran out of his hydrochlorothiazide  and has been slowly improving since restarting this medication. -No further workup at this time

## 2023-08-29 NOTE — Assessment & Plan Note (Signed)
-   Patient states that he developed significant bilateral lower extremity swelling approximately 2 weeks ago after being out of his hydrochlorothiazide  for about a week -He states that the swelling is significantly improved after restarting his hydrochlorothiazide  but still has some swelling -On exam, he has 1+ bilateral lower extremity pitting edema slightly worse on the left than the right.  No erythema or tenderness noted -I suspect he likely has chronic venous insufficiency causing his lower extremity edema which was exacerbated by being off his hydrochlorothiazide . -However, given that he does have a history of travel prior to the onset of swelling including a 3-hour car ride each way we will obtain bilateral lower extremity Dopplers to rule out a DVT -Patient is also on amlodipine  which can cause lower extremity swelling but he has been on this medication for a year without this side effect.  Will continue with this medication for now -Patient has no shortness of breath or cough making heart failure less likely.  However, we will check a BNP today for further evaluation -Will also check a CMP to assess renal and liver function to ensure that this is not a cause of his lower extremity swelling -No further workup at this time

## 2023-08-29 NOTE — Progress Notes (Signed)
 Acute Office Visit  Subjective:     Patient ID: Walter Osborne, male    DOB: 16-Nov-1955, 68 y.o.   MRN: [id number]  Chief Complaint  Patient presents with   Acute Visit    Swelling in bilateral feet & ankles started when the pharmacy could not get his HCTZ    HPI Patient is in today for lower extremity swelling over the last 2 weeks.  Patient states that he had run out of his hydrochlorothiazide  and after about a week noted bilateral lower extremity swelling.  He has since restarted his hydrochlorothiazide  and has had improvement in his swelling but still has some significant swelling in his legs.  Patient does state that he gets swelling around his ankles usually but this swelling was significantly worse extending up his leg and into his feet.  Patient does have a history of traveling prior to the swelling onset to the beach approximately 3 hours each way.  He denies any shortness of breath, orthopnea/PND.  No recent URI or other infections.  No rash.  No urinary complaints.  Patient with complaint of recent gout flare that is much improved currently on colchicine .  Review of Systems  Constitutional: Negative.   HENT: Negative.    Respiratory: Negative.  Negative for cough and shortness of breath.   Cardiovascular:  Positive for leg swelling. Negative for chest pain, orthopnea and PND.  Gastrointestinal: Negative.   Musculoskeletal:  Positive for joint pain.       Complains of recent gout flare in left ankle which is much improved now  Neurological: Negative.   Psychiatric/Behavioral: Negative.          Objective:    BP 120/76   Pulse 65   Temp 98.1 F (36.7 C)   Ht 6' 1 (1.854 m)   Wt 240 lb 3.2 oz (109 kg)   SpO2 97%   BMI 31.69 kg/m    Physical Exam Constitutional:      Appearance: Normal appearance.  HENT:     Head: Normocephalic and atraumatic.  Cardiovascular:     Rate and Rhythm: Normal rate and regular rhythm.     Heart sounds: Normal heart  sounds.  Pulmonary:     Breath sounds: Normal breath sounds. No wheezing, rhonchi or rales.  Abdominal:     General: Bowel sounds are normal. There is no distension.     Palpations: Abdomen is soft.     Tenderness: There is no abdominal tenderness.  Musculoskeletal:        General: Swelling present. No tenderness.     Comments: 1+ bilateral lower extremity pitting edema extending midway up his shin bilaterally, slightly worse on the left than the right.  Nontender to palpation  Neurological:     Mental Status: He is alert and oriented to person, place, and time.  Psychiatric:        Mood and Affect: Mood normal.        Behavior: Behavior normal.     No results found for any visits on 08/29/23.      Assessment & Plan:   Problem List Items Addressed This Visit       Cardiovascular and Mediastinum   Hypertension - Primary   - This problem is chronic and stable -Patient blood pressure today is well-controlled at 120/76 -Will continue with current medication including amlodipine  5 mg daily, hydrochlorothiazide  25 mg daily and telmisartan  80 mg daily -Patient did have significant bilateral lower extremity swelling over the last 2  weeks.  He states this occurred when he ran out of his hydrochlorothiazide  and has been slowly improving since restarting this medication. -No further workup at this time      Relevant Orders   Comprehensive metabolic panel with GFR     Other   Bilateral lower extremity edema   - Patient states that he developed significant bilateral lower extremity swelling approximately 2 weeks ago after being out of his hydrochlorothiazide  for about a week -He states that the swelling is significantly improved after restarting his hydrochlorothiazide  but still has some swelling -On exam, he has 1+ bilateral lower extremity pitting edema slightly worse on the left than the right.  No erythema or tenderness noted -I suspect he likely has chronic venous insufficiency  causing his lower extremity edema which was exacerbated by being off his hydrochlorothiazide . -However, given that he does have a history of travel prior to the onset of swelling including a 3-hour car ride each way we will obtain bilateral lower extremity Dopplers to rule out a DVT -Patient is also on amlodipine  which can cause lower extremity swelling but he has been on this medication for a year without this side effect.  Will continue with this medication for now -Patient has no shortness of breath or cough making heart failure less likely.  However, we will check a BNP today for further evaluation -Will also check a CMP to assess renal and liver function to ensure that this is not a cause of his lower extremity swelling -No further workup at this time      Relevant Orders   Comprehensive metabolic panel with GFR   B Nat Peptide   VAS US  LOWER EXTREMITY VENOUS (DVT)    No orders of the defined types were placed in this encounter.   No follow-ups on file.  Danah Reinecke, MD

## 2023-08-29 NOTE — Addendum Note (Signed)
 Addended by: SEBASTIAN NORRIS A on: 08/29/2023 03:41 PM   Modules accepted: Orders

## 2023-08-29 NOTE — Patient Instructions (Signed)
-   It was a pleasure meeting you today -Continue with hydrochlorothiazide  as this appears to be helping with the lower extremity swelling -We will put in for an urgent ultrasound of both your legs to make sure that there is no underlying blood clot causing the swelling -We will also check some blood work on you today including your kidney function, liver function as well as your BNP (to see if the swelling is from your heart) -We will contact you with these results -Please call us  with any questions or concerns or if you need any refills

## 2023-08-30 ENCOUNTER — Ambulatory Visit: Payer: Self-pay | Admitting: Internal Medicine

## 2023-08-30 LAB — COMPREHENSIVE METABOLIC PANEL WITH GFR
ALT: 19 U/L (ref 0–53)
AST: 23 U/L (ref 0–37)
Albumin: 4.3 g/dL (ref 3.5–5.2)
Alkaline Phosphatase: 77 U/L (ref 39–117)
BUN: 19 mg/dL (ref 6–23)
CO2: 28 meq/L (ref 19–32)
Calcium: 9.2 mg/dL (ref 8.4–10.5)
Chloride: 104 meq/L (ref 96–112)
Creatinine, Ser: 1.05 mg/dL (ref 0.40–1.50)
GFR: 73.78 mL/min (ref >60.00)
Glucose, Bld: 98 mg/dL (ref 70–99)
Potassium: 3.7 meq/L (ref 3.5–5.1)
Sodium: 140 meq/L (ref 135–145)
Total Bilirubin: 0.6 mg/dL (ref 0.2–1.2)
Total Protein: 6.7 g/dL (ref 6.0–8.3)

## 2023-08-30 LAB — BRAIN NATRIURETIC PEPTIDE: Pro B Natriuretic peptide (BNP): 7 pg/mL (ref 0.0–100.0)

## 2023-08-30 NOTE — Addendum Note (Signed)
 Addended by: MARYLEN PRO A on: 08/30/2023 09:14 AM   Modules accepted: Orders

## 2023-09-03 ENCOUNTER — Other Ambulatory Visit: Payer: Self-pay | Admitting: Internal Medicine

## 2023-09-18 MED ORDER — ZOLPIDEM TARTRATE 10 MG PO TABS: 10.0000 mg | ORAL_TABLET | Freq: Every evening | ORAL | 0 refills | Status: DC | PRN

## 2023-09-18 NOTE — Telephone Encounter (Signed)
 Requesting: ambien  Contract: No UDS: no Last Visit: 04/17/2023 Next Visit: none Last Refill: E-Prescribing Status: Receipt confirmed by pharmacy (05/10/2023  1:32 PM EDT)

## 2023-09-22 ENCOUNTER — Ambulatory Visit
Admission: EM | Admit: 2023-09-22 | Discharge: 2023-09-22 | Disposition: A | Discharge status: Home / Self Care | Attending: Emergency Medicine | Admitting: Emergency Medicine

## 2023-09-22 DIAGNOSIS — J0100 Acute maxillary sinusitis, unspecified: Secondary | ICD-10-CM | POA: Diagnosis not present

## 2023-09-22 MED ORDER — AMOXICILLIN-POT CLAVULANATE 875-125 MG PO TABS: 1.0000 | ORAL_TABLET | Freq: Two times a day (BID) | ORAL | 0 refills | Status: DC

## 2023-09-22 NOTE — ED Triage Notes (Signed)
 Patient to Urgent Care with complaints of sore throat/ green colored nasal congestion/ cough. Sinus pain. Concerned about a sinus infection.   Symptoms x4 days. Negative covid test at home.   Meds: theraflu at night.

## 2023-09-22 NOTE — ED Provider Notes (Signed)
 [facility]    CSN: [id number] Arrival date & time: 09/22/23  1012      History   Chief Complaint Chief Complaint  Patient presents with   Nasal Congestion   Cough   Sore Throat    HPI Walter Osborne is a 68 y.o. male.  Patient presents with 4-day history of congestion, sinus pressure, sore throat, cough.  Treating with TheraFlu.  No fever or shortness of breath.  Negative COVID test at home.  The history is provided by the patient and medical records.    Past Medical History:  Diagnosis Date   Hyperlipidemia    Hypertension    Sleep apnea     Patient Active Problem List   Diagnosis Date Noted   Bilateral lower extremity edema 08/29/2023   History of colonic polyps 12/26/2022   Gout 12/13/2022   Rectal leakage 12/13/2022   Diuretic-induced hypokalemia 12/13/2022   Rectal bleeding 11/20/2022   Diverticulitis 11/20/2022   Elevated PSA, less than 10 ng/ml 11/17/2020   Olecranon bursitis 12/01/2019   Hepatic steatosis 11/15/2019   Left anterior knee pain 11/13/2019   Educated about COVID-19 virus infection 07/25/2018   Adenomatous polyp of colon 06/11/2017   ED (erectile dysfunction) 09/19/2016   Welcome to Medicare preventive visit 01/24/2014   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   OSA on CPAP 12/16/2012   Hypertension 06/13/2011   Obesity (BMI 30-39.9) 06/13/2011   Hyperlipidemia     Past Surgical History:  Procedure Laterality Date   COLONOSCOPY     COLONOSCOPY WITH PROPOFOL  N/A 12/25/2019   Procedure: COLONOSCOPY WITH PROPOFOL ;  Surgeon: Therisa Bi, MD;  Location: [facility] ENDOSCOPY;  Service: Gastroenterology;  Laterality: N/A;   COLONOSCOPY WITH PROPOFOL  N/A 12/26/2022   Procedure: COLONOSCOPY WITH PROPOFOL ;  Surgeon: Therisa Bi, MD;  Location: [facility] ENDOSCOPY;  Service: Gastroenterology;  Laterality: N/A;   POLYPECTOMY  12/26/2022   Procedure: POLYPECTOMY;  Surgeon: Therisa Bi, MD;  Location: [facility] ENDOSCOPY;  Service: Gastroenterology;;        Home Medications    Prior to Admission medications   Medication Sig Start Date End Date Taking? Authorizing Provider  amoxicillin -clavulanate (AUGMENTIN ) 875-125 MG tablet Take 1 tablet by mouth every 12 (twelve) hours. 09/22/23  Yes Corlis Burnard DEL, NP  amLODipine  (NORVASC ) 5 MG tablet Take 1 tablet by mouth once daily 09/03/23   Tullo, Teresa L, MD  aspirin  EC 81 MG tablet Take 1 tablet (81 mg total) by mouth daily. Swallow whole. 01/05/20   Darliss Rogue, MD  atorvastatin  (LIPITOR) 80 MG tablet Take 1 tablet by mouth once daily 07/23/23   Tullo, Teresa L, MD  colchicine  0.6 MG tablet TAKE 2 TABLETS BY MOUTH IMMEDIATELY THEN TAKE 1 TABLET TWICE DAILY FOR  THE  DURATION  OF  THE  FLARE  UP  TO  A  MAXIMUM  OF  7 12/13/22   Tullo, Teresa L, MD  fluticasone  (FLONASE ) 50 MCG/ACT nasal spray USE 2 SPRAYS EACH NOSTRIL DAILY 07/27/14   Tullo, Teresa L, MD  hydrochlorothiazide  (HYDRODIURIL ) 25 MG tablet Take 1 tablet by mouth once daily 08/06/23   Tullo, Teresa L, MD  KLOR-CON  M20 20 MEQ tablet Take 1 tablet by mouth once daily 08/28/23   Tullo, Teresa L, MD  Loratadine (ALAVERT PO) Take by mouth. 1 dissolvable tablet as needed daily for allergies.     [provider]  PEG 3350 -KCl-NaBcb-NaCl-NaSulf (PEG-3350/ELECTROLYTES) 236 g SOLR Take 4,000 mLs by mouth once. 10/30/22   [provider]  telmisartan  (MICARDIS ) 80 MG tablet Take 1 tablet by mouth once daily 07/17/23   Tullo, Teresa L, MD  zolpidem  (AMBIEN ) 10 MG tablet Take 1 tablet (10 mg total) by mouth at bedtime as needed. for sleep 09/18/23   Marylynn Verneita CROME, MD    Family History Family History  Problem Relation Age of Onset   Hyperlipidemia Mother    Diabetes Father    CAD Father        MI at age 65   Cancer Maternal Aunt        Lung CA   Heart attack Maternal Grandfather 1    Social History Social History   Tobacco Use   Smoking status: Former    Current packs/day: 0.00    Types: Cigarettes    Start date:  02/14/1983    Quit date: 02/13/1993    Years since quitting: 30.6   Smokeless tobacco: Never  Vaping Use   Vaping status: Never Used  Substance Use Topics   Alcohol use: Yes    Alcohol/week: 4.0 standard drinks of alcohol    Types: 4 Glasses of wine per week   Drug use: No     Allergies   Patient has no known allergies.   Review of Systems Review of Systems  Constitutional:  Negative for chills and fever.  HENT:  Positive for congestion, postnasal drip, rhinorrhea, sinus pressure and sore throat. Negative for ear pain.   Respiratory:  Positive for cough. Negative for shortness of breath.      Physical Exam Triage Vital Signs ED Triage Vitals [09/22/23 1022]  Encounter Vitals Group     BP 138/76     Girls Systolic BP Percentile      Girls Diastolic BP Percentile      Boys Systolic BP Percentile      Boys Diastolic BP Percentile      Pulse Rate 72     Resp 18     Temp 99 F (37.2 C)     Temp src      SpO2 98 %     Weight      Height      Head Circumference      Peak Flow      Pain Score      Pain Loc      Pain Education      Exclude from Growth Chart    No data found.  Updated Vital Signs BP 138/76   Pulse 72   Temp 99 F (37.2 C)   Resp 18   SpO2 98%   Visual Acuity Right Eye Distance:   Left Eye Distance:   Bilateral Distance:    Right Eye Near:   Left Eye Near:    Bilateral Near:     Physical Exam Constitutional:      General: He is not in acute distress. HENT:     Right Ear: Tympanic membrane normal.     Left Ear: Tympanic membrane normal.     Nose: Congestion and rhinorrhea present.     Mouth/Throat:     Mouth: Mucous membranes are moist.     Pharynx: Oropharynx is clear.  Cardiovascular:     Rate and Rhythm: Normal rate and regular rhythm.     Heart sounds: Normal heart sounds.  Pulmonary:     Effort: Pulmonary effort is normal. No respiratory distress.     Breath sounds: Normal breath sounds.  Neurological:     Mental Status: He  is alert.  UC Treatments / Results  Labs (all labs ordered are listed, but only abnormal results are displayed) Labs Reviewed - No data to display  EKG   Radiology No results found.  Procedures Procedures (including critical care time)  Medications Ordered in UC Medications - No data to display  Initial Impression / Assessment and Plan / UC Course  I have reviewed the triage vital signs and the nursing notes.  Pertinent labs & imaging results that were available during my care of the patient were reviewed by me and considered in my medical decision making (see chart for details).    Acute sinusitis.  Afebrile and vital signs are stable.  Encourage patient to continue symptomatic care only for another couple of days.  Discussed starting Augmentin  if he is not improving or gets worse.  Education provided on sinus infection.  Instructed him to follow-up with his PCP if he is not improving.  He agrees to plan of care.  Final Clinical Impressions(s) / UC Diagnoses   Final diagnoses:  Acute non-recurrent maxillary sinusitis     Discharge Instructions      If your symptoms are not improving in 2 to 3 days, start the Augmentin  as directed.  Follow-up with your primary care provider if your symptoms are not improving.      ED Prescriptions     Medication Sig Dispense Auth. Provider   amoxicillin -clavulanate (AUGMENTIN ) 875-125 MG tablet Take 1 tablet by mouth every 12 (twelve) hours. 14 tablet Corlis Burnard DEL, NP      PDMP not reviewed this encounter.   Corlis Burnard DEL, NP 09/22/23 1052

## 2023-09-22 NOTE — Discharge Instructions (Addendum)
 If your symptoms are not improving in 2 to 3 days, start the Augmentin  as directed.  Follow-up with your primary care provider if your symptoms are not improving.

## 2023-10-11 ENCOUNTER — Other Ambulatory Visit: Payer: Self-pay | Admitting: Internal Medicine

## 2023-10-11 DIAGNOSIS — E876 Hypokalemia: Secondary | ICD-10-CM

## 2023-10-11 MED ORDER — ATORVASTATIN CALCIUM 80 MG PO TABS: 80.0000 mg | ORAL_TABLET | Freq: Every day | ORAL | 0 refills | Status: DC

## 2023-10-11 MED ORDER — HYDROCHLOROTHIAZIDE 25 MG PO TABS: 25.0000 mg | ORAL_TABLET | Freq: Every day | ORAL | 0 refills | Status: DC

## 2023-10-11 MED ORDER — POTASSIUM CHLORIDE CRYS ER 20 MEQ PO TBCR: 20.0000 meq | EXTENDED_RELEASE_TABLET | Freq: Every day | ORAL | 0 refills | Status: DC

## 2023-10-11 MED ORDER — COLCHICINE 0.6 MG PO TABS: ORAL_TABLET | ORAL | 5 refills | Status: DC

## 2023-10-11 NOTE — Assessment & Plan Note (Signed)
 CONTINUE  A ONCE DAILY POTASSIUM SUPPLEMENT     Lab Results  Component Value Date   NA 140 08/29/2023   K 3.7 08/29/2023   CL 104 08/29/2023   CO2 28 08/29/2023

## 2023-10-15 ENCOUNTER — Other Ambulatory Visit: Payer: Self-pay | Admitting: Internal Medicine

## 2023-10-16 ENCOUNTER — Other Ambulatory Visit: Payer: Self-pay

## 2023-10-16 MED ORDER — AMLODIPINE BESYLATE 5 MG PO TABS: 5.0000 mg | ORAL_TABLET | Freq: Every day | ORAL | 0 refills | Status: DC

## 2023-10-16 MED ORDER — COLCHICINE 0.6 MG PO TABS: ORAL_TABLET | ORAL | 0 refills | Status: DC

## 2023-10-16 MED ORDER — POTASSIUM CHLORIDE CRYS ER 20 MEQ PO TBCR: 20.0000 meq | EXTENDED_RELEASE_TABLET | Freq: Every day | ORAL | 0 refills | Status: AC

## 2023-10-16 MED ORDER — HYDROCHLOROTHIAZIDE 25 MG PO TABS: 25.0000 mg | ORAL_TABLET | Freq: Every day | ORAL | 0 refills | Status: DC

## 2023-10-16 MED ORDER — TELMISARTAN 80 MG PO TABS: 80.0000 mg | ORAL_TABLET | Freq: Every day | ORAL | 0 refills | Status: DC

## 2023-10-16 MED ORDER — ZOLPIDEM TARTRATE 10 MG PO TABS: 10.0000 mg | ORAL_TABLET | Freq: Every evening | ORAL | 0 refills | Status: DC | PRN

## 2023-10-16 MED ORDER — ATORVASTATIN CALCIUM 80 MG PO TABS: 80.0000 mg | ORAL_TABLET | Freq: Every day | ORAL | 0 refills | Status: DC

## 2023-10-28 ENCOUNTER — Other Ambulatory Visit: Payer: Self-pay | Admitting: Internal Medicine

## 2023-12-04 ENCOUNTER — Ambulatory Visit (INDEPENDENT_AMBULATORY_CARE_PROVIDER_SITE_OTHER): Payer: Medicare Other

## 2023-12-04 ENCOUNTER — Telehealth: Payer: Self-pay

## 2023-12-04 VITALS — Ht 73.0 in | Wt 230.0 lb

## 2023-12-04 DIAGNOSIS — Z0000 Encounter for general adult medical examination without abnormal findings: Secondary | ICD-10-CM | POA: Diagnosis not present

## 2023-12-04 MED ORDER — AMLODIPINE BESYLATE 5 MG PO TABS: 5.0000 mg | ORAL_TABLET | Freq: Every day | ORAL | 0 refills | Status: DC

## 2023-12-04 MED ORDER — HYDROCHLOROTHIAZIDE 25 MG PO TABS: 25.0000 mg | ORAL_TABLET | Freq: Every day | ORAL | 0 refills | Status: DC

## 2023-12-04 MED ORDER — TELMISARTAN 80 MG PO TABS: 80.0000 mg | ORAL_TABLET | Freq: Every day | ORAL | 0 refills | Status: DC

## 2023-12-04 MED ORDER — ATORVASTATIN CALCIUM 80 MG PO TABS: 80.0000 mg | ORAL_TABLET | Freq: Every day | ORAL | 0 refills | Status: DC

## 2023-12-04 MED ORDER — COLCHICINE 0.6 MG PO TABS: ORAL_TABLET | ORAL | 0 refills | Status: DC

## 2023-12-04 MED ORDER — ZOLPIDEM TARTRATE 10 MG PO TABS: 10.0000 mg | ORAL_TABLET | Freq: Every evening | ORAL | Status: AC | PRN

## 2023-12-04 NOTE — Patient Instructions (Signed)
 Mr. Walter Osborne,  Thank you for taking the time for your Medicare Wellness Visit. I appreciate your continued commitment to your health goals. Please review the care plan we discussed, and feel free to reach out if I can assist you further.  Medicare recommends these wellness visits once per year to help you and your care team stay ahead of potential health issues. These visits are designed to focus on prevention, allowing your provider to concentrate on managing your acute and chronic conditions during your regular appointments.  Please note that Annual Wellness Visits do not include a physical exam. Some assessments may be limited, especially if the visit was conducted virtually. If needed, we may recommend a separate in-person follow-up with your provider.  Ongoing Care Seeing your primary care provider every 3 to 6 months helps us  monitor your health and provide consistent, personalized care. Patient aware of appointment January 2026.  Referrals If a referral was made during today's visit and you haven't received any updates within two weeks, please contact the referred provider directly to check on the status.  Recommended Screenings:  Health Maintenance  Topic Date Due   Zoster (Shingles) Vaccine (1 of 2) Never done   Flu Shot  09/14/2023   COVID-19 Vaccine (5 - 2025-26 season) 10/15/2023   DTaP/Tdap/Td vaccine (2 - Td or Tdap) 01/23/2024   Medicare Annual Wellness Visit  12/03/2024   Colon Cancer Screening  12/25/2029   Pneumococcal Vaccine for age over 76  Completed   Hepatitis C Screening  Completed   Meningitis B Vaccine  Aged Out       12/04/2023    8:59 AM  Advanced Directives  Does Patient Have a Medical Advance Directive? No  Would patient like information on creating a medical advance directive? No - Patient declined   Advance Care Planning is important because it: Ensures you receive medical care that aligns with your values, goals, and preferences. Provides  guidance to your family and loved ones, reducing the emotional burden of decision-making during critical moments.  Vision: Annual vision screenings are recommended for early detection of glaucoma, cataracts, and diabetic retinopathy. These exams can also reveal signs of chronic conditions such as diabetes and high blood pressure.  Dental: Annual dental screenings help detect early signs of oral cancer, gum disease, and other conditions linked to overall health, including heart disease and diabetes.  Please see the attached documents for additional preventive care recommendations.

## 2023-12-04 NOTE — Progress Notes (Signed)
 Subjective:   Walter Osborne is a 68 y.o. male who presents for Medicare Annual/Subsequent preventive examination.  Visit Complete: Virtual I connected with  Ozell Cheryl Istre on 12/04/23 by a audio enabled telemedicine application and verified that I am speaking with the correct person using two identifiers.  Patient Location: Home  Provider Location: Home Office  I discussed the limitations of evaluation and management by telemedicine. The patient expressed understanding and agreed to proceed.  Vital Signs: Because this visit was a virtual/telehealth visit, some criteria may be missing or patient reported. Any vitals not documented were not able to be obtained and vitals that have been documented are patient reported.  Patient Medicare AWV questionnaire was completed by the patient on 11/30/2023; I have confirmed that all information answered by patient is correct and no changes since this date.  Cardiac Risk Factors include: advanced age (>68men, >75 women);male gender;obesity (BMI >30kg/m2);dyslipidemia     Objective:    Today's Vitals   12/04/23 0853  Weight: 230 lb (104.3 kg)  Height: 6' 1 (1.854 m)   Body mass index is 30.34 kg/m.     12/04/2023    8:59 AM 09/22/2023   10:23 AM 12/26/2022    8:19 AM 11/28/2022    8:32 AM 11/20/2022    1:32 PM 12/25/2019    8:48 AM  Advanced Directives  Does Patient Have a Medical Advance Directive? No No No No No No  Would patient like information on creating a medical advance directive? No - Patient declined  No - Patient declined No - Patient declined  No - Patient declined    Current Medications (verified) Outpatient Encounter Medications as of 12/04/2023  Medication Sig   aspirin  EC 81 MG tablet Take 1 tablet (81 mg total) by mouth daily. Swallow whole.   fluticasone  (FLONASE ) 50 MCG/ACT nasal spray USE 2 SPRAYS EACH NOSTRIL DAILY   Loratadine (ALAVERT PO) Take by mouth. 1 dissolvable tablet as needed daily for  allergies.    potassium chloride  SA (KLOR-CON  M20) 20 MEQ tablet Take 1 tablet (20 mEq total) by mouth daily.   [DISCONTINUED] colchicine  0.6 MG tablet TAKE 2 TABLETS BY MOUTH IMMEDIATELY THEN TAKE 1 TABLET TWICE DAILY FOR  THE  DURATION  OF  THE  FLARE  UP  TO  A  MAXIMUM  OF  7 (Patient taking differently: Take 0.6 mg by mouth. TAKE 2 TABLETS BY MOUTH IMMEDIATELY THEN TAKE 1 TABLET TWICE DAILY FOR  THE  DURATION  OF  THE  FLARE  UP  TO  A  MAXIMUM  OF  7)   amLODipine  (NORVASC ) 5 MG tablet Take 1 tablet (5 mg total) by mouth daily.   amoxicillin -clavulanate (AUGMENTIN ) 875-125 MG tablet Take 1 tablet by mouth every 12 (twelve) hours. (Patient not taking: Reported on 12/04/2023)   atorvastatin  (LIPITOR) 80 MG tablet Take 1 tablet (80 mg total) by mouth daily.   colchicine  0.6 MG tablet TAKE 2 TABLETS BY MOUTH IMMEDIATELY THEN TAKE 1 TABLET TWICE DAILY FOR  THE  DURATION  OF  THE  FLARE  UP  TO  A  MAXIMUM  OF  7   hydrochlorothiazide  (HYDRODIURIL ) 25 MG tablet Take 1 tablet (25 mg total) by mouth daily.   PEG 3350 -KCl-NaBcb-NaCl-NaSulf (PEG-3350/ELECTROLYTES) 236 g SOLR Take 4,000 mLs by mouth once.   telmisartan  (MICARDIS ) 80 MG tablet Take 1 tablet (80 mg total) by mouth daily.   zolpidem  (AMBIEN ) 10 MG tablet Take 1 tablet (10 mg total)  by mouth at bedtime as needed. for sleep   [DISCONTINUED] amLODipine  (NORVASC ) 5 MG tablet Take 1 tablet (5 mg total) by mouth daily.   [DISCONTINUED] atorvastatin  (LIPITOR) 80 MG tablet Take 1 tablet (80 mg total) by mouth daily.   [DISCONTINUED] hydrochlorothiazide  (HYDRODIURIL ) 25 MG tablet Take 1 tablet (25 mg total) by mouth daily.   [DISCONTINUED] telmisartan  (MICARDIS ) 80 MG tablet Take 1 tablet by mouth once daily   No facility-administered encounter medications on file as of 12/04/2023.    Allergies (verified) Patient has no known allergies.   History: Past Medical History:  Diagnosis Date   Hyperlipidemia    Hypertension    Sleep apnea     Past Surgical History:  Procedure Laterality Date   COLONOSCOPY     COLONOSCOPY WITH PROPOFOL  N/A 12/25/2019   Procedure: COLONOSCOPY WITH PROPOFOL ;  Surgeon: Therisa Bi, MD;  Location: [facility] ENDOSCOPY;  Service: Gastroenterology;  Laterality: N/A;   COLONOSCOPY WITH PROPOFOL  N/A 12/26/2022   Procedure: COLONOSCOPY WITH PROPOFOL ;  Surgeon: Therisa Bi, MD;  Location: [facility] ENDOSCOPY;  Service: Gastroenterology;  Laterality: N/A;   POLYPECTOMY  12/26/2022   Procedure: POLYPECTOMY;  Surgeon: Therisa Bi, MD;  Location: [facility] ENDOSCOPY;  Service: Gastroenterology;;   Family History  Problem Relation Age of Onset   Hyperlipidemia Mother    Diabetes Father    CAD Father        MI at age 41   Cancer Maternal Aunt        Lung CA   Heart attack Maternal Grandfather 65   Social History   Socioeconomic History   Marital status: Married    Spouse name: Not on file   Number of children: Not on file   Years of education: Not on file   Highest education level: Associate degree: occupational, Scientist, product/process development, or vocational program  Occupational History    Employer: [employer]    Comment: travels,    Tobacco Use   Smoking status: Former    Current packs/day: 0.00    Types: Cigarettes    Start date: 02/14/1983    Quit date: 02/13/1993    Years since quitting: 30.8   Smokeless tobacco: Never  Vaping Use   Vaping status: Never Used  Substance and Sexual Activity   Alcohol use: Yes    Alcohol/week: 4.0 standard drinks of alcohol    Types: 4 Glasses of wine per week   Drug use: No   Sexual activity: Not on file  Other Topics Concern   Not on file  Social History Narrative   Married   Social Drivers of Health   Financial Resource Strain: Low Risk  (11/30/2023)   Overall Financial Resource Strain (CARDIA)    Difficulty of Paying Living Expenses: Not hard at all  Food Insecurity: No Food Insecurity (11/30/2023)   Hunger Vital Sign    Worried About Running Out of Food in the Last  Year: Never true    Ran Out of Food in the Last Year: Never true  Transportation Needs: No Transportation Needs (11/30/2023)   PRAPARE - Administrator, Civil Service (Medical): No    Lack of Transportation (Non-Medical): No  Physical Activity: Sufficiently Active (11/30/2023)   Exercise Vital Sign    Days of Exercise per Week: 3 days    Minutes of Exercise per Session: 60 min  Stress: No Stress Concern Present (11/30/2023)   [employer] of Occupational Health - Occupational Stress Questionnaire    Feeling of Stress: Only a  little  Social Connections: Socially Integrated (11/30/2023)   Social Connection and Isolation Panel    Frequency of Communication with Friends and Family: More than three times a week    Frequency of Social Gatherings with Friends and Family: Twice a week    Attends Religious Services: 1 to 4 times per year    Active Member of [employer] or Organizations: Yes    Attends Engineer, structural: More than 4 times per year    Marital Status: Married    Tobacco Counseling Counseling given: Not Answered   Clinical Intake:  Pre-visit preparation completed: Yes  Pain : No/denies pain     BMI - recorded: 31.7 Nutritional Status: BMI > 30  Obese Nutritional Risks: None Diabetes: No  How often do you need to have someone help you when you read instructions, pamphlets, or other written materials from your doctor or pharmacy?: 1 - Never What is the last grade level you completed in school?: 2 years college  Interpreter Needed?: No  Information entered by :: Arnette Hoots, CMA   Activities of Daily Living    12/04/2023    8:55 AM 11/30/2023    9:01 AM  In your present state of health, do you have any difficulty performing the following activities:  Hearing? 0 0  Vision? 0 0  Difficulty concentrating or making decisions? 0 0  Walking or climbing stairs? 0 0  Dressing or bathing? 0 0  Doing errands, shopping? 0 0  Preparing Food  and eating ? N N  Using the Toilet? N N  In the past six months, have you accidently leaked urine? N N  Do you have problems with loss of bowel control? N N  Managing your Medications? N N  Managing your Finances? N N  Housekeeping or managing your Housekeeping? N N    Patient Care Team: Marylynn Verneita CROME, MD as PCP - General (Internal Medicine) Darliss Rogue, MD as PCP - Cardiology (Cardiology) Dellie Louanne MATSU, MD (General Surgery) Marylynn Verneita CROME, MD (Internal Medicine) Therisa Bi, MD as Consulting Physician (Gastroenterology)  Indicate any recent Medical Services you may have received from other than [facility] providers in the past year (date may be approximate).     Assessment:   This is a routine wellness examination for Dillin.  Hearing/Vision screen Hearing Screening - Comments:: No issues Vision Screening - Comments:: No issues, has appt with eye doctor   Goals Addressed             This Visit's Progress    Patient Stated       Working out at [employer] twice a week.        Depression Screen    12/04/2023    9:00 AM 08/29/2023    3:02 PM 04/17/2023    1:28 PM 12/13/2022    9:05 AM 11/28/2022    8:27 AM 11/20/2022   12:06 PM 06/12/2022    4:14 PM  PHQ 2/9 Scores  PHQ - 2 Score 0 0 0 0 0 0 0  PHQ- 9 Score  0  0 1 0     Fall Risk    12/04/2023    9:00 AM 11/30/2023    9:01 AM 08/29/2023    3:02 PM 04/17/2023    1:27 PM 12/13/2022    9:05 AM  Fall Risk   Falls in the past year? 0 0 0 0 0  Number falls in past yr: 0  0 0 0  Injury with  Fall? 0  0 0 0  Risk for fall due to : No Fall Risks  No Fall Risks No Fall Risks No Fall Risks  Follow up Falls evaluation completed;Education provided  Falls evaluation completed Falls evaluation completed Falls evaluation completed    MEDICARE RISK AT HOME: Medicare Risk at Home Any stairs in or around the home?: Yes If so, are there any without handrails?: No Home free of loose throw rugs in walkways,  pet beds, electrical cords, etc?: Yes Adequate lighting in your home to reduce risk of falls?: Yes Life alert?: No Use of a cane, walker or w/c?: No Grab bars in the bathroom?: No Shower chair or bench in shower?: No Elevated toilet seat or a handicapped toilet?: No  TIMED UP AND GO:  Was the test performed?  No    Cognitive Function:    11/16/2021    9:10 AM  MMSE - Mini Mental State Exam  Orientation to time 5  Orientation to Place 5  Registration 3  Attention/ Calculation 5  Recall 3  Language- name 2 objects 2  Language- repeat 1  Language- follow 3 step command 3  Language- read & follow direction 1  Write a sentence 1  Copy design 1  Total score 30        12/04/2023    9:01 AM 11/28/2022    8:33 AM 11/16/2021    9:01 AM  6CIT Screen  What Year? 0 points 0 points 0 points  What month? 0 points 0 points 0 points  What time? 0 points 0 points 0 points  Count back from 20 0 points 0 points 0 points  Months in reverse 0 points 0 points 0 points  Repeat phrase 0 points 0 points 0 points  Total Score 0 points 0 points 0 points    Immunizations Immunization History  Administered Date(s) Administered   Fluad Quad(high Dose 65+) 01/14/2022   INFLUENZA, HIGH DOSE SEASONAL PF 02/26/2023   Influenza,inj,Quad PF,6+ Mos 12/16/2012, 01/22/2014, 03/20/2016, 12/12/2017, 01/27/2021   Moderna SARS-COV2 Booster Vaccination 11/06/2019   Moderna Sars-Covid-2 Vaccination 01/18/2020   PFIZER(Purple Top)SARS-COV-2 Vaccination 05/09/2019, 06/03/2019   PNEUMOCOCCAL CONJUGATE-20 11/16/2021   Tdap 01/22/2014    TDAP status: Up to date  Flu Vaccine status: Due, Education has been provided regarding the importance of this vaccine. Advised may receive this vaccine at local pharmacy or Health Dept. Aware to provide a copy of the vaccination record if obtained from local pharmacy or Health Dept. Verbalized acceptance and understanding.  Pneumococcal vaccine status: Up to  date  Covid-19 vaccine status: Completed vaccines  Qualifies for Shingles Vaccine? Yes   Zostavax completed No   Shingrix Completed?: No.    Education has been provided regarding the importance of this vaccine. Patient has been advised to call insurance company to determine out of pocket expense if they have not yet received this vaccine. Advised may also receive vaccine at local pharmacy or Health Dept. Verbalized acceptance and understanding.  Screening Tests Health Maintenance  Topic Date Due   Zoster Vaccines- Shingrix (1 of 2) Never done   Influenza Vaccine  09/14/2023   COVID-19 Vaccine (5 - 2025-26 season) 10/15/2023   Medicare Annual Wellness (AWV)  11/28/2023   DTaP/Tdap/Td (2 - Td or Tdap) 01/23/2024   Colonoscopy  12/25/2029   Pneumococcal Vaccine: 50+ Years  Completed   Hepatitis C Screening  Completed   Meningococcal B Vaccine  Aged Out    Health Maintenance  Health Maintenance Due  Topic Date Due   Zoster Vaccines- Shingrix (1 of 2) Never done   Influenza Vaccine  09/14/2023   COVID-19 Vaccine (5 - 2025-26 season) 10/15/2023   Medicare Annual Wellness (AWV)  11/28/2023    Colorectal cancer screening: Type of screening: Colonoscopy. Completed 12/26/2022. Repeat every 7 years  Lung Cancer Screening: (Low Dose CT Chest recommended if Age 70-80 years, 20 pack-year currently smoking OR have quit w/in 15years.) does not qualify.   Lung Cancer Screening Referral: n//a  Additional Screening:  Hepatitis C Screening: does qualify; Completed 12/15/2019  Vision Screening: Recommended annual ophthalmology exams for early detection of glaucoma and other disorders of the eye. Is the patient up to date with their annual eye exam?  Yes  Who is the provider or what is the name of the office in which the patient attends annual eye exams? Near [facility]whatever provider available at the time If pt is not established with a provider, would they like to be referred to a  provider to establish care? No .   Dental Screening: Recommended annual dental exams for proper oral hygiene   Community Resource Referral / Chronic Care Management: CRR required this visit?  No   CCM required this visit?  No     Plan:     I have personally reviewed and noted the following in the patient's chart:   Medical and social history Use of alcohol, tobacco or illicit drugs  Current medications and supplements including opioid prescriptions. Patient is not currently taking opioid prescriptions. Functional ability and status Nutritional status Physical activity Advanced directives List of other physicians Hospitalizations, surgeries, and ER visits in previous 12 months Vitals Screenings to include cognitive, depression, and falls Referrals and appointments  In addition, I have reviewed and discussed with patient certain preventive protocols, quality metrics, and best practice recommendations. A written personalized care plan for preventive services as well as general preventive health recommendations were provided to patient.     Arnette LOISE Hoots, CMA   12/04/2023   After Visit Summary: (Mail) Due to this being a telephonic visit, the after visit summary with patients personalized plan was offered to patient via mail   Nurse Notes: Patient was in need of refills. I have sent all refills needed except zolpidem  and that will be requested from the provider.

## 2023-12-05 NOTE — Telephone Encounter (Signed)
 Refills sent

## 2024-01-16 ENCOUNTER — Telehealth: Payer: Self-pay | Admitting: Internal Medicine

## 2024-01-16 DIAGNOSIS — R7301 Impaired fasting glucose: Secondary | ICD-10-CM

## 2024-01-16 DIAGNOSIS — I10 Essential (primary) hypertension: Secondary | ICD-10-CM

## 2024-01-16 DIAGNOSIS — E782 Mixed hyperlipidemia: Secondary | ICD-10-CM

## 2024-01-16 DIAGNOSIS — R5383 Other fatigue: Secondary | ICD-10-CM

## 2024-01-16 NOTE — Telephone Encounter (Signed)
 Copied from CRM #[id number]. Topic: Appointments - Scheduling Inquiry for Clinic >> Jan 16, 2024  2:21 PM Mesmerise C wrote: Reason for CRM: Patient inquiring if labs are being done before appt for physical on 12/10 can be reached at [phone]

## 2024-01-17 NOTE — Addendum Note (Signed)
 Addended by: Uri Covey on: 01/17/2024 04:56 PM   Modules accepted: Orders

## 2024-01-17 NOTE — Addendum Note (Signed)
 Addended by: MARYLYNN VERNEITA CROME on: 01/17/2024 05:17 PM   Modules accepted: Orders

## 2024-01-17 NOTE — Telephone Encounter (Signed)
 I have pended labs for your approval. If needed I will call pt and schedule lab appt.

## 2024-01-18 NOTE — Telephone Encounter (Signed)
 Pt needs a lab appt before his physical on 01/23/2024.

## 2024-01-18 NOTE — Telephone Encounter (Signed)
 noted

## 2024-01-21 ENCOUNTER — Other Ambulatory Visit

## 2024-01-21 DIAGNOSIS — R5383 Other fatigue: Secondary | ICD-10-CM

## 2024-01-21 DIAGNOSIS — I10 Essential (primary) hypertension: Secondary | ICD-10-CM

## 2024-01-21 DIAGNOSIS — E782 Mixed hyperlipidemia: Secondary | ICD-10-CM

## 2024-01-21 DIAGNOSIS — R7301 Impaired fasting glucose: Secondary | ICD-10-CM

## 2024-01-21 LAB — MICROALBUMIN / CREATININE URINE RATIO
Creatinine,U: 20.4 mg/dL
Microalb Creat Ratio: UNDETERMINED mg/g (ref 0.0–30.0)
Microalb, Ur: <0.7 mg/dL

## 2024-01-21 LAB — COMPREHENSIVE METABOLIC PANEL WITH GFR
ALT: 26 U/L (ref 0–53)
AST: 31 U/L (ref 0–37)
Albumin: 4.7 g/dL (ref 3.5–5.2)
Alkaline Phosphatase: 85 U/L (ref 39–117)
BUN: 16 mg/dL (ref 6–23)
CO2: 30 meq/L (ref 19–32)
Calcium: 9.7 mg/dL (ref 8.4–10.5)
Chloride: 102 meq/L (ref 96–112)
Creatinine, Ser: 0.99 mg/dL (ref 0.40–1.50)
GFR: 78.96 mL/min (ref >60.00)
Glucose, Bld: 95 mg/dL (ref 70–99)
Potassium: 3.7 meq/L (ref 3.5–5.1)
Sodium: 142 meq/L (ref 135–145)
Total Bilirubin: 1.1 mg/dL (ref 0.2–1.2)
Total Protein: 7.4 g/dL (ref 6.0–8.3)

## 2024-01-21 LAB — CBC WITH DIFFERENTIAL/PLATELET
Basophils Absolute: 0.1 K/uL (ref 0.0–0.1)
Basophils Relative: 1 % (ref 0.0–3.0)
Eosinophils Absolute: 0.1 K/uL (ref 0.0–0.7)
Eosinophils Relative: 2.2 % (ref 0.0–5.0)
HCT: 42.9 % (ref 39.0–52.0)
Hemoglobin: 14.9 g/dL (ref 13.0–17.0)
Lymphocytes Relative: 30.8 % (ref 12.0–46.0)
Lymphs Abs: 1.9 K/uL (ref 0.7–4.0)
MCHC: 34.8 g/dL (ref 30.0–36.0)
MCV: 92 fl (ref 78.0–100.0)
Monocytes Absolute: 0.6 K/uL (ref 0.1–1.0)
Monocytes Relative: 9.9 % (ref 3.0–12.0)
Neutro Abs: 3.5 K/uL (ref 1.4–7.7)
Neutrophils Relative %: 56.1 % (ref 43.0–77.0)
Platelets: 203 K/uL (ref 150.0–400.0)
RBC: 4.67 Mil/uL (ref 4.22–5.81)
RDW: 13.7 % (ref 11.5–15.5)
WBC: 6.3 K/uL (ref 4.0–10.5)

## 2024-01-21 LAB — TSH: TSH: 1.89 u[IU]/mL (ref 0.35–5.50)

## 2024-01-21 LAB — LIPID PANEL
Cholesterol: 172 mg/dL (ref 0–200)
HDL: 53.3 mg/dL (ref >39.00)
LDL Cholesterol: 92 mg/dL (ref 0–99)
NonHDL: 118.81
Total CHOL/HDL Ratio: 3
Triglycerides: 133 mg/dL (ref 0.0–149.0)
VLDL: 26.6 mg/dL (ref 0.0–40.0)

## 2024-01-21 LAB — HEMOGLOBIN A1C: Hgb A1c MFr Bld: 5.8 % (ref 4.6–6.5)

## 2024-01-21 LAB — LDL CHOLESTEROL, DIRECT: Direct LDL: 107 mg/dL

## 2024-01-23 ENCOUNTER — Encounter: Payer: Self-pay | Admitting: Internal Medicine

## 2024-01-23 ENCOUNTER — Ambulatory Visit (INDEPENDENT_AMBULATORY_CARE_PROVIDER_SITE_OTHER): Admitting: Internal Medicine

## 2024-01-23 VITALS — BP 118/66 | HR 82 | Ht 73.0 in | Wt 236.6 lb

## 2024-01-23 DIAGNOSIS — Z23 Encounter for immunization: Secondary | ICD-10-CM | POA: Diagnosis not present

## 2024-01-23 DIAGNOSIS — R972 Elevated prostate specific antigen [PSA]: Secondary | ICD-10-CM | POA: Diagnosis not present

## 2024-01-23 DIAGNOSIS — K760 Fatty (change of) liver, not elsewhere classified: Secondary | ICD-10-CM | POA: Diagnosis not present

## 2024-01-23 DIAGNOSIS — R202 Paresthesia of skin: Secondary | ICD-10-CM | POA: Diagnosis not present

## 2024-01-23 DIAGNOSIS — Z0001 Encounter for general adult medical examination with abnormal findings: Secondary | ICD-10-CM | POA: Diagnosis not present

## 2024-01-23 DIAGNOSIS — M1A9XX0 Chronic gout, unspecified, without tophus (tophi): Secondary | ICD-10-CM

## 2024-01-23 LAB — URIC ACID: Uric Acid, Serum: 7.7 mg/dL (ref 4.0–7.8)

## 2024-01-23 LAB — B12 AND FOLATE PANEL
Folate: 13.7 ng/mL (ref >5.9)
Vitamin B-12: 574 pg/mL (ref 211–911)

## 2024-01-23 MED ORDER — COLCHICINE 0.6 MG PO TABS: ORAL_TABLET | ORAL | 0 refills | Status: AC

## 2024-01-23 MED ORDER — TETANUS-DIPHTH-ACELL PERTUSSIS 5-2-15.5 LF-MCG/0.5 IM SUSP: 0.5000 mL | Freq: Once | INTRAMUSCULAR | 0 refills | Status: DC

## 2024-01-23 MED ORDER — HYDROCHLOROTHIAZIDE 25 MG PO TABS: 25.0000 mg | ORAL_TABLET | Freq: Every day | ORAL | 3 refills | Status: AC

## 2024-01-23 MED ORDER — TETANUS-DIPHTH-ACELL PERTUSSIS 5-2-15.5 LF-MCG/0.5 IM SUSP: 0.5000 mL | Freq: Once | INTRAMUSCULAR | 0 refills | Status: AC

## 2024-01-23 MED ORDER — AMLODIPINE BESYLATE 5 MG PO TABS: 5.0000 mg | ORAL_TABLET | Freq: Every day | ORAL | 3 refills | Status: AC

## 2024-01-23 MED ORDER — ATORVASTATIN CALCIUM 80 MG PO TABS: 80.0000 mg | ORAL_TABLET | Freq: Every day | ORAL | 3 refills | Status: AC

## 2024-01-23 MED ORDER — TELMISARTAN 80 MG PO TABS: 80.0000 mg | ORAL_TABLET | Freq: Every day | ORAL | 3 refills | Status: AC

## 2024-01-23 NOTE — Progress Notes (Signed)
 Patient ID: Walter Osborne, male    DOB: [birth year]  Age: 68 y.o. MRN: [id number]  The patient is here for annual preventive examination and management of other chronic and acute problems.   The risk factors are reflected in the social history.   The roster of all physicians providing medical care to patient - is listed in the Snapshot section of the chart.   Activities of daily living:  The patient is 100% independent in all ADLs: dressing, toileting, feeding as well as independent mobility   Home safety : The patient has smoke detectors in the home. They wear seatbelts.  There are no unsecured firearms at home. There is no violence in the home.    There is no risks for hepatitis, STDs or HIV. There is no   history of blood transfusion. They have no travel history to infectious disease endemic areas of the world.   The patient has seen their dentist in the last six month. They have seen their eye doctor in the last year. The patinet  denies slight hearing difficulty with regard to whispered voices and some television programs.  They have deferred audiologic testing in the last year.  They do not  have excessive sun exposure. Discussed the need for sun protection: hats, long sleeves and use of sunscreen if there is significant sun exposure.    Diet: the importance of a healthy diet is discussed. They do have a healthy diet.   The benefits of regular aerobic exercise were discussed. The patient  exercises  3 to 5 days per week  for  60 minutes.    Depression screen: there are no signs or vegative symptoms of depression- irritability, change in appetite, anhedonia, sadness/tearfullness.   The following portions of the patient's history were reviewed and updated as appropriate: allergies, current medications, past family history, past medical history,  past surgical history, past social history  and problem list.   Visual acuity was not assessed per patient preference since the patient has  regular follow up with an  ophthalmologist. Hearing and body mass index were assessed and reviewed.    During the course of the visit the patient was educated and counseled about appropriate screening and preventive services including : fall prevention , diabetes screening, nutrition counseling, colorectal cancer screening, and recommended immunizations.    Chief Complaint:  RECURRENT GOUT FLARES,  3 THIS YEAR  OE AFTER THANKSGIVING   AFFECTS THE FOREFOOT  OF THE LEFT   FOOT,  NOT THE GREAT TOE   ELEVATED PSA:   REPEAT PSA WAS NORMAL IN APRIL AND HE WAS SUPPOSED TO FOLL OW UP IN 6 MONTHS with urology. But has not done this. He denies nocturia, urinary flow issues.  Numbness and tingling of feet when he wakes up in the morning     Review of Symptoms  Patient denies headache, fevers, malaise, unintentional weight loss, skin rash, eye pain, sinus congestion and sinus pain, sore throat, dysphagia,  hemoptysis , cough, dyspnea, wheezing, chest pain, palpitations, orthopnea, edema, abdominal pain, nausea, melena, diarrhea, constipation, flank pain, dysuria, hematuria, urinary  Frequency,  seizures,  Focal weakness, Loss of consciousness,  Tremor, insomnia, depression, anxiety, and suicidal ideation.    Physical Exam:  BP 118/66   Pulse 82   Ht 6' 1 (1.854 m)   Wt 236 lb 9.6 oz (107.3 kg)   SpO2 96%   BMI 31.22 kg/m    Physical Exam Vitals reviewed.  Constitutional:  General: He is not in acute distress.    Appearance: Normal appearance. He is normal weight. He is not ill-appearing, toxic-appearing or diaphoretic.  HENT:     Head: Normocephalic and atraumatic.     Right Ear: Tympanic membrane, ear canal and external ear normal. There is no impacted cerumen.     Left Ear: Tympanic membrane, ear canal and external ear normal. There is no impacted cerumen.     Nose: Nose normal.     Mouth/Throat:     Mouth: Mucous membranes are moist.     Pharynx: Oropharynx is clear.  Eyes:      General: No scleral icterus.       Right eye: No discharge.        Left eye: No discharge.     Conjunctiva/sclera: Conjunctivae normal.  Neck:     Thyroid : No thyromegaly.     Vascular: No carotid bruit or JVD.  Cardiovascular:     Rate and Rhythm: Normal rate and regular rhythm.     Heart sounds: Normal heart sounds.  Pulmonary:     Effort: Pulmonary effort is normal. No respiratory distress.     Breath sounds: Normal breath sounds.  Abdominal:     General: Bowel sounds are normal.     Palpations: Abdomen is soft. There is no mass.     Tenderness: There is no abdominal tenderness. There is no guarding or rebound.  Musculoskeletal:        General: Normal range of motion.     Cervical back: Normal range of motion and neck supple.  Lymphadenopathy:     Cervical: No cervical adenopathy.  Skin:    General: Skin is warm and dry.  Neurological:     General: No focal deficit present.     Mental Status: He is alert and oriented to person, place, and time. Mental status is at baseline.  Psychiatric:        Mood and Affect: Mood normal.        Behavior: Behavior normal.        Thought Content: Thought content normal.        Judgment: Judgment normal.     Assessment and Plan: Chronic gout without tophus, unspecified cause, unspecified site Assessment & Plan: He has had 3 episodes in the last year and uric acid level is elevated.  Allopurinol offered  Lab Results  Component Value Date   LABURIC 7.7 01/23/2024     Orders: -     Uric acid  Tingling in extremities Assessment & Plan: Screening labs are normal  Lab Results  Component Value Date   VITAMINB12 574 01/23/2024   Lab Results  Component Value Date   HGBA1C 5.8 01/21/2024   Lab Results  Component Value Date   TSH 1.89 01/21/2024     Orders: -     B12 and Folate Panel  Need for influenza vaccination -     Flu vaccine HIGH DOSE PF(Fluzone Trivalent)  Elevated PSA, less than 10 ng/ml Assessment &  Plan: Advised to schedule follow up with urology as directed after April PSA had normalized.    Hepatic steatosis Assessment & Plan: Presumed by ultrasound changes and serologies negative for autoimmune causes of hepatitis in November 2021.  He was offered referral to liver specialist at time of workup but declined.  He has been following a low GI diet  Reducing alcohol use, , exercising and taking meds  but deferred the prescribed metformin . .LFTs are normal  but  FIB 4 score is > 2. Will order Fibroscan and refer to GI   Lab Results  Component Value Date   ALT 26 01/21/2024   AST 31 01/21/2024   ALKPHOS 85 01/21/2024   BILITOT 1.1 01/21/2024   Fibrosis 4 Score = 2.01  Fib-4 interpretation is not validated for people under 35 or over 28 years of age. However, scores under 2.0 are generally considered low risk.    Encounter for general adult medical examination with abnormal findings Assessment & Plan: age appropriate education and counseling updated, referrals for preventative services and immunizations addressed, dietary and smoking counseling addressed, most recent labs reviewed.  I have personally reviewed and have noted:   1) the patient's medical and social history 2) The pt's use of alcohol, tobacco, and illicit drugs 3) The patient's current medications and supplements 4) Functional ability including ADL's, fall risk, home safety risk, hearing and visual impairment 5) Diet and physical activities 6) Evidence for depression or mood disorder 7) The patient's height, weight, and BMI have been recorded in the chart     I have made referrals, and provided counseling and education based on review of the above    Other orders -     amLODIPine  Besylate; Take 1 tablet (5 mg total) by mouth daily.  Dispense: 30 tablet; Refill: 3 -     Atorvastatin  Calcium ; Take 1 tablet (80 mg total) by mouth daily.  Dispense: 30 tablet; Refill: 3 -     hydroCHLOROthiazide ; Take 1 tablet (25 mg  total) by mouth daily.  Dispense: 90 tablet; Refill: 3 -     Telmisartan ; Take 1 tablet (80 mg total) by mouth daily.  Dispense: 90 tablet; Refill: 3 -     Colchicine ; TAKE 2 TABLETS BY MOUTH IMMEDIATELY THEN TAKE 1 TABLET TWICE DAILY FOR  THE  DURATION  OF  THE  FLARE  UP  TO  A  MAXIMUM  OF  7  Dispense: 30 tablet; Refill: 0 -     Tetanus-Diphth-Acell Pertussis; Inject 0.5 mLs into the muscle once for 1 dose.  Dispense: 0.5 mL; Refill: 0    Return in about 6 months (around 07/23/2024).  Verneita LITTIE Kettering, MD

## 2024-01-23 NOTE — Patient Instructions (Addendum)
 YOUR urologist wanted you to return in October for a repeat PSA.  Please call to arrange this   Your uric acid level  Bs EING CHECKED TODAY . If it is over 6.0 and  you are having > 2 gout attacks per year, It may be worthwhile to you to take a daily medication called allopurinol to lower you uric acid level to < 6.0 , which is associated with fewer gout attacks.     You are DUE for your tetanus-diptheria-pertussis vaccine   (TDaP)   Please get this done at your pharmacy ;  it will be PAID FOR MY MEDICARE ONLY AT a PHARMACY  so I have sent it to [facility].  PLEASE CALL THEM FIRST TO MAKE SURE THEY WILL GIVE IT TO YOU SINCE YOU ARE NOT FILLING YOUR MEDICATIONS THERE  YOU MAY ALSO WANT TO CONSIDER GETTING THE SHINGLES VACCINE.   The ShingRx vaccine is  available in local pharmacies and is 97% EFFECTIVE IN PREVENTING SHINGLES IF YOU COMPLETE THE 2 SHOT SERIES  It is therefore ADVISED for all interested adults over 50 to prevent shingles.  . IF YOU  GET YOUR FIRST DOSE THIS MONTH , AND YOUR SECOND DOSE SHOULD NOT BE DONE BEFORE THE END OF FEBRUARY  IT MAY CAUSE FLU LIKE SYMPTOMS FOR UP TO 48 HOURS SO PLAN ACCORDING.

## 2024-01-24 ENCOUNTER — Encounter: Payer: Self-pay | Admitting: Internal Medicine

## 2024-01-24 ENCOUNTER — Ambulatory Visit: Payer: Self-pay | Admitting: Internal Medicine

## 2024-01-24 DIAGNOSIS — Z0001 Encounter for general adult medical examination with abnormal findings: Secondary | ICD-10-CM | POA: Insufficient documentation

## 2024-01-24 DIAGNOSIS — R202 Paresthesia of skin: Secondary | ICD-10-CM | POA: Insufficient documentation

## 2024-01-24 NOTE — Assessment & Plan Note (Signed)
 Advised to schedule follow up with urology as directed after April PSA had normalized.

## 2024-01-24 NOTE — Assessment & Plan Note (Addendum)
 Presumed by ultrasound changes and serologies negative for autoimmune causes of hepatitis in November 2021.  He was offered referral to liver specialist at time of workup but declined.  He has been following a low GI diet  Reducing alcohol use, , exercising and taking meds  but deferred the prescribed metformin . .LFTs are normal  but FIB 4 score is > 2. Will order Fibroscan and refer to GI   Lab Results  Component Value Date   ALT 26 01/21/2024   AST 31 01/21/2024   ALKPHOS 85 01/21/2024   BILITOT 1.1 01/21/2024   Fibrosis 4 Score = 2.01  Fib-4 interpretation is not validated for people under 35 or over 14 years of age. However, scores under 2.0 are generally considered low risk.

## 2024-01-24 NOTE — Assessment & Plan Note (Signed)
 Screening labs are normal  Lab Results  Component Value Date   VITAMINB12 574 01/23/2024   Lab Results  Component Value Date   HGBA1C 5.8 01/21/2024   Lab Results  Component Value Date   TSH 1.89 01/21/2024

## 2024-01-24 NOTE — Assessment & Plan Note (Signed)
 He has had 3 episodes in the last year and uric acid level is elevated.  Allopurinol offered  Lab Results  Component Value Date   LABURIC 7.7 01/23/2024

## 2024-01-24 NOTE — Assessment & Plan Note (Signed)

## 2024-02-20 ENCOUNTER — Encounter: Admitting: Internal Medicine

## 2024-03-05 ENCOUNTER — Other Ambulatory Visit (HOSPITAL_COMMUNITY): Payer: Self-pay

## 2024-03-05 ENCOUNTER — Telehealth: Payer: Self-pay

## 2024-03-05 MED ORDER — DIAZEPAM 10 MG PO TABS: ORAL_TABLET | ORAL | 1 refills | Status: AC

## 2024-03-05 NOTE — Telephone Encounter (Signed)
 Copied from CRM [id number]. Topic: Clinical - Medication Question >> Mar 05, 2024 11:35 AM Mesmerise C wrote: Reason for CRM: Patient inquiring if provider can fill diazepam  (VALIUM ) 10 MG tablet has a root canal went to the dentist would like it sent to [facility] [address] [place] - [address] [place] [address]

## 2024-03-05 NOTE — Addendum Note (Signed)
 Addended by: MARYLYNN VERNEITA CROME on: 03/05/2024 02:15 PM   Modules accepted: Orders

## 2024-04-22 ENCOUNTER — Ambulatory Visit: Admitting: Cardiovascular Disease
